# Patient Record
Sex: Female | Born: 1960 | Race: White | Hispanic: No | Marital: Married | Smoking: Current every day smoker
Health system: Southern US, Community
[De-identification: ages and names within clinical notes are randomized; demographics above are authoritative.]

## PROBLEM LIST (undated history)

## (undated) DIAGNOSIS — M1990 Unspecified osteoarthritis, unspecified site: Secondary | ICD-10-CM

## (undated) DIAGNOSIS — D259 Leiomyoma of uterus, unspecified: Secondary | ICD-10-CM

## (undated) DIAGNOSIS — K279 Peptic ulcer, site unspecified, unspecified as acute or chronic, without hemorrhage or perforation: Secondary | ICD-10-CM

## (undated) DIAGNOSIS — K219 Gastro-esophageal reflux disease without esophagitis: Secondary | ICD-10-CM

## (undated) HISTORY — DX: Leiomyoma of uterus, unspecified: D25.9

## (undated) HISTORY — PX: CHOLECYSTECTOMY: SHX55

## (undated) HISTORY — DX: Unspecified osteoarthritis, unspecified site: M19.90

## (undated) HISTORY — DX: Peptic ulcer, site unspecified, unspecified as acute or chronic, without hemorrhage or perforation: K27.9

## (undated) HISTORY — DX: Gastro-esophageal reflux disease without esophagitis: K21.9

## (undated) HISTORY — PX: ABDOMINAL HYSTERECTOMY: SHX81

---

## 2006-04-24 ENCOUNTER — Ambulatory Visit: Payer: Self-pay | Admitting: Family Medicine

## 2006-04-24 DIAGNOSIS — N951 Menopausal and female climacteric states: Secondary | ICD-10-CM | POA: Insufficient documentation

## 2006-04-24 DIAGNOSIS — R5381 Other malaise: Secondary | ICD-10-CM | POA: Insufficient documentation

## 2006-04-24 DIAGNOSIS — N943 Premenstrual tension syndrome: Secondary | ICD-10-CM | POA: Insufficient documentation

## 2006-04-24 DIAGNOSIS — R5383 Other fatigue: Secondary | ICD-10-CM

## 2006-04-24 DIAGNOSIS — M549 Dorsalgia, unspecified: Secondary | ICD-10-CM

## 2006-04-24 DIAGNOSIS — M5442 Lumbago with sciatica, left side: Secondary | ICD-10-CM | POA: Insufficient documentation

## 2006-04-25 ENCOUNTER — Encounter: Payer: Self-pay | Admitting: Family Medicine

## 2006-04-28 LAB — CONVERTED CEMR LAB
Albumin: 4.3 g/dL (ref 3.5–5.2)
Alkaline Phosphatase: 80 units/L (ref 39–117)
BUN: 12 mg/dL (ref 6–23)
Creatinine, Ser: 0.63 mg/dL (ref 0.40–1.20)
Glucose, Bld: 76 mg/dL (ref 70–99)
HCT: 43.4 % (ref 36.0–46.0)
HDL: 52 mg/dL (ref >39)
Hemoglobin: 14.3 g/dL (ref 12.0–15.0)
LDL Cholesterol: 106 mg/dL — ABNORMAL HIGH (ref 0–99)
MCHC: 32.9 g/dL (ref 30.0–36.0)
MCV: 96.4 fL (ref 78.0–100.0)
Potassium: 4.4 meq/L (ref 3.5–5.3)
RBC: 4.5 M/uL (ref 3.87–5.11)
RDW: 13.4 % (ref 11.5–14.0)
Total CHOL/HDL Ratio: 4
Triglycerides: 262 mg/dL — ABNORMAL HIGH (ref <150)

## 2006-07-01 ENCOUNTER — Ambulatory Visit: Payer: Self-pay | Admitting: Family Medicine

## 2006-07-01 DIAGNOSIS — M533 Sacrococcygeal disorders, not elsewhere classified: Secondary | ICD-10-CM | POA: Insufficient documentation

## 2007-03-13 ENCOUNTER — Ambulatory Visit: Payer: Self-pay | Admitting: Family Medicine

## 2008-07-07 ENCOUNTER — Encounter: Payer: Self-pay | Admitting: Family Medicine

## 2008-07-19 ENCOUNTER — Ambulatory Visit: Payer: Self-pay | Admitting: Family Medicine

## 2008-07-19 DIAGNOSIS — E781 Pure hyperglyceridemia: Secondary | ICD-10-CM | POA: Insufficient documentation

## 2008-07-19 DIAGNOSIS — R002 Palpitations: Secondary | ICD-10-CM | POA: Insufficient documentation

## 2008-07-19 LAB — CONVERTED CEMR LAB
Cholesterol, target level: 200 mg/dL
LDL Goal: 160 mg/dL

## 2008-07-21 ENCOUNTER — Telehealth: Payer: Self-pay | Admitting: Family Medicine

## 2008-08-05 ENCOUNTER — Encounter: Payer: Self-pay | Admitting: Family Medicine

## 2008-08-08 ENCOUNTER — Encounter: Payer: Self-pay | Admitting: Family Medicine

## 2008-08-30 ENCOUNTER — Encounter: Payer: Self-pay | Admitting: Family Medicine

## 2008-08-30 LAB — CONVERTED CEMR LAB
Cholesterol: 156 mg/dL
HDL: 29 mg/dL
LDL Cholesterol: 96 mg/dL

## 2008-09-07 DIAGNOSIS — G43009 Migraine without aura, not intractable, without status migrainosus: Secondary | ICD-10-CM | POA: Insufficient documentation

## 2008-09-15 ENCOUNTER — Encounter: Payer: Self-pay | Admitting: Family Medicine

## 2008-09-22 ENCOUNTER — Encounter: Payer: Self-pay | Admitting: Family Medicine

## 2008-10-04 ENCOUNTER — Telehealth: Payer: Self-pay | Admitting: Family Medicine

## 2008-10-05 ENCOUNTER — Encounter: Payer: Self-pay | Admitting: Family Medicine

## 2009-06-21 ENCOUNTER — Encounter: Payer: Self-pay | Admitting: Family Medicine

## 2009-08-24 ENCOUNTER — Ambulatory Visit: Payer: Self-pay | Admitting: Family Medicine

## 2009-08-24 DIAGNOSIS — G43829 Menstrual migraine, not intractable, without status migrainosus: Secondary | ICD-10-CM | POA: Insufficient documentation

## 2009-08-24 DIAGNOSIS — R062 Wheezing: Secondary | ICD-10-CM | POA: Insufficient documentation

## 2009-08-24 DIAGNOSIS — F17200 Nicotine dependence, unspecified, uncomplicated: Secondary | ICD-10-CM | POA: Insufficient documentation

## 2010-03-22 ENCOUNTER — Encounter: Payer: Self-pay | Admitting: Family Medicine

## 2010-03-23 ENCOUNTER — Encounter: Payer: Self-pay | Admitting: Family Medicine

## 2010-03-23 LAB — CONVERTED CEMR LAB: Pap Smear: NORMAL

## 2010-04-24 NOTE — Letter (Signed)
Summary: [facility]   Imported By: Lanelle Bal 06/29/2009 12:37:34  _____________________________________________________________________  External Attachment:    Type:   Image     Comment:   External Document

## 2010-04-24 NOTE — Assessment & Plan Note (Signed)
Summary: menstrual migraine   Vital Signs:  Patient profile:   50 year old female Height:      64.5 inches Weight:      162 pounds BMI:     27.48 O2 Sat:      100 % on Room air Pulse rate:   63 / minute BP sitting:   108 / 74  (left arm) Cuff size:   large  Vitals Entered By: Payton Spark CMA (August 24, 2009 8:34 AM)  O2 Flow:  Room air  Serial Vital Signs/Assessments:  Comments: Peak Flow [address] By: Payton Spark CMA   CC: F/U HA    Primary Care Provider:  Nani Gasser MD  CC:  F/U HA .  History of Present Illness: 50 yo WF presents for f/u menstrual migraines that really just started 2 yrs ago.  She has been seeing Dr Neale Burly at the [facility] in [place].  She was started on Maxalt for rescue.  She went back last month.  She was started on Topamax for prophylaxis.  He wants to get her off Maxalt because she smokes.  She started to feel bad on the Topamax.  She had 1 round of trigger point injections with lidocaine and decadron.  She goes back in 10 days.  She is just starting perimenopause.  She did not need to use her Maxalt with her period this month.  She did have wheezing on her lung exam last month.  She did have a cold in March.  She is a long time smoker.      Current Medications (verified): 1)  Multivitamins  Tabs (Multiple Vitamin) .... Take 1 Tablet By Mouth Once A Day 2)  Colace 100 Mg Caps (Docusate Sodium) .... Two By Mouth Daily 3)  Metamucil  Caps (Psyllium Caps) .... Two By Mouth Daily 4)  Vita-Plus E 400 Unit Caps (Vitamin E) .... Take 1 Tablet By Mouth Once A Day 5)  Vitamin C 1000 Mg Tabs (Ascorbic Acid) .... Take 1 Tablet By Mouth Once A Day 6)  Ra Cod Liver Oil 1250-133 Unit Caps (Vitamins A & D) .... Take 1 Tablet By Mouth Once A Day 7)  Vitamin B-6 100 Mg Tabs (Pyridoxine Hcl) .... Take 1 Tablet By Mouth Once A Day 8)  Flexeril 10 Mg Tabs (Cyclobenzaprine Hcl) .... One By Mouth At Night As Needed Back Pain. 9)  Prozac 20 Mg  Caps (Fluoxetine Hcl) .... Take 1 Tab By Mouth Once Daily 10)  Topamax 25 Mg Tabs (Topiramate) .... Take 3 Tabs By Mouth Daily 11)  Maxalt 10 Mg Tabs (Rizatriptan Benzoate) .... Take As Directed As Needed  Allergies (verified): No Known Drug Allergies  Past History:  Past Medical History: menstrual migraines - Dr Santiago Glad smoker  Social History: Reviewed history from 04/24/2006 and no changes required. 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  13 yrs education.  Married to Brain with 5 kids.   Current Smoker Alcohol use-yes Drug use-no Regular exercise-no  Review of Systems      See HPI  Physical Exam  General:  alert, well-developed, well-nourished, and well-hydrated.   Head:  normocephalic and atraumatic.   Eyes:  conjunctiva clear, PERRLA Nose:  no nasal discharge.   Mouth:  pharynx pink and moist.   Neck:  no masses.   Lungs:  Normal respiratory effort, chest expands symmetrically. Lungs are clear to auscultation, no crackles or wheezes.  + forced exp wheezing bibasilar Heart:  Normal rate  and regular rhythm. S1 and S2 normal without gallop, murmur, click, rub or other extra sounds. Skin:  color normal.   Cervical Nodes:  No lymphadenopathy noted   Impression & Recommendations:  Problem # 1:  MIGRAINE, MENSTRUAL (ICD-346.40) Assessment Improved She is to continue seeing Dr Neale Burly for next round of trigger point injections and continue on Topamax for prophylaxis.  She would be able to stay on Maxalt for rescue if she quits smoking according to Dr Neale Burly.   Her updated medication list for this problem includes:    Maxalt 10 Mg Tabs (Rizatriptan benzoate) .Marland Kitchen... Take as directed as needed  Problem # 2:  WHEEZING (ICD-786.07)  She has clear lungs on exam today but I did recommend that she quit smoking and use an Albuterol HFA as needed.   Orders: Peak Flow Rate (94150)  Problem # 3:  CIGARETTE SMOKER (ICD-305.1)  Her updated medication list for this  problem includes:    Chantix Starting Month Pak 0.5 Mg X 11 & 1 Mg X 42 Tabs (Varenicline tartrate) .Marland Kitchen... Take as directed  Complete Medication List: 1)  Multivitamins Tabs (Multiple vitamin) .... Take 1 tablet by mouth once a day 2)  Colace 100 Mg Caps (Docusate sodium) .... Two by mouth daily 3)  Metamucil Caps (Psyllium caps) .... Two by mouth daily 4)  Vita-plus E 400 Unit Caps (Vitamin e) .... Take 1 tablet by mouth once a day 5)  Vitamin C 1000 Mg Tabs (Ascorbic acid) .... Take 1 tablet by mouth once a day 6)  Ra Cod Liver Oil 1250-133 Unit Caps (Vitamins a & d) .... Take 1 tablet by mouth once a day 7)  Vitamin B-6 100 Mg Tabs (Pyridoxine hcl) .... Take 1 tablet by mouth once a day 8)  Flexeril 10 Mg Tabs (Cyclobenzaprine hcl) .... One by mouth at night as needed back pain. 9)  Prozac 20 Mg Caps (Fluoxetine hcl) .... Take 1 tab by mouth once daily 10)  Topamax 25 Mg Tabs (Topiramate) .... 3 tabs by mouth at bedtime 11)  Maxalt 10 Mg Tabs (Rizatriptan benzoate) .... Take as directed as needed 12)  Chantix Starting Month Pak 0.5 Mg X 11 & 1 Mg X 42 Tabs (Varenicline tartrate) .... Take as directed 13)  Proair Hfa 108 (90 Base) Mcg/act Aers (Albuterol sulfate) .... 2 puffs q 4 hrs prn  Patient Instructions: 1)  F/U with Dr Neale Burly for trigger point injections. 2)  Stay on Topamax at bedtime. 3)  Use Maxalt for rescue. 4)  You may want to take Aleve for menstrual cramps right at onset. 5)  Work on smoking cessation. 6)  Start Chantix 1 wk before your quit date. 7)  Use ProAir inhaler AS needed for wheezing, shortness of breathe. 8)  REturn for f/u smoking cessation in 3 mos. Prescriptions: CHANTIX STARTING MONTH PAK 0.5 MG X 11 & 1 MG X 42 TABS (VARENICLINE TARTRATE) take as directed  #1 pack x 0   Entered and Authorized by:   Seymour Bars DO   Signed by:   Seymour Bars DO on 08/24/2009   Method used:   Printed then faxed to ...       [facility]  [place] [phone]* (retail)       [address] [place], [place]  [address]       Ph: [phone] or [phone]       Fax: [phone]   RxID:   [id number]  PROAIR HFA 108 (90 BASE) MCG/ACT AERS (ALBUTEROL SULFATE) 2 puffs q 4 hrs prn  #1 x 0   Entered and Authorized by:   Seymour Bars DO   Signed by:   Seymour Bars DO on 08/24/2009   Method used:   Printed then faxed to ...       [facility]  [place] [phone][facility] (retail)       [address] [place], [place]  [address]       Ph: [phone] or [phone]       Fax: [phone]   RxID:   [id number] CHANTIX STARTING MONTH PAK 0.5 MG X 11 & 1 MG X 42 TABS (VARENICLINE TARTRATE) take as directed  #1 pack x 0   Entered and Authorized by:   Seymour Bars DO   Signed by:   Seymour Bars DO on 08/24/2009   Method used:   Electronically to        [facility] [phone]* (retail)       [address] [place], [place]  [address]       Ph: [phone] or [phone]       Fax: [phone]   RxID:   [id number]

## 2010-04-26 NOTE — Miscellaneous (Signed)
Summary: pap  Clinical Lists Changes  Observations: Added new observation of PAP DUE: 03/2012 (03/23/2010 15:49) Added new observation of PAP SMEAR: Normal (03/23/2010 15:49)    Pap smear normal - to return for  PAP in two years.[id number]

## 2010-04-26 NOTE — Letter (Signed)
Summary: [facility]  [facility]   Imported By: Lanelle Bal 04/04/2010 09:01:02  _____________________________________________________________________  External Attachment:    Type:   Image     Comment:   External Document

## 2011-05-20 ENCOUNTER — Encounter: Payer: Self-pay | Admitting: Physician Assistant

## 2011-05-20 ENCOUNTER — Ambulatory Visit (INDEPENDENT_AMBULATORY_CARE_PROVIDER_SITE_OTHER): Payer: BC Managed Care – PPO | Admitting: Physician Assistant

## 2011-05-20 VITALS — BP 115/77 | HR 73 | Temp 98.0°F | Ht 64.0 in | Wt 161.0 lb

## 2011-05-20 DIAGNOSIS — R1013 Epigastric pain: Secondary | ICD-10-CM

## 2011-05-20 NOTE — Progress Notes (Signed)
  Subjective:    Patient ID: Anne Mcdonald, female    DOB: 11/10/1960, 51 y.o.   MRN: [id number]  HPI Patient presents to clinic with abdominal pain. At first is was off and on for like 2 weeks and not very bad. 7 days ago it started to be more contstant and the pain was worse. She described the pain as burning dull 6/10 pain and almost like it's in between her breast. She has noticed that she has been burping more than usual but has not noticed any bad taste in her mouth. It has been better when she took Zantac but does not make it go away. Her bowel movements are been hard lately and she has to take a stool softener. She denies blood in stool. She does not have her gallbladder it was removed. She has not had pain like this before or ever been diagnosed with acid reflux.    Review of Systems     Objective:   Physical Exam  Constitutional: She is oriented to person, place, and time. She appears well-developed and well-nourished.  HENT:  Head: Normocephalic and atraumatic.  Cardiovascular: Normal rate, regular rhythm and normal heart sounds.   Pulmonary/Chest: Effort normal and breath sounds normal.  Abdominal:       Abdomen soft with normal bowel sounds. Tenderness to deep palpation in epigastric area. No other tenderness. No organomegly.   Neurological: She is alert and oriented to person, place, and time.  Psychiatric: She has a normal mood and affect. Her behavior is normal.          Assessment & Plan:  Epigastric pain- Suspect GERD will treat with Dexilant samples to see if pain and discomfort improve. Instructed to avoid smoking, alcohol,NSAIDs, spicy foods and caffeine. Patient is to call office if not improving or when need prescription called in if working. Discuss options to quit smoking. Patient reports she is not ready. Talked about the possibilty of ulcer. If pain continues we could consider testing for h.pylori.  We discussed need for CPE, Pap, Tdap and other health  maintence items.

## 2011-05-20 NOTE — Patient Instructions (Signed)
Star Dexilant daily. Gave samples. Call when get low and can call in refill. See handouts and limit smoking, avoid caffiene and alcohol. STay away from anti-inflammatories.   Gastroesophageal Reflux Disease, Adult Gastroesophageal reflux disease (GERD) happens when acid from your stomach flows up into the esophagus. When acid comes in contact with the esophagus, the acid causes soreness (inflammation) in the esophagus. Over time, GERD may create small holes (ulcers) in the lining of the esophagus. CAUSES   Increased body weight. This puts pressure on the stomach, making acid rise from the stomach into the esophagus.   Smoking. This increases acid production in the stomach.   Drinking alcohol. This causes decreased pressure in the lower esophageal sphincter (valve or ring of muscle between the esophagus and stomach), allowing acid from the stomach into the esophagus.   Late evening meals and a full stomach. This increases pressure and acid production in the stomach.   A malformed lower esophageal sphincter.  Sometimes, no cause is found. SYMPTOMS   Burning pain in the lower part of the mid-chest behind the breastbone and in the mid-stomach area. This may occur twice a week or more often.   Trouble swallowing.   Sore throat.   Dry cough.   Asthma-like symptoms including chest tightness, shortness of breath, or wheezing.  DIAGNOSIS  Your caregiver may be able to diagnose GERD based on your symptoms. In some cases, X-rays and other tests may be done to check for complications or to check the condition of your stomach and esophagus. TREATMENT  Your caregiver may recommend over-the-counter or prescription medicines to help decrease acid production. Ask your caregiver before starting or adding any new medicines.  HOME CARE INSTRUCTIONS   Change the factors that you can control. Ask your caregiver for guidance concerning weight loss, quitting smoking, and alcohol consumption.   Avoid foods  and drinks that make your symptoms worse, such as:   Caffeine or alcoholic drinks.   Chocolate.   Peppermint or mint flavorings.   Garlic and onions.   Spicy foods.   Citrus fruits, such as oranges, lemons, or limes.   Tomato-based foods such as sauce, chili, salsa, and pizza.   Fried and fatty foods.   Avoid lying down for the 3 hours prior to your bedtime or prior to taking a nap.   Eat small, frequent meals instead of large meals.   Wear loose-fitting clothing. Do not wear anything tight around your waist that causes pressure on your stomach.   Raise the head of your bed 6 to 8 inches with wood blocks to help you sleep. Extra pillows will not help.   Only take over-the-counter or prescription medicines for pain, discomfort, or fever as directed by your caregiver.   Do not take aspirin, ibuprofen, or other nonsteroidal anti-inflammatory drugs (NSAIDs).  SEEK IMMEDIATE MEDICAL CARE IF:   You have pain in your arms, neck, jaw, teeth, or back.   Your pain increases or changes in intensity or duration.   You develop nausea, vomiting, or sweating (diaphoresis).   You develop shortness of breath, or you faint.   Your vomit is green, yellow, black, or looks like coffee grounds or blood.   Your stool is red, bloody, or black.  These symptoms could be signs of other problems, such as heart disease, gastric bleeding, or esophageal bleeding. MAKE SURE YOU:   Understand these instructions.   Will watch your condition.   Will get help right away if you are not doing well  or get worse.  Document Released: 12/19/2004 Document Revised: 11/21/2010 Document Reviewed: 09/28/2010 [facility] Patient Information 2012 [place], [place]  .Ulcer Disease You have an ulcer. This may be in your stomach (gastric ulcer) or in the first part of your small bowel, the duodenum (duodenal ulcer). An ulcer is a break in the lining of the stomach or duodenum. The ulcer causes erosion into the deeper  tissue. CAUSES  The stomach has a lining to protect itself from the acid that digests food. The lining can be damaged in two main ways:  The Helico Pylori bacteria (H. Pyolori) can infect the lining of the stomach and cause ulcers.   Nonsteroidal, anti-inflammatory medications (NSAIDS) can cause gastric ulcerations.   Smoking tobacco can increase the acid in the stomach. This can lead to ulcers, and will impair healing of ulcers.  Other factors, such as alcohol use and stress may contribute to ulcer formation. Rarely, a tumor or cancer can cause an ulcer.  SYMPTOMS  The problems (symptoms) of ulcer disease are usually a burning or gnawing of the mid-upper belly (abdomen). This is often worse on an empty stomach and may get better with food. This may be associated with feeling sick to your stomach (nausea), bloating, and vomiting. If the ulcer results in bleeding, it can cause:  Black, tarry stools.   Vomiting of bright red blood.   Vomiting coffee-ground-looking materials.  With severe bleeding, there may be loss of consciousness and shock.  DIAGNOSIS  Learning what is wrong (diagnosis) is usually made based upon your history and an exam. Medications are so effective that further tests may not be necessary. If needed, other tests may include:  Blood tests, x-rays, or heart tests. These are done to be sure that no other conditions are causing your symptoms.   X-rays (Barium studies) such as an upper GI series.   Most commonly, an upper GI endoscopy can confirm your diagnosis. This is when a flexible tube is passed through the mouth (using sedative medication). The tube is used to look at the inside of esophagus, stomach, and small bowel. Abnormal pieces of tissue may be removed to examine under the microscope (biopsy).  TREATMENT  Bleeding from ulcers can usually be treated via endoscopy. Rarely, surgery is needed for ulcers when bleeding cannot be stopped. Surgery is needed if the ulcer  goes through the wall of the stomach or duodenum.  After any bleeding is stopped, medications are the main treatment:  If your ulcer was caused by the bacteria H. Pylori, then you will need antibiotics to kill the infection. Usually, a program involving more than one antibiotic, and a medicine for stomach acid, is prescribed.   Medicine to decrease acid production is used for almost all ulcers. Your caregiver will make a recommendation. They will also tell you how long you must use the medication.   Stop using any medications or substances that may have contributed to your ulcer (alcohol, tobacco, caffeine, for example).   Medications are available that protect the lining of the bowel.  HOME CARE INSTRUCTIONS   Continue regular work and usual activities unless advised otherwise by your caregiver.   Avoid tobacco, alcohol, and caffeine. Tobacco use will decrease and slow the rates of healing.   Avoid foods that seem to aggravate or cause discomfort.   There are many over-the-counter products available to control stomach acid and other symptoms. Discuss these with your caregiver before using them. DO NOT substitute over-the-counter medications for prescription medications without discussing  with your caregiver.   Special diets are not usually needed.   Keep any follow-up appointments and blood tests, as directed.  SEEK MEDICAL CARE IF:   Your pain or other ulcer symptoms do not improve within a few days of starting treatment.   You develop diarrhea. This can be a complication of certain treatments.   You have ongoing indigestion or heartburn, even if your main ulcer symptoms are improved.  SEEK IMMEDIATE MEDICAL CARE IF:   You develop bright red, rectal bleeding; dark black, tarry stools; or vomit blood.   You become light-headed, weak, have fainting episodes, or become sweaty, cold and clammy.   You experience severe abdominal pain not controlled by medications. DO NOT take pain  medications unless ordered by your caregiver.  Document Released: 12/19/2004 Document Revised: 11/21/2010 Document Reviewed: 11/06/2006 [facility] Patient Information 2012 [place], [place].

## 2011-05-24 ENCOUNTER — Encounter: Payer: Self-pay | Admitting: Physician Assistant

## 2011-05-24 ENCOUNTER — Ambulatory Visit (INDEPENDENT_AMBULATORY_CARE_PROVIDER_SITE_OTHER): Payer: BC Managed Care – PPO | Admitting: Physician Assistant

## 2011-05-24 ENCOUNTER — Other Ambulatory Visit: Payer: Self-pay | Admitting: Physician Assistant

## 2011-05-24 VITALS — BP 103/71 | HR 65 | Temp 98.2°F | Ht 64.0 in | Wt 158.0 lb

## 2011-05-24 DIAGNOSIS — R5383 Other fatigue: Secondary | ICD-10-CM

## 2011-05-24 DIAGNOSIS — R5381 Other malaise: Secondary | ICD-10-CM

## 2011-05-24 DIAGNOSIS — R1013 Epigastric pain: Secondary | ICD-10-CM

## 2011-05-24 LAB — CBC WITH DIFFERENTIAL/PLATELET
Basophils Absolute: 0 10*3/uL (ref 0.0–0.1)
Eosinophils Relative: 2 % (ref 0–5)
HCT: 40.6 % (ref 36.0–46.0)
Hemoglobin: 13.8 g/dL (ref 12.0–15.0)
Lymphocytes Relative: 32 % (ref 12–46)
MCV: 95.8 fL (ref 78.0–100.0)
Monocytes Absolute: 0.6 10*3/uL (ref 0.1–1.0)
Monocytes Relative: 6 % (ref 3–12)
RDW: 12.9 % (ref 11.5–15.5)
WBC: 9.4 10*3/uL (ref 4.0–10.5)

## 2011-05-24 MED ORDER — SUCRALFATE 1 G PO TABS: 1.0000 g | ORAL_TABLET | Freq: Four times a day (QID) | ORAL | Status: DC

## 2011-05-24 NOTE — Progress Notes (Signed)
  Subjective:    Patient ID: Anne Mcdonald, female    DOB: Aug 31, 1960, 51 y.o.   MRN: [id number]  HPI Patient continues to have epigastric pain. She denies any nausea or vomiting. She feels like she can't eat but last night she ate and the pain was better for a little bit. She has tried to avoid spicy foods and caffiene but still smokes. She describes the pain as contstant 6/10 like someone punching her in the stomach. Nothing has made it worse. She has taken dexilant but doesn't seem to help. She is very worn out and tired. She doesn't feel like getting out of bed.   Review of Systems     Objective:   Physical Exam  Constitutional: She is oriented to person, place, and time. She appears well-developed and well-nourished.  Cardiovascular: Normal rate, regular rhythm and normal heart sounds.   Pulmonary/Chest: Effort normal and breath sounds normal.  Abdominal: Soft. Bowel sounds are normal. There is tenderness.       Epigastric pain with deep palpation.   Neurological: She is alert and oriented to person, place, and time.  Psychiatric: She has a normal mood and affect. Her behavior is normal.          Assessment & Plan:  Epigastric pain/Fatigue- Lipase,CBC, h.pylori ordered. Will call with results. If normal will order a CT scan of abdomen. Carafate was added to Dexilant daily. Tylenol for pain. Patient cannot tolerate nacotics.

## 2011-05-24 NOTE — Patient Instructions (Signed)
Take Carafate four times a day along with the dexilant once a day. Get labs drawn. Will call Monday with results. Tylenol for abdominal pain.

## 2011-05-27 ENCOUNTER — Other Ambulatory Visit: Payer: Self-pay | Admitting: Physician Assistant

## 2011-05-27 DIAGNOSIS — R1013 Epigastric pain: Secondary | ICD-10-CM

## 2011-05-29 ENCOUNTER — Telehealth: Payer: Self-pay | Admitting: *Deleted

## 2011-05-29 NOTE — Telephone Encounter (Addendum)
[facility] wants to know if you want the CT of abd or abd and pelvis. What do you want to rule out?

## 2011-05-29 NOTE — Telephone Encounter (Signed)
CT of abdomen only. Want to evaluate for any intestinal/stomach masses causing epigastric pain.

## 2011-05-30 NOTE — Telephone Encounter (Signed)
Informed by [facility] that pt cancelled CT scan stating that she would do it at a later date.

## 2011-05-31 ENCOUNTER — Other Ambulatory Visit: Payer: BC Managed Care – PPO

## 2011-06-03 ENCOUNTER — Telehealth: Payer: Self-pay | Admitting: *Deleted

## 2011-06-03 MED ORDER — OMEPRAZOLE 20 MG PO CPDR: 20.0000 mg | DELAYED_RELEASE_CAPSULE | Freq: Every day | ORAL | Status: DC

## 2011-06-03 NOTE — Telephone Encounter (Signed)
Husband informed

## 2011-06-03 NOTE — Telephone Encounter (Signed)
Pt states that she needs a refill on Dexilant. What dose needs to be sent to the pharmacy? please advise.

## 2011-06-03 NOTE — Telephone Encounter (Signed)
Dexilant is really expensive and insurance will not pay for it unless you try and fail omeprazole. Sent to pharmacy. Call patient and let her know.

## 2012-02-11 ENCOUNTER — Encounter (INDEPENDENT_AMBULATORY_CARE_PROVIDER_SITE_OTHER): Payer: BC Managed Care – PPO | Admitting: Family Medicine

## 2012-02-11 NOTE — Progress Notes (Signed)
  Subjective:    Patient ID: Anne Mcdonald, female    DOB: [birth year], 51 y.o.   MRN: [id number]  HPI    Review of Systems     Objective:   Physical Exam        Assessment & Plan:

## 2012-03-11 ENCOUNTER — Telehealth: Payer: Self-pay | Admitting: *Deleted

## 2012-03-11 ENCOUNTER — Emergency Department (INDEPENDENT_AMBULATORY_CARE_PROVIDER_SITE_OTHER): Payer: BC Managed Care – PPO

## 2012-03-11 ENCOUNTER — Encounter: Payer: Self-pay | Admitting: *Deleted

## 2012-03-11 ENCOUNTER — Emergency Department
Admission: EM | Admit: 2012-03-11 | Discharge: 2012-03-11 | Disposition: A | Discharge status: Home / Self Care | Payer: BC Managed Care – PPO | Source: Home / Self Care | Attending: Family Medicine | Admitting: Family Medicine

## 2012-03-11 DIAGNOSIS — R0602 Shortness of breath: Secondary | ICD-10-CM

## 2012-03-11 DIAGNOSIS — R05 Cough: Secondary | ICD-10-CM

## 2012-03-11 DIAGNOSIS — F17200 Nicotine dependence, unspecified, uncomplicated: Secondary | ICD-10-CM

## 2012-03-11 DIAGNOSIS — R0989 Other specified symptoms and signs involving the circulatory and respiratory systems: Secondary | ICD-10-CM

## 2012-03-11 DIAGNOSIS — J069 Acute upper respiratory infection, unspecified: Secondary | ICD-10-CM

## 2012-03-11 DIAGNOSIS — Z716 Tobacco abuse counseling: Secondary | ICD-10-CM

## 2012-03-11 DIAGNOSIS — R059 Cough, unspecified: Secondary | ICD-10-CM

## 2012-03-11 MED ORDER — PREDNISONE 50 MG PO TABS: ORAL_TABLET | ORAL | Status: DC

## 2012-03-11 MED ORDER — METHYLPREDNISOLONE ACETATE 80 MG/ML IJ SUSP
80.0000 mg | Freq: Once | INTRAMUSCULAR | Status: AC
  Administered 2012-03-11: 80 mg via INTRAMUSCULAR

## 2012-03-11 MED ORDER — AZITHROMYCIN 250 MG PO TABS: ORAL_TABLET | ORAL | Status: DC

## 2012-03-11 NOTE — ED Notes (Signed)
Pt c/o cough, sore throat, HA, fatigue and night sweats.

## 2012-03-11 NOTE — ED Provider Notes (Signed)
History     CSN: [id number]  Arrival date & time 03/11/12  1529   First MD Initiated Contact with Patient 03/11/12 1530      No chief complaint on file.  HPI  URI Symptoms Onset: 7 days  Description: generalized malaise,rhinorrhea, nasal congestion, diarrhea, cough, new onset SOB. No chest pain  Modifying factors:  1/2 PPD smoker; 30 pack year smoking history. Pt also reports some mild bilateral calf pain. L>R. Pt states that she stands on her feet all day and has also noticed this over the last   Symptoms Nasal discharge: yes Fever: no Sore throat: resolved  Cough: yes Wheezing: no  Ear pain: no GI symptoms: mild nausea  Sick contacts: yes; at work   [employer]  Stiff neck: no Dyspnea: yes Rash: no Swallowing difficulty: no  Sinusitis Risk Factors Headache/face pain: no Double sickening: no tooth pain: no  Allergy Risk Factors Sneezing: no Itchy scratchy throat: no Seasonal symptoms: no  Flu Risk Factors Headache: no muscle aches: mild severe fatigue: no   No past medical history on file.  No past surgical history on file.  No family history on file.  History  Substance Use Topics  . Smoking status: Current Every Day Smoker -- 1.0 packs/day    Types: Cigarettes  . Smokeless tobacco: Not on file  . Alcohol Use: Not on file    OB History    Grav Para Term Preterm Abortions TAB SAB Ect Mult Living                  Review of Systems  All other systems reviewed and are negative.    Allergies  Review of patient's allergies indicates no known allergies.  Home Medications   Current Outpatient Rx  Name  Route  Sig  Dispense  Refill  . FLUOXETINE HCL 20 MG PO CAPS   Oral   Take 20 mg by mouth daily.         Marland Kitchen METOPROLOL SUCCINATE ER 25 MG PO TB24   Oral   Take 12.5 mg by mouth daily.         Marland Kitchen OMEPRAZOLE 20 MG PO CPDR   Oral   Take 1 capsule (20 mg total) by mouth daily.   30 capsule   5   . SUCRALFATE 1 G PO TABS   Oral  Take 1 tablet (1 g total) by mouth 4 (four) times daily.   120 tablet   1   . TOPIRAMATE 100 MG PO TABS   Oral   Take 100 mg by mouth daily.           There were no vitals taken for this visit.  Physical Exam  Vitals reviewed. Constitutional: She appears well-developed and well-nourished.  HENT:  Head: Normocephalic and atraumatic.  Right Ear: External ear normal.  Left Ear: External ear normal.       +nasal erythema, rhinorrhea bilaterally, + post oropharyngeal erythema    Eyes: Conjunctivae normal are normal. Pupils are equal, round, and reactive to light.  Neck: Normal range of motion. Neck supple.  Cardiovascular: Normal rate, regular rhythm and normal heart sounds.   Pulmonary/Chest: Effort normal and breath sounds normal.  Abdominal: Soft.  Musculoskeletal: Normal range of motion.       Calves symmetric.  No popliteal tenderness  Lymphadenopathy:    She has no cervical adenopathy.  Neurological: She is alert.    ED Course  Procedures (including critical care time)  Labs Reviewed -  No data to display Dg Chest 2 View  03/11/2012  *RADIOLOGY REPORT*  Clinical Data: Cough and congestion.  Short of breath  CHEST - 2 VIEW  Comparison: None  Findings: Lungs are clear without infiltrate or effusion.  Heart size is normal and there is no heart failure or mass lesion.  IMPRESSION: Negative   Original Report Authenticated By: Janeece Riggers, M.D.   EKG: normal EKG, normal sinus rhythm.    1. URI (upper respiratory infection)   2. Tobacco abuse counseling       MDM  Suspect underlying viral process with secondary mild obstructive lung disease exacerbation.  Depomedrol 80mg  IM x1. CXR negative for focal infiltrate.  EKG NSR.  Rapid flu negative.  Pt does report some mild sxs of DVT with LE tenderness, though more diffuse than true popliteal tenderness/swelling as well as having no popliteal tenderness on my exam today or asymmtery. Wells score 0-3.  Will check  D-dimer.  If positive, proceed to LE dopplers and CTA.  General infectious and resp red flags reviewed.      The patient and/or caregiver has been counseled thoroughly with regard to treatment plan and/or medications prescribed including dosage, schedule, interactions, rationale for use, and possible side effects and they verbalize understanding. Diagnoses and expected course of recovery discussed and will return if not improved as expected or if the condition worsens. Patient and/or caregiver verbalized understanding.                Doree Albee, MD 03/12/12 [phone]

## 2012-04-14 ENCOUNTER — Encounter: Payer: Self-pay | Admitting: Family Medicine

## 2012-04-14 ENCOUNTER — Ambulatory Visit (INDEPENDENT_AMBULATORY_CARE_PROVIDER_SITE_OTHER): Payer: BC Managed Care – PPO | Admitting: Family Medicine

## 2012-04-14 VITALS — BP 99/64 | HR 85 | Temp 98.3°F | Wt 151.0 lb

## 2012-04-14 DIAGNOSIS — R1013 Epigastric pain: Secondary | ICD-10-CM

## 2012-04-14 MED ORDER — OMEPRAZOLE 40 MG PO CPDR: 40.0000 mg | DELAYED_RELEASE_CAPSULE | Freq: Every day | ORAL | Status: DC

## 2012-04-14 NOTE — Progress Notes (Signed)
CC: Anne Mcdonald is a 52 y.o. female is here for stomach issues   Subjective: HPI:  Patient reports epigastric pain. He has been present for one to 2 weeks. Becoming more noticeable on a daily basis. Described as mild in severity. Worse with an empty stomach, improved significantly with eating. It does not seem to be related to bowel movements. When pain is at its worst she has some mild nausea. Pain can occur anytime of the day depending on eating habits. She describes the pain as a burning that is nonradiating. She denies fevers, chills, vomiting, dysphagia, regurgitation, pain awakening from sleep, right upper quadrant pain, constipation, diarrhea, back pain. Denies chest pain rapid heart beat nor shortness of breath. Denies tar colored stools Interestingly she states that this pain is identical to that which responded tremendously to omeprazole, she's been out of omeprazole for 3-4 weeks.. she does not use nonsteroidal anti-inflammatories in excess, she is a smoker, rare alcohol use   Review Of Systems Outlined In HPI  Past Medical History  Diagnosis Date  . PUD (peptic ulcer disease)      No family history on file.   History  Substance Use Topics  . Smoking status: Current Every Day Smoker -- 1.0 packs/day    Types: Cigarettes  . Smokeless tobacco: Not on file  . Alcohol Use: Yes     Objective: Filed Vitals:   04/14/12 1621  BP: 99/64  Pulse: 85  Temp: 98.3 F (36.8 C)    General: Alert and Oriented, No Acute Distress HEENT: Pupils equal, round, reactive to light. Conjunctivae clear.  Moist mucous membranes,  Lungs: Clear to auscultation bilaterally, no wheezing/ronchi/rales.  Comfortable work of breathing. Good air movement. Cardiac: Regular rate and rhythm. Normal S1/S2.  No murmurs, rubs, nor gallops.   Abdomen: Mild epigastric pain to palpation, no right upper quadrant pain, no right lower nor left lower quadrant nor left upper quadrant pain. Normal bowel sounds  and no palpable masses Extremities: No peripheral edema.  Strong peripheral pulses.  Mental Status: No depression, anxiety, nor agitation. Skin: Warm and dry.  Assessment & Plan: Anne Mcdonald was seen today for stomach issues.  Diagnoses and associated orders for this visit:  Epigastric pain - omeprazole (PRILOSEC) 40 MG capsule; Take 1 capsule (40 mg total) by mouth daily.    Epigastric pain: [name] a self-reported history of peptic ulcer disease. Symptoms are suggestive of gastritis or recurrent peptic ulcer disease, H. Pylori screen last year was negative, will resume omeprazole at full dose and we'll withhold further testing in less not improved in one to 2 weeks.  Return in about 2 weeks (around 04/28/2012).

## 2012-05-18 ENCOUNTER — Encounter: Payer: Self-pay | Admitting: *Deleted

## 2012-05-21 ENCOUNTER — Other Ambulatory Visit: Payer: Self-pay | Admitting: Physician Assistant

## 2012-06-10 ENCOUNTER — Ambulatory Visit (INDEPENDENT_AMBULATORY_CARE_PROVIDER_SITE_OTHER): Payer: BC Managed Care – PPO

## 2012-06-10 ENCOUNTER — Ambulatory Visit (INDEPENDENT_AMBULATORY_CARE_PROVIDER_SITE_OTHER): Payer: BC Managed Care – PPO | Admitting: Family Medicine

## 2012-06-10 ENCOUNTER — Encounter: Payer: Self-pay | Admitting: Family Medicine

## 2012-06-10 VITALS — BP 99/63 | HR 68 | Ht 64.0 in | Wt 156.0 lb

## 2012-06-10 DIAGNOSIS — R0789 Other chest pain: Secondary | ICD-10-CM

## 2012-06-10 DIAGNOSIS — R079 Chest pain, unspecified: Secondary | ICD-10-CM

## 2012-06-10 DIAGNOSIS — R002 Palpitations: Secondary | ICD-10-CM

## 2012-06-10 DIAGNOSIS — I471 Supraventricular tachycardia: Secondary | ICD-10-CM | POA: Insufficient documentation

## 2012-06-10 DIAGNOSIS — R05 Cough: Secondary | ICD-10-CM

## 2012-06-10 DIAGNOSIS — R059 Cough, unspecified: Secondary | ICD-10-CM

## 2012-06-10 DIAGNOSIS — J209 Acute bronchitis, unspecified: Secondary | ICD-10-CM

## 2012-06-10 NOTE — Progress Notes (Signed)
Subjective:    Patient ID: Anne Mcdonald, female    DOB: 22-Aug-1960, 52 y.o.   MRN: [id number]  HPI Starting in late December the had a virus and had her periods and felt her heart was doing funny things.  Went to UC and was put on ABX and got better and heart settled down. Then started having palpitations. [facility] upped her topamax to 200mg  around that time. Also upped her omeprazole to 40mg  as well.   Says heart races and then pounding. Says it seemed to be worse at night when she would lay down. She then cut out her caffiene but not change.  Called her cards in early March but they couldn't see her until 3/27, so they ordered heart monitor. She did have some episodes with the monitor on. History of SVT seen on previous event recorder in 2010.  URI x 2 days.  No fever.  Some cough and mild ST.  NO NASAL congestion. Productive cough this AM.  + sick contacts. Chest feels tight.  No cough cold meds.   Review of Systems No SOB    BP 99/63  Pulse 68  Ht 5\' 4"  (1.626 m)  Wt 156 lb (70.761 kg)  BMI 26.76 kg/m2  SpO2 98%  LMP 05/20/2012    No Known Allergies  Past Medical History  Diagnosis Date  . PUD (peptic ulcer disease)     Past Surgical History  Procedure Laterality Date  . Cholecystectomy    . Cesarean section      History   Social History  . Marital Status: Married    Spouse Name: N/A    Number of Children: N/A  . Years of Education: N/A   Occupational History  . Not on file.   Social History Main Topics  . Smoking status: Current Every Day Smoker -- 1.00 packs/day    Types: Cigarettes  . Smokeless tobacco: Not on file  . Alcohol Use: Yes  . Drug Use: No  . Sexually Active: Not on file   Other Topics Concern  . Not on file   Social History Narrative  . No narrative on file    No family history on file.  Outpatient Encounter Prescriptions as of 06/10/2012  Medication Sig Dispense Refill  . FLUoxetine (PROZAC) 20 MG capsule Take 20 mg by  mouth daily.      . metoprolol succinate (TOPROL-XL) 25 MG 24 hr tablet Take 12.5 mg by mouth daily.      Marland Kitchen omeprazole (PRILOSEC) 40 MG capsule Take 1 capsule (40 mg total) by mouth daily.  30 capsule  3  . topiramate (TOPAMAX) 100 MG tablet Take 100 mg by mouth daily.      . [DISCONTINUED] omeprazole (PRILOSEC) 20 MG capsule TAKE 1 CAPSULE (20 MG TOTAL) BY MOUTH DAILY.  30 capsule  5   No facility-administered encounter medications on file as of 06/10/2012.          Objective:   Physical Exam  Constitutional: She is oriented to person, place, and time. She appears well-developed and well-nourished.  HENT:  Head: Normocephalic and atraumatic.  Right Ear: External ear normal.  Left Ear: External ear normal.  Nose: Nose normal.  Mouth/Throat: Oropharynx is clear and moist.  TMs and canals are clear.   Eyes: Conjunctivae and EOM are normal. Pupils are equal, round, and reactive to light.  Neck: Neck supple. No thyromegaly present.  Cardiovascular: Normal rate, regular rhythm and normal heart sounds.   Pulmonary/Chest:  Effort normal and breath sounds normal. She has no wheezes.  Lymphadenopathy:    She has no cervical adenopathy.  Neurological: She is alert and oriented to person, place, and time.  Skin: Skin is warm and dry.  Psychiatric: She has a normal mood and affect.          Assessment & Plan:  Palpitations - most likely a recurrence of her SVT. She is Re: cut her Topamax down to previous dose and I encouraged her to try cutting her omeprazole back down to 20 mg, I do not feel that this is the cause but certainly could be a contributing factor so I feel like if we drop her medicines back to her previous baseline we can see if her symptoms resolve. The meantime we will check for thyroid disorder, anemia, electrical disturbance, and general inflammation with sedimentation rate. We will call once these are available. If everything looks normal and her repeat event monitor is  consistent with SVT did recommend increasing her metoprolol for rate control. We have called the cardiologist's office to check a copy of the report. They did fax [place] her previous note from April 2013, from when she was having similar symptoms.  Bronchitis- likely viral but she c/o of chest tightness and pain on the left posterior lung this AM.  + Smoker. Because of chest tightness today I recommend chest x-ray for further evaluation but I suspect that this is most likely a viral illness. Hold off on antibiotics at this time she has no sinusitis type symptoms or fever.   GERD- sxs improving on prilosec 40mg .

## 2012-06-10 NOTE — Patient Instructions (Addendum)
We will call you with your lab results. If you don't here from [place] by Friday then please give [place] a call at [phone].

## 2012-06-11 LAB — COMPLETE METABOLIC PANEL WITH GFR
CO2: 26 mEq/L (ref 19–32)
Calcium: 9.8 mg/dL (ref 8.4–10.5)
Chloride: 108 mEq/L (ref 96–112)
GFR, Est African American: >89 mL/min
GFR, Est Non African American: >89 mL/min
Glucose, Bld: 86 mg/dL (ref 70–99)
Sodium: 139 mEq/L (ref 135–145)
Total Bilirubin: 0.6 mg/dL (ref 0.3–1.2)
Total Protein: 6.3 g/dL (ref 6.0–8.3)

## 2012-06-11 LAB — CBC WITH DIFFERENTIAL/PLATELET
Basophils Absolute: 0 10*3/uL (ref 0.0–0.1)
Basophils Relative: 0 % (ref 0–1)
Eosinophils Absolute: 0.2 10*3/uL (ref 0.0–0.7)
Eosinophils Relative: 2 % (ref 0–5)
Lymphs Abs: 2.1 10*3/uL (ref 0.7–4.0)
MCH: 32 pg (ref 26.0–34.0)
MCHC: 33.9 g/dL (ref 30.0–36.0)
Neutrophils Relative %: 71 % (ref 43–77)
Platelets: 299 10*3/uL (ref 150–400)
RBC: 4.16 MIL/uL (ref 3.87–5.11)
RDW: 13.7 % (ref 11.5–15.5)

## 2012-06-11 LAB — FERRITIN: Ferritin: 18 ng/mL (ref 10–291)

## 2012-06-12 ENCOUNTER — Telehealth: Payer: Self-pay | Admitting: Family Medicine

## 2012-06-12 NOTE — Telephone Encounter (Signed)
Please call patient and let her know that I finally got her interpretation of her Holter monitor. Evidently she was in sinus rhythm. She did have a maximum heart rate of 140 but had extremely rare PVCs, which are early beats. Her times of heart racing did not correspond to any of the rhythm abnormalities and thus they had recommended she followup with me to make sure there was nothing else going on and to check basic blood work. Her blood work is completely normal. Thus I don't have a great explanation for her symptoms except for a would like to see how she does on the lower dose of the Topamax and the omeprazole. We discussed in the office visit cutting these down to see if it made a difference because her symptoms started around the time that both of these medications were adjusted. Also consider that the palpitation sensation could be heartburn or reflux related so certainly see if she notices any correlation to her symptoms after she eats. She does not have a followup with me then let's have her schedule 1 in about 2 weeks to make sure that she's improving.

## 2012-06-15 NOTE — Telephone Encounter (Signed)
[facility] notified of results and MD instructions and will schedule a f/u with you as instructed. Barry Dienes, LPN

## 2012-06-22 ENCOUNTER — Ambulatory Visit (INDEPENDENT_AMBULATORY_CARE_PROVIDER_SITE_OTHER): Payer: BC Managed Care – PPO | Admitting: Family Medicine

## 2012-06-22 ENCOUNTER — Telehealth: Payer: Self-pay | Admitting: Family Medicine

## 2012-06-22 ENCOUNTER — Encounter: Payer: Self-pay | Admitting: Family Medicine

## 2012-06-22 VITALS — BP 106/64 | HR 84 | Wt 157.0 lb

## 2012-06-22 DIAGNOSIS — I471 Supraventricular tachycardia: Secondary | ICD-10-CM

## 2012-06-22 DIAGNOSIS — I498 Other specified cardiac arrhythmias: Secondary | ICD-10-CM

## 2012-06-22 DIAGNOSIS — D509 Iron deficiency anemia, unspecified: Secondary | ICD-10-CM

## 2012-06-22 MED ORDER — OMEPRAZOLE 20 MG PO CPDR: DELAYED_RELEASE_CAPSULE | ORAL | Status: DC

## 2012-06-22 MED ORDER — TOPIRAMATE 200 MG PO TABS: 100.0000 mg | ORAL_TABLET | Freq: Every day | ORAL | Status: DC

## 2012-06-22 MED ORDER — METOPROLOL SUCCINATE ER 25 MG PO TB24: 12.5000 mg | ORAL_TABLET | Freq: Every day | ORAL | Status: DC

## 2012-06-22 NOTE — Telephone Encounter (Signed)
Where should I call?

## 2012-06-22 NOTE — Progress Notes (Signed)
  Subjective:    Patient ID: Anne Mcdonald, female    DOB: Apr 16, 1960, 52 y.o.   MRN: [id number]  HPI She says her SVT is some better. She feels better after decreasing her prilosec. She had recently increased her Topamax and her Prilosec. She didn't having more heartburn symptoms, after she had run out of the Prilosec for a week. She had already cut back down on her Topamax when I saw her a week ago. We finally got the results on the Holter monitor which were normal. We did uncover low ferritin levels on her bloodwork.   She was told had low Iron in December and January at the [facility].  She has been eating spinach to get her iron up.   gReview of Systems     Objective:   Physical Exam  Constitutional: She is oriented to person, place, and time. She appears well-developed and well-nourished.  HENT:  Head: Normocephalic and atraumatic.  Cardiovascular: Normal rate, regular rhythm and normal heart sounds.   Pulmonary/Chest: Effort normal and breath sounds normal.  Neurological: She is alert and oriented to person, place, and time.  Skin: Skin is warm and dry.  Psychiatric: She has a normal mood and affect. Her behavior is normal.          Assessment & Plan:  SVT-symptoms are much improved. At this point in time I think is less likely related to the increase on the Prilosec. All her blood work was normal except for some low iron stores. She did remember remotely being told she had low iron back in December or January when she [facility]. 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 Her Holter monitor was unrevealing. She has felt much better after decreasing the dose back on her Prilosec. If she feels she suddenly getting worse or is not better in the next couple weeks then please let me know. In the meantime I did refill her metoprolol.  Low iron stores-she is doing well with trying to increase the iron in her diet  without taking a prescription medication. She does better with constipation from time to time and so if we can increase her levels without oral iron then that would be terrific. Recheck ferritin in 6-8 weeks. We will call the [facility] and check a copy of her labs from earlier this year.

## 2012-06-22 NOTE — Telephone Encounter (Addendum)
[facility] and get a copy of her last office visit. She thinks it was either in December or January. They did some labs that time as well and would like a copy of that. She says that she remembers being told that she was anemic at that time.

## 2012-06-22 NOTE — Patient Instructions (Addendum)
Recheck ferritin 6-8 weeks.  Call if not better in 1-2 weeks.

## 2012-06-23 NOTE — Telephone Encounter (Signed)
[facility] and they are faxing you the office note and labs. Barry Dienes, LPN

## 2012-08-24 ENCOUNTER — Other Ambulatory Visit: Payer: Self-pay | Admitting: Family Medicine

## 2012-11-02 ENCOUNTER — Encounter: Payer: Self-pay | Admitting: Family Medicine

## 2012-11-02 ENCOUNTER — Ambulatory Visit (INDEPENDENT_AMBULATORY_CARE_PROVIDER_SITE_OTHER): Payer: BC Managed Care – PPO | Admitting: Family Medicine

## 2012-11-02 VITALS — BP 101/64 | HR 63 | Ht 64.0 in | Wt 151.0 lb

## 2012-11-02 DIAGNOSIS — H9190 Unspecified hearing loss, unspecified ear: Secondary | ICD-10-CM

## 2012-11-02 DIAGNOSIS — H9193 Unspecified hearing loss, bilateral: Secondary | ICD-10-CM
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
## 2012-11-02 NOTE — Patient Instructions (Signed)
We will call with referral information

## 2013-01-04 ENCOUNTER — Other Ambulatory Visit: Payer: Self-pay | Admitting: Physician Assistant

## 2013-01-08 ENCOUNTER — Other Ambulatory Visit: Payer: Self-pay | Admitting: Physician Assistant

## 2013-03-04 ENCOUNTER — Ambulatory Visit (INDEPENDENT_AMBULATORY_CARE_PROVIDER_SITE_OTHER): Payer: BC Managed Care – PPO | Admitting: Family Medicine

## 2013-03-04 ENCOUNTER — Encounter: Payer: Self-pay | Admitting: Family Medicine

## 2013-03-04 VITALS — BP 105/62 | HR 75 | Temp 97.5°F | Ht 64.0 in | Wt 149.0 lb

## 2013-03-04 DIAGNOSIS — F329 Major depressive disorder, single episode, unspecified: Secondary | ICD-10-CM

## 2013-03-04 DIAGNOSIS — Z23 Encounter for immunization: Secondary | ICD-10-CM

## 2013-03-04 DIAGNOSIS — F32A Depression, unspecified: Secondary | ICD-10-CM

## 2013-03-04 MED ORDER — FLUOXETINE HCL 40 MG PO CAPS: 80.0000 mg | ORAL_CAPSULE | Freq: Every day | ORAL | Status: DC

## 2013-03-04 NOTE — Progress Notes (Addendum)
   Subjective:    Patient ID: Anne Mcdonald, female    DOB: 1960-08-04, 52 y.o.   MRN: [id number]  HPI Feels down and tearful for the last 1-2 months.  Wants to sit and cry.  She is sleeping too much.  No thoughts of harming herself.  Feels unmotivated. Has never really felt like this before.  No know triggers. She complains of feeling down nearly every day as well as feeling very fatigued and tired. She complains of overeating and feeling down about about herself daily every day. She currently takes fluoxetine primarily for control of premenstrual dysphoric disorder. As prescribed by her OB/GYN. She is not menopausal yet. She still has very regular periods. She requests hormone testing to see if she may be getting near menopause.    Review of Systems     Objective:   Physical Exam  Constitutional: She is oriented to person, place, and time. She appears well-developed and well-nourished.  HENT:  Head: Normocephalic and atraumatic.  Cardiovascular: Normal rate, regular rhythm and normal heart sounds.   Pulmonary/Chest: Effort normal and breath sounds normal.  Neurological: She is alert and oriented to person, place, and time.  Skin: Skin is warm and dry.  Psychiatric: She has a normal mood and affect. Her behavior is normal.          Assessment & Plan:  Depression - PHQ- 9 score 17.  Uncontrolled. No known triggers for this episode. This will be her first episode in her lifetime. She is interested in counseling and says she plans on starting that in January. She her husband regarding looked into finding someone. In the meantime I offered her 2 different options. One we can surly increase the fluoxetine to 80 mg to see if this better controls her symptoms. [name] currently taking 40 mg. Or we could change her to a different medication. She says she likes to try the higher dose of fluoxetine first. I will see her back in 3-4 weeks. At that time if she's not well controlled then we will  switch her medication. [name] never tried anything else. It is possible that some of her symptoms are related to perimenopause. She requests hormone testing as well. We'll certainly add these to the labs.

## 2013-03-05 LAB — TSH: TSH: 1.574 u[IU]/mL (ref 0.350–4.500)

## 2013-03-05 LAB — LUTEINIZING HORMONE: LH: 3.9 m[IU]/mL

## 2013-03-05 LAB — FOLLICLE STIMULATING HORMONE: FSH: 3.6 m[IU]/mL

## 2013-03-08 ENCOUNTER — Telehealth: Payer: Self-pay

## 2013-03-08 NOTE — Telephone Encounter (Signed)
Patient called and left a message on nurse line asking for a return call.   Returned Call: Left message asking patient to call back.  

## 2013-05-24 ENCOUNTER — Other Ambulatory Visit: Payer: Self-pay | Admitting: Family Medicine

## 2013-07-05 ENCOUNTER — Other Ambulatory Visit: Payer: Self-pay | Admitting: Family Medicine

## 2013-07-06 ENCOUNTER — Other Ambulatory Visit: Payer: Self-pay | Admitting: Family Medicine

## 2013-07-22 ENCOUNTER — Ambulatory Visit (INDEPENDENT_AMBULATORY_CARE_PROVIDER_SITE_OTHER): Payer: BC Managed Care – PPO | Admitting: Family Medicine

## 2013-07-22 ENCOUNTER — Telehealth: Payer: Self-pay | Admitting: Family Medicine

## 2013-07-22 ENCOUNTER — Encounter: Payer: Self-pay | Admitting: Family Medicine

## 2013-07-22 VITALS — BP 104/69 | HR 66 | Wt 150.0 lb

## 2013-07-22 DIAGNOSIS — G43829 Menstrual migraine, not intractable, without status migrainosus: Secondary | ICD-10-CM

## 2013-07-22 DIAGNOSIS — N943 Premenstrual tension syndrome: Secondary | ICD-10-CM

## 2013-07-22 DIAGNOSIS — E781 Pure hyperglyceridemia: Secondary | ICD-10-CM

## 2013-07-22 DIAGNOSIS — F17200 Nicotine dependence, unspecified, uncomplicated: Secondary | ICD-10-CM

## 2013-07-22 DIAGNOSIS — R5381 Other malaise: Secondary | ICD-10-CM

## 2013-07-22 DIAGNOSIS — R5383 Other fatigue: Secondary | ICD-10-CM

## 2013-07-22 MED ORDER — VENLAFAXINE HCL ER 37.5 MG PO CP24: ORAL_CAPSULE | ORAL | Status: DC

## 2013-07-22 MED ORDER — FLUOXETINE HCL 10 MG PO CAPS: ORAL_CAPSULE | ORAL | Status: DC

## 2013-07-22 MED ORDER — OMEPRAZOLE 20 MG PO CPDR: DELAYED_RELEASE_CAPSULE | ORAL | Status: DC

## 2013-07-22 MED ORDER — FUSION PLUS PO CAPS: 1.0000 | ORAL_CAPSULE | Freq: Every day | ORAL | Status: DC

## 2013-07-22 MED ORDER — METOPROLOL SUCCINATE ER 25 MG PO TB24: ORAL_TABLET | ORAL | Status: DC

## 2013-07-22 NOTE — Progress Notes (Signed)
   Subjective:    Patient ID: Anne Mcdonald, female    DOB: September 06, 1960, 53 y.o.   MRN: [id number]  HPI Depression increase her fluoxetine to 80 mg, 4 months ago, in December. She was dealing with increased symptoms of depression at that time. She plans on starting to see a counselor. Towards the end of her period and at the end feels more emotional. Says usually gets migraine with her menses and usually lasts a few days. This last cycle her migraines last for 2 weeks even after her period ended. This was very unusual for her and had to call her headache clinic.  Iron deficiency-she was unable to tolerate over-the-counter iron. She says it really messed her stomach. It was painful. She wants to know what the other options are.  Tobacco abuse-she know she needs to quit but is not quite ready. She says she has cut back to about half a pack a day from one pack a day. Review of Systems     Objective:   Physical Exam  Constitutional: She is oriented to person, place, and time. She appears well-developed and well-nourished.  HENT:  Head: Normocephalic and atraumatic.  Eyes: Conjunctivae and EOM are normal.  Cardiovascular: Normal rate.   Pulmonary/Chest: Effort normal.  Neurological: She is alert and oriented to person, place, and time.  Skin: Skin is dry. No pallor.  Psychiatric: She has a normal mood and affect. Her behavior is normal.          Assessment & Plan:Depression-PHQ 9 score of depression  Depression- PHQ- 9 score of 5 today. Overall she is well-controlled except for the time around her period. We could discontinue the fluoxetine and consider switching her to something like Effexor which can help with perimenopausal type symptoms. She still has regular cycles if the age of 63.  Migraine headaches-she has had a little bit more difficult time. She follows with neurology for this. Please let me know if it happens again were her next period triggers extended migraines.  Iron  def anemia -  Will try Fusion Plus which is a probiotic and can be more gentle on the stomach. She has problems with this and please let me know. Also consider taking a prenatal vitamin which has extra iron and hematocrit weight increase overall dietary intake.  Tob abuse - Encourage cessation smoking 1/2 ppd.

## 2013-07-22 NOTE — Telephone Encounter (Signed)
Please call patient: I think we should try switching the medication to Effexor. Decrease fluoxetine back down to 40 mg. I will send her a prescription for 10 mg. Just follow the instructions on the bottle after she has been on the 40 mg for one week. Once you're completely off the fluoxetine he did start the prescription for the venlafaxine which is Effexor.

## 2013-07-22 NOTE — Telephone Encounter (Signed)
Pt informed.Anne Mcdonald  

## 2013-07-28 ENCOUNTER — Telehealth: Payer: Self-pay | Admitting: *Deleted

## 2013-07-28 NOTE — Telephone Encounter (Signed)
spke to pt's husband and gave him the directions on how she should take meds. He states that she has been down and made a comment about taking her life he reports that she did go to work today. I advised him strongly that he should take her directly to the ED should she continue to make these kind of comments so that she can be observed and evaluated. He asked if I could her in the morning to check on her. Will forward to pcp.Teddy Spike

## 2013-07-29 NOTE — Telephone Encounter (Signed)
Called pt to check on her lvm for return call.Teddy Spike

## 2013-07-30 ENCOUNTER — Telehealth: Payer: Self-pay | Admitting: *Deleted

## 2013-07-30 NOTE — Telephone Encounter (Signed)
Spoke with patient and she report that she is concerned about her PMDD sxs. She had a really bad day on Monday she states that she was to start her period on Tuesday and was really depressed. I asked how she was feeling today and she stated that she was better today. She said that she was uncertain about her mood due to her sxs. She also had concerns about weaning off of prozac and starting new med. I informed her that at no point will medication be out of her system and she should transition well however, if she is not feeling stable to please call and inform us.Teddy Spike

## 2013-08-30 ENCOUNTER — Encounter: Payer: Self-pay | Admitting: Family Medicine

## 2013-08-30 ENCOUNTER — Ambulatory Visit (INDEPENDENT_AMBULATORY_CARE_PROVIDER_SITE_OTHER): Payer: BC Managed Care – PPO | Admitting: Family Medicine

## 2013-08-30 VITALS — BP 104/69 | HR 101 | Wt 151.0 lb

## 2013-08-30 DIAGNOSIS — R42 Dizziness and giddiness: Secondary | ICD-10-CM

## 2013-08-30 DIAGNOSIS — F3281 Premenstrual dysphoric disorder: Secondary | ICD-10-CM

## 2013-08-30 DIAGNOSIS — N943 Premenstrual tension syndrome: Secondary | ICD-10-CM

## 2013-08-30 LAB — POCT HEMOGLOBIN: Hemoglobin: 13.4 g/dL (ref 12.2–16.2)

## 2013-08-30 NOTE — Progress Notes (Signed)
   Subjective:    Patient ID: Anne Mcdonald, female    DOB: June 23, 1960, 53 y.o.   MRN: [id number]  HPI  Started feeling lightheaded on Saturday.  She was working outside in the heat at [employer].  She says she drinks lots of water to stay hydrated. Says still feels lightheaded, like she was going to pass out..  Started it 2 days ago. Went up on the dose a week ago. No room spinning but feels like might pass out.  Had some heart racing this AM. Off her iron for a week.  Did start her period. No ear pain or pressure.  No recent cold sxs. No fever.  No room spinning. No chest pain. No abdominal pain. No fevers chills or sweats.  Review of Systems     Objective:   Physical Exam  Constitutional: She is oriented to person, place, and time. She appears well-developed and well-nourished.  HENT:  Head: Normocephalic and atraumatic.  Right Ear: External ear normal.  Left Ear: External ear normal.  Nose: Nose normal.  Mouth/Throat: Oropharynx is clear and moist.  TMs and canals are clear.   Eyes: Conjunctivae and EOM are normal. Pupils are equal, round, and reactive to light.  Neck: Neck supple. No thyromegaly present.  Cardiovascular: Normal rate, regular rhythm and normal heart sounds.   Pulmonary/Chest: Effort normal and breath sounds normal. She has no wheezes.  Musculoskeletal: She exhibits no edema.  Lymphadenopathy:    She has no cervical adenopathy.  Neurological: She is alert and oriented to person, place, and time.  Skin: Skin is warm and dry.  Psychiatric: She has a normal mood and affect.          Assessment & Plan:  Dizziness-negative Dix-Hallpike maneuver but the sitting up did cause her to be dizzy. Will check orthostatics today. The sites were normal today. We did do a fingerstick hemoglobin just to make sure that she wasn't severely anemic and it was normal. Check she feels a little bit better today than she did on Saturday. Recommend increased hydration. Get back on iron  supplement. We'll also decrease Effexor back down to one tablet she did well with for least a week. Continue for now. Followup in one month. She still not doing well then I would like to see her on Thursday. She will go for her full set of blood work in the next 2-3 days. She is not a candidate for oral contraceptives because she is a smoker.  PMDD-will continue his low-dose Effexor for now. May also help with some of her perimenopausal symptoms. [name] not a candidate for birth control because she is a smoker. If she does not have improvement of symptoms on the Effexor consider citalopram since she has also not had significant relief with fluoxetine.

## 2013-08-30 NOTE — Patient Instructions (Signed)
Decrease Effexor down to 1 tab daily.  Continue for 1-2 week and then can try to increase dose again if needed or if doing well on it then can stay with one tab.

## 2013-08-31 LAB — CBC WITH DIFFERENTIAL/PLATELET
BASOS ABS: 0 10*3/uL (ref 0.0–0.1)
Basophils Relative: 0 % (ref 0–1)
EOS PCT: 2 % (ref 0–5)
Eosinophils Absolute: 0.2 10*3/uL (ref 0.0–0.7)
HEMATOCRIT: 37.9 % (ref 36.0–46.0)
Hemoglobin: 12.8 g/dL (ref 12.0–15.0)
LYMPHS ABS: 2.2 10*3/uL (ref 0.7–4.0)
LYMPHS PCT: 28 % (ref 12–46)
MCH: 32.3 pg (ref 26.0–34.0)
MCHC: 33.8 g/dL (ref 30.0–36.0)
MCV: 95.7 fL (ref 78.0–100.0)
MONO ABS: 0.5 10*3/uL (ref 0.1–1.0)
Monocytes Relative: 7 % (ref 3–12)
Neutro Abs: 4.9 10*3/uL (ref 1.7–7.7)
Neutrophils Relative %: 63 % (ref 43–77)
Platelets: 246 10*3/uL (ref 150–400)
RBC: 3.96 MIL/uL (ref 3.87–5.11)
RDW: 13.6 % (ref 11.5–15.5)
WBC: 7.8 10*3/uL (ref 4.0–10.5)

## 2013-09-01 LAB — COMPLETE METABOLIC PANEL WITH GFR
ALBUMIN: 4.1 g/dL (ref 3.5–5.2)
ALK PHOS: 58 U/L (ref 39–117)
ALT: 26 U/L (ref 0–35)
AST: 28 U/L (ref 0–37)
BUN: 15 mg/dL (ref 6–23)
CO2: 21 mEq/L (ref 19–32)
Calcium: 9.2 mg/dL (ref 8.4–10.5)
Chloride: 108 mEq/L (ref 96–112)
Creat: 0.77 mg/dL (ref 0.50–1.10)
GFR, Est African American: >89 mL/min
GFR, Est Non African American: 88 mL/min
Glucose, Bld: 88 mg/dL (ref 70–99)
POTASSIUM: 3.7 meq/L (ref 3.5–5.3)
SODIUM: 138 meq/L (ref 135–145)
Total Bilirubin: 0.5 mg/dL (ref 0.2–1.2)
Total Protein: 6.2 g/dL (ref 6.0–8.3)

## 2013-09-01 LAB — LIPID PANEL
CHOL/HDL RATIO: 3.7 ratio
CHOLESTEROL: 187 mg/dL (ref 0–200)
HDL: 51 mg/dL (ref >39)
LDL CALC: 93 mg/dL (ref 0–99)
Triglycerides: 215 mg/dL — ABNORMAL HIGH (ref <150)
VLDL: 43 mg/dL — AB (ref 0–40)

## 2013-09-02 ENCOUNTER — Ambulatory Visit: Payer: BC Managed Care – PPO | Admitting: Family Medicine

## 2013-10-04 ENCOUNTER — Other Ambulatory Visit: Payer: Self-pay | Admitting: *Deleted

## 2013-10-04 MED ORDER — VENLAFAXINE HCL ER 37.5 MG PO CP24: ORAL_CAPSULE | ORAL | Status: DC

## 2013-10-06 ENCOUNTER — Other Ambulatory Visit: Payer: Self-pay | Admitting: Family Medicine

## 2014-02-07 ENCOUNTER — Encounter: Payer: Self-pay | Admitting: Family Medicine

## 2014-02-07 ENCOUNTER — Ambulatory Visit (INDEPENDENT_AMBULATORY_CARE_PROVIDER_SITE_OTHER): Payer: BC Managed Care – PPO | Admitting: Family Medicine

## 2014-02-07 VITALS — BP 114/76 | HR 79 | Wt 154.0 lb

## 2014-02-07 DIAGNOSIS — N943 Premenstrual tension syndrome: Secondary | ICD-10-CM

## 2014-02-07 DIAGNOSIS — G43831 Menstrual migraine, intractable, with status migrainosus: Secondary | ICD-10-CM

## 2014-02-07 DIAGNOSIS — K5901 Slow transit constipation: Secondary | ICD-10-CM

## 2014-02-07 MED ORDER — PREDNISONE 10 MG PO TABS: ORAL_TABLET | ORAL | Status: DC

## 2014-02-07 MED ORDER — VENLAFAXINE HCL ER 37.5 MG PO CP24: ORAL_CAPSULE | ORAL | Status: DC

## 2014-02-07 NOTE — Progress Notes (Signed)
   Subjective:    Patient ID: Anne Mcdonald, female    DOB: 1960/07/14, 53 y.o.   MRN: [id number]  HPI Hasn't felt well for about 2 weeks. Has been having more frequent HA.  Usually just gets them around her period, but started the week before this time. Also had hit her head at work.  Went to the ED yesterday AM  and they did a head CT that was noraml. Says usually her migraine meds work well but not this time.  Has been really nauseated and ringin in her ears.    She gets more constipated right before her period. But this time no BM for 3 days and this was unusual.  She finally had a bowel movement but she has not gone again in the last 2 days. She denies abdominal pain. No blood in the stool. No fevers chills or sweats. She says she tends to have slow bowels anyway. She is taking Senokot.   Review of Systems     Objective:   Physical Exam  Constitutional: She is oriented to person, place, and time. She appears well-developed and well-nourished.  HENT:  Head: Normocephalic and atraumatic.  Right Ear: External ear normal.  Left Ear: External ear normal.  Nose: Nose normal.  Mouth/Throat: Oropharynx is clear and moist.  TMs and canals are clear.   Eyes: Conjunctivae and EOM are normal. Pupils are equal, round, and reactive to light.  Neck: Neck supple. No thyromegaly present.  Cardiovascular: Normal rate, regular rhythm and normal heart sounds.   Pulmonary/Chest: Effort normal and breath sounds normal. She has no wheezes.  Lymphadenopathy:    She has no cervical adenopathy.  Neurological: She is alert and oriented to person, place, and time.  Skin: Skin is warm and dry.  Psychiatric: She has a normal mood and affect.          Assessment & Plan:  Status migrainous-patient has had a consistent headache for over a week at this point in time. We'll go ahead and treat with a prednisone taper. Stop all over-the-counter medications for headache.  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 She  finished her period today so this should help as well.  Constipation-recommend a trial of MiraLAX. Encouraged her to use twice a day until she actually has a soft bowel movement. Make sure eating plenty of fiber and drinking plenty of fluids.

## 2014-02-07 NOTE — Patient Instructions (Addendum)
Miralax twice a day until have a soft bowel movement.   Can start the steroid taper.  Cal if not better in one week.   Increase the Effexor to 2 tabs daily.

## 2014-02-09 ENCOUNTER — Telehealth: Payer: Self-pay

## 2014-02-09 NOTE — Telephone Encounter (Signed)
Anne Mcdonald reports she still is constipated even after taking 2 doses of Miralax on Monday and 1 on Tuesday. She states she has increased her fluid intake. Denies abdominal pain, fever, chills, nausea or vomiting. Please advise.

## 2014-02-09 NOTE — Telephone Encounter (Signed)
Ok to use magnesium citrate.  It is over the counter.  Still continue with the miralax as well until soft BM.

## 2014-02-10 NOTE — Telephone Encounter (Signed)
Anne Mcdonald is going to try the magnesium citrate and if she is unable to have a BM today she wants to be seen.

## 2014-03-01 ENCOUNTER — Encounter: Payer: Self-pay | Admitting: Family Medicine

## 2014-03-01 ENCOUNTER — Ambulatory Visit (INDEPENDENT_AMBULATORY_CARE_PROVIDER_SITE_OTHER): Payer: BC Managed Care – PPO

## 2014-03-01 ENCOUNTER — Ambulatory Visit (INDEPENDENT_AMBULATORY_CARE_PROVIDER_SITE_OTHER): Payer: BC Managed Care – PPO | Admitting: Family Medicine

## 2014-03-01 VITALS — BP 103/66 | HR 79 | Temp 98.1°F | Wt 152.0 lb

## 2014-03-01 DIAGNOSIS — F17200 Nicotine dependence, unspecified, uncomplicated: Secondary | ICD-10-CM

## 2014-03-01 DIAGNOSIS — Z720 Tobacco use: Secondary | ICD-10-CM

## 2014-03-01 DIAGNOSIS — M546 Pain in thoracic spine: Secondary | ICD-10-CM | POA: Diagnosis not present

## 2014-03-01 DIAGNOSIS — Z1211 Encounter for screening for malignant neoplasm of colon: Secondary | ICD-10-CM

## 2014-03-01 DIAGNOSIS — R079 Chest pain, unspecified: Secondary | ICD-10-CM

## 2014-03-01 DIAGNOSIS — K5901 Slow transit constipation: Secondary | ICD-10-CM

## 2014-03-01 NOTE — Progress Notes (Signed)
   Subjective:    Patient ID: Anne Mcdonald, female    DOB: 1960/07/11, 53 y.o.   MRN: [id number]  HPI Constipation - Says she tried the Miralax BID for a week and no results.  Started using aloe vera juice a few days ago and now finally had a BM today. Having a lot of gas.  Says getting some relief. Says seems to alternate between constipation and diarrhea. She doesn't often have regular bowel movements. She does get some occasional epigastric discomfort. She does take medication for history of GI ulcers. She also continues to smoke daily.  Upper back pain for a few weeks.  Has been aching.  Taking IBU for it- helps some . No injury or trauam.  Say had some left axillary. No cough or SOB.  No pain after eating. Sometimes feels worse when she bends over, and gets a little bit of relief when she extends her back.Marland Kitchen + smoker.     Review of Systems     Objective:   Physical Exam  Constitutional: She is oriented to person, place, and time. She appears well-developed and well-nourished.  HENT:  Head: Normocephalic and atraumatic.  Cardiovascular: Normal rate, regular rhythm and normal heart sounds.   Pulmonary/Chest: Effort normal and breath sounds normal.  Abdominal: Soft. Bowel sounds are normal. There is tenderness.  Mildy tender over the epigastric area.   Musculoskeletal:  Nontender over the thoracic spine or paraspinous muscles. Some discomfort with full flexion but none with extension. Otherwise normal flexion and extension of the spine itself. Neck and shoulders with normal range of motion.  Neurological: She is alert and oriented to person, place, and time.  Skin: Skin is warm and dry.  Psychiatric: She has a normal mood and affect. Her behavior is normal.          Assessment & Plan:  Constipation - She is doing better w/ the aloe vera juice.  She wants to stick with that a little longer. If no helping then can try Amitiza or Linzess. She will call me in a week or 2 if she  decides she wants to try one of these medications.  Strongly recommend get a colonoscopy since she is now 67 and has not had any colon cancer screening. She says she's been delaying it because they still are pain on her husband's colonoscopy. But stressed the importance of getting the sun especially with her recent changes in bowel habits.  Thoracic spine pain-amenable to cause her pain by palpating the spine. So we'll go ahead and order an x-ray today's session was no trauma or injury. [name] also a smoker and worried about possibility of lung cancer so was requesting a chest x-ray as well especially since she had some left side pain. Though she denies any chest pain or shortness of breath.  Tobacco abuse-encourage cessation.

## 2014-03-05 ENCOUNTER — Other Ambulatory Visit: Payer: Self-pay | Admitting: Family Medicine

## 2014-03-15 ENCOUNTER — Other Ambulatory Visit: Payer: Self-pay | Admitting: Family Medicine

## 2014-04-20 ENCOUNTER — Encounter: Payer: Self-pay | Admitting: Family Medicine

## 2014-04-20 ENCOUNTER — Ambulatory Visit (INDEPENDENT_AMBULATORY_CARE_PROVIDER_SITE_OTHER): Payer: BLUE CROSS/BLUE SHIELD | Admitting: Family Medicine

## 2014-04-20 VITALS — BP 108/69 | HR 84 | Wt 160.0 lb

## 2014-04-20 DIAGNOSIS — K5900 Constipation, unspecified: Secondary | ICD-10-CM | POA: Diagnosis not present

## 2014-04-20 DIAGNOSIS — J309 Allergic rhinitis, unspecified: Secondary | ICD-10-CM

## 2014-04-20 DIAGNOSIS — G43009 Migraine without aura, not intractable, without status migrainosus: Secondary | ICD-10-CM

## 2014-04-20 DIAGNOSIS — H9193 Unspecified hearing loss, bilateral: Secondary | ICD-10-CM

## 2014-04-20 DIAGNOSIS — K5909 Other constipation: Secondary | ICD-10-CM

## 2014-04-20 MED ORDER — LUBIPROSTONE 24 MCG PO CAPS: 24.0000 ug | ORAL_CAPSULE | Freq: Two times a day (BID) | ORAL | Status: DC

## 2014-04-20 MED ORDER — METOPROLOL SUCCINATE ER 25 MG PO TB24: ORAL_TABLET | ORAL | Status: DC

## 2014-04-20 MED ORDER — VENLAFAXINE HCL ER 37.5 MG PO CP24: ORAL_CAPSULE | ORAL | Status: DC

## 2014-04-20 MED ORDER — FLUTICASONE PROPIONATE 50 MCG/ACT NA SUSP: 2.0000 | Freq: Every day | NASAL | Status: DC

## 2014-04-20 NOTE — Patient Instructions (Signed)
Recommend a medication such as over-the-counter Claritin to take once a day for the sneezing, runny nose, and ear pain and pressure. The tests that we did was normal. If he feels like the ear pain or pressure is getting worse or he develop a fever or notices any drainage from the ear then please come in as soon as possible.

## 2014-04-20 NOTE — Progress Notes (Signed)
   Subjective:    Patient ID: Anne Mcdonald, female    DOB: 08/24/1960, 54 y.o.   MRN: [id number]  HPI Follow-up constipation. She was last seen about 6 weeks ago. She is Re: Tried MiraLAX and fiber supplementation. She just started using aloe vera juice and was getting some good results and wanted to stick with that at that time. There we had discussed some prescription options as well. She was contacted by GI for colonoscopy. She says didb't schedule at that time. She is ready to schedule now so needs phone #.    Follow-up migraine headaches.  Using Excedrin Migraine daily.  Feels like her bowels are what is trigger her migraines. Has appt with her Neurologist today.    Hearing loss -she was evaluated by the audiologist here in [place] and was told that she was a candidate for hearing aids. She really needs hearing aids but says she can't afford it right now.  Does need a note for jury duty.   She also complains of runny nose and sneezing 6 weeks. [name] also had some significant pain and pressure in her left ear. No drainage or fevers or chills. She think she may have developed allergies even the she's never had palms with this during her lifetime. She has tried a 24 hour allergy relief with no improvement.   Review of Systems     Objective:   Physical Exam  Constitutional: She is oriented to person, place, and time. She appears well-developed and well-nourished.  HENT:  Head: Normocephalic and atraumatic.  Right Ear: External ear normal.  Left Ear: External ear normal.  Nose: Nose normal.  Mouth/Throat: Oropharynx is clear and moist.  TMs and canals are clear.   Eyes: Conjunctivae and EOM are normal. Pupils are equal, round, and reactive to light.  Neck: Neck supple. No thyromegaly present.  Cardiovascular: Normal rate, regular rhythm and normal heart sounds.   Pulmonary/Chest: Effort normal and breath sounds normal. She has no wheezes.  Abdominal: Soft. Bowel sounds are  normal. She exhibits no distension and no mass. There is no tenderness. There is no rebound and no guarding.  Lymphadenopathy:    She has no cervical adenopathy.  Neurological: She is alert and oriented to person, place, and time.  Skin: Skin is warm and dry.  Psychiatric: She has a normal mood and affect.          Assessment & Plan:  Constipation-recommend a trial of amitiza.  Encouraged her to stick with it for several weeks to see if it house with irregularity of stool. Make sure taking plenty of water and take it with food. Given the phone number for digestive health to call and schedule her colonoscopy.  Migraine headaches-has follow-up with her neurologist later today. Warned her about taking things like her Excedrin Migraine on a daily basis as this can cause rebound headaches. Next  Hearing loss-given note for jury duty.  Allergic rhinitis with left otalgia-because exam was normal we did perform tympanometry.  Trial of nasal steroid spray. Rx sent.

## 2014-05-04 ENCOUNTER — Other Ambulatory Visit: Payer: Self-pay

## 2014-05-04 ENCOUNTER — Telehealth: Payer: Self-pay

## 2014-05-04 NOTE — Telephone Encounter (Signed)
Sent [place] through cover my meds. Waiting on response - CF

## 2014-05-04 NOTE — Telephone Encounter (Signed)
Spoke w/Ashley gave ok for 90 day with 2 refills.Audelia Hives [place]

## 2014-05-05 ENCOUNTER — Telehealth: Payer: Self-pay

## 2014-05-05 ENCOUNTER — Other Ambulatory Visit: Payer: Self-pay | Admitting: *Deleted

## 2014-05-05 MED ORDER — OMEPRAZOLE 20 MG PO CPDR: 20.0000 mg | DELAYED_RELEASE_CAPSULE | Freq: Every day | ORAL | Status: DC

## 2014-05-05 NOTE — Telephone Encounter (Signed)
Received another fax from [facility] with additional questions I filled it out and placed it in Dr. Gardiner Ramus box for a signature. I will fax as soon as it is signed - CF

## 2014-05-16 ENCOUNTER — Telehealth: Payer: Self-pay

## 2014-05-16 ENCOUNTER — Telehealth: Payer: Self-pay | Admitting: Family Medicine

## 2014-05-16 NOTE — Telephone Encounter (Signed)
Received fax for [place]. The request was denied due to not trying and failing 3 alternatives - CF

## 2014-05-16 NOTE — Telephone Encounter (Signed)
Please call patient and let her know that she will have to try Linzess instead of the Amitiza per her insurance. If she's okay with ascending over the new prescription please let me know.

## 2014-05-16 NOTE — Telephone Encounter (Signed)
Left VM for patient to call back regarding this prescription. - CF

## 2014-05-30 ENCOUNTER — Encounter: Payer: Self-pay | Admitting: Family Medicine

## 2014-05-30 ENCOUNTER — Ambulatory Visit (INDEPENDENT_AMBULATORY_CARE_PROVIDER_SITE_OTHER): Payer: BLUE CROSS/BLUE SHIELD | Admitting: Family Medicine

## 2014-05-30 VITALS — BP 128/83 | HR 75 | Wt 161.0 lb

## 2014-05-30 DIAGNOSIS — R1013 Epigastric pain: Secondary | ICD-10-CM | POA: Diagnosis not present

## 2014-05-30 DIAGNOSIS — J302 Other seasonal allergic rhinitis: Secondary | ICD-10-CM

## 2014-05-30 MED ORDER — SUCRALFATE 1 GM/10ML PO SUSP: 1.0000 g | Freq: Three times a day (TID) | ORAL | Status: DC

## 2014-05-30 MED ORDER — OMEPRAZOLE 40 MG PO CPDR: 40.0000 mg | DELAYED_RELEASE_CAPSULE | Freq: Every day | ORAL | Status: DC

## 2014-05-30 NOTE — Progress Notes (Signed)
Subjective:    Patient ID: Anne Mcdonald, female    DOB: 06/29/1960, 54 y.o.   MRN: [id number]  HPI 3 days ago says couldn't even eat dinner bc stomach was hurting.  Did have a BM and felt a little better but stomach got worse that night. No vomiting or diarrhea.  Pain is mostly in the epigastrum.  Says eating doesn't make it worse but says just doesn't want to eat.   She is taking her omeprazole 20mg  daily.    Constantly sneezing, congestion, runny. Nose spray is not helping.  Tried it for 2 weeks but stopped it bc was irritating the lining of her nose. Allergy meds only help temporarily.  No fever. No facial pain or pressure.  Started this winter when started running the heat at home.    BP 128/83 mmHg  Pulse 75  Wt 161 lb (73.029 kg)  SpO2 100%    No Known Allergies  Past Medical History  Diagnosis Date  . PUD (peptic ulcer disease)     Past Surgical History  Procedure Laterality Date  . Cholecystectomy    . Cesarean section      History   Social History  . Marital Status: Married    Spouse Name: N/A  . Number of Children: N/A  . Years of Education: N/A   Occupational History  . Not on file.   Social History Main Topics  . Smoking status: Current Every Day Smoker -- 1.50 packs/day    Types: Cigarettes  . Smokeless tobacco: Not on file  . Alcohol Use: Yes  . Drug Use: No  . Sexual Activity: Not on file   Other Topics Concern  . Not on file   Social History Narrative    No family history on file.  Outpatient Encounter Prescriptions as of 05/30/2014  Medication Sig  . fluticasone (FLONASE) 50 MCG/ACT nasal spray Place 2 sprays into both nostrils daily.  . Iron-FA-B Cmp-C-Biot-Probiotic (FUSION PLUS) CAPS TAKE 1 CAPSULE BY MOUTH DAILY.  Marland Kitchen lubiprostone (AMITIZA) 24 MCG capsule Take 1 capsule (24 mcg total) by mouth 2 (two) times daily with a meal.  . metoprolol succinate (TOPROL-XL) 25 MG 24 hr tablet TAKE 1/2 TABLET BY MOUTH EVERY DAY  . omeprazole  (PRILOSEC) 40 MG capsule Take 1 capsule (40 mg total) by mouth daily.  . rizatriptan (MAXALT) 10 MG tablet   . topiramate (TOPAMAX) 200 MG tablet Take 0.5 tablets (100 mg total) by mouth daily.  Marland Kitchen venlafaxine XR (EFFEXOR-XR) 37.5 MG 24 hr capsule 2 CAPSULES EVERY DAY  . [DISCONTINUED] omeprazole (PRILOSEC) 20 MG capsule Take 1 capsule (20 mg total) by mouth daily.  . sucralfate (CARAFATE) 1 GM/10ML suspension Take 10 mLs (1 g total) by mouth 4 (four) times daily -  with meals and at bedtime.        Review of Systems     Objective:   Physical Exam  Constitutional: She is oriented to person, place, and time. She appears well-developed and well-nourished.  HENT:  Head: Normocephalic and atraumatic.  Abdominal: Soft. Bowel sounds are normal. She exhibits no distension and no mass. There is tenderness. There is no rebound and no guarding.  Tender to palpation in the epigastrium and in the left lateral abdomen.  Neurological: She is alert and oriented to person, place, and time.  Skin: Skin is warm and dry.  Psychiatric: She has a normal mood and affect.          Assessment & Plan:  Epigastric pain-she does have a prior history of gastric ulcer. She decided colonoscopy last week. Will increase her omeprazole 40 mg and add Carafate for better control. I like to see her back in 6 weeks. [name] doing well at that time then we will wean the omeprazole so that we can test her for H. pylori with a breath test. But she will have to be off of her PPI for 14 days. She can discontinue the Carafate after 2-3 weeks if she's feeling much better. Reviewed GERD diet as well. Please call back if feels worse, gets nauseated, starts to vomit, runs a fever, or change in bowel movements.  Allergic rhinitis-she's not gotten significant relief with over-the-counter products recommend referral to allergist for further evaluation. She did get some benefit with a nasal steroid spray but was only able to use it for  short period of time because of irritation the nose. We could certainly consider switching products but she may need full allergy testing. She wants to wait until they are no longer running the heat in her home and see if that makes a difference. She noticed that that's what her symptoms started was at the beginning of the winter when they started running their heat.

## 2014-06-16 ENCOUNTER — Other Ambulatory Visit: Payer: Self-pay | Admitting: *Deleted

## 2014-06-16 MED ORDER — METOPROLOL SUCCINATE ER 25 MG PO TB24: ORAL_TABLET | ORAL | Status: DC

## 2014-06-16 MED ORDER — VENLAFAXINE HCL ER 37.5 MG PO CP24: ORAL_CAPSULE | ORAL | Status: DC

## 2014-10-15 ENCOUNTER — Other Ambulatory Visit: Payer: Self-pay | Admitting: Family Medicine

## 2014-11-22 ENCOUNTER — Other Ambulatory Visit: Payer: Self-pay | Admitting: Family Medicine

## 2014-11-29 ENCOUNTER — Encounter: Payer: Self-pay | Admitting: Family Medicine

## 2014-11-29 DIAGNOSIS — K219 Gastro-esophageal reflux disease without esophagitis: Secondary | ICD-10-CM | POA: Insufficient documentation

## 2014-11-30 LAB — HM PAP SMEAR

## 2014-12-09 ENCOUNTER — Encounter: Payer: Self-pay | Admitting: Physician Assistant

## 2014-12-09 ENCOUNTER — Ambulatory Visit (INDEPENDENT_AMBULATORY_CARE_PROVIDER_SITE_OTHER): Payer: BLUE CROSS/BLUE SHIELD | Admitting: Physician Assistant

## 2014-12-09 VITALS — BP 123/76 | HR 72 | Ht 64.0 in | Wt 144.0 lb

## 2014-12-09 DIAGNOSIS — G43831 Menstrual migraine, intractable, with status migrainosus: Secondary | ICD-10-CM

## 2014-12-09 DIAGNOSIS — G43821 Menstrual migraine, not intractable, with status migrainosus: Secondary | ICD-10-CM | POA: Diagnosis not present

## 2014-12-09 DIAGNOSIS — N943 Premenstrual tension syndrome: Secondary | ICD-10-CM

## 2014-12-09 MED ORDER — KETOROLAC TROMETHAMINE 30 MG/ML IJ SOLN
30.0000 mg | Freq: Once | INTRAMUSCULAR | Status: AC
  Administered 2014-12-09: 30 mg via INTRAMUSCULAR

## 2014-12-09 MED ORDER — PREDNISONE 10 MG PO TABS: ORAL_TABLET | ORAL | Status: DC

## 2014-12-09 NOTE — Progress Notes (Signed)
   Subjective:    Patient ID: Anne Mcdonald, female    DOB: 06-16-1960, 54 y.o.   MRN: [id number]  HPI  Pt presents to the clinic with acute migraine. Pt has hx of migraines with menstrual cycle. She sees headache specialist and on topamax and maxalt. She got her normal HA 2 weeks ago with cycle but has not gone away. She denies any sinus pressure, ear pain, cough, SOB, nasal congestion. No fever or chills. Admits to being under a lot of stress.    Review of Systems  All other systems reviewed and are negative.      Objective:   Physical Exam  Constitutional: She is oriented to person, place, and time. She appears well-developed and well-nourished.  HENT:  Head: Normocephalic and atraumatic.  Right Ear: External ear normal.  Left Ear: External ear normal.  Nose: Nose normal.  Mouth/Throat: Oropharynx is clear and moist.  Eyes: Conjunctivae and EOM are normal. Pupils are equal, round, and reactive to light. Right eye exhibits no discharge. Left eye exhibits no discharge.  Neck: Normal range of motion. Neck supple.  Cardiovascular: Normal rate, regular rhythm and normal heart sounds.   Pulmonary/Chest: Effort normal and breath sounds normal. She has no wheezes.  Lymphadenopathy:    She has no cervical adenopathy.  Neurological: She is alert and oriented to person, place, and time.  Skin: Skin is dry.  Psychiatric: She has a normal mood and affect. Her behavior is normal.          Assessment & Plan:  Migraine-acute exacerbation: toradol 30 mg given in office today. Continue on topamax daily and Maxalt as needed. Follow up with headache specialist is migraines continue to worsen. Consider flonase if having some allergy or sinus symptoms.

## 2014-12-11 ENCOUNTER — Encounter: Payer: Self-pay | Admitting: Physician Assistant

## 2014-12-22 ENCOUNTER — Encounter: Payer: Self-pay | Admitting: Family Medicine

## 2014-12-22 ENCOUNTER — Ambulatory Visit (INDEPENDENT_AMBULATORY_CARE_PROVIDER_SITE_OTHER): Payer: BLUE CROSS/BLUE SHIELD | Admitting: Family Medicine

## 2014-12-22 ENCOUNTER — Other Ambulatory Visit: Payer: Self-pay

## 2014-12-22 VITALS — BP 109/66 | HR 85 | Wt 142.0 lb

## 2014-12-22 DIAGNOSIS — G43001 Migraine without aura, not intractable, with status migrainosus: Secondary | ICD-10-CM | POA: Diagnosis not present

## 2014-12-22 DIAGNOSIS — K589 Irritable bowel syndrome without diarrhea: Secondary | ICD-10-CM

## 2014-12-22 DIAGNOSIS — J309 Allergic rhinitis, unspecified: Secondary | ICD-10-CM | POA: Diagnosis not present

## 2014-12-22 MED ORDER — SUMATRIPTAN SUCCINATE REFILL 6 MG/0.5ML ~~LOC~~ SOCT: 1.0000 | Freq: Every day | SUBCUTANEOUS | Status: DC | PRN

## 2014-12-22 MED ORDER — SUMATRIPTAN SUCCINATE 6 MG/0.5ML ~~LOC~~ SOSY: 6.0000 mg | PREFILLED_SYRINGE | SUBCUTANEOUS | Status: DC | PRN

## 2014-12-22 MED ORDER — LINACLOTIDE 290 MCG PO CAPS: 290.0000 ug | ORAL_CAPSULE | Freq: Every day | ORAL | Status: DC

## 2014-12-22 NOTE — Progress Notes (Signed)
   Subjective:    Patient ID: Anne Mcdonald, female    DOB: 06-10-1960, 54 y.o.   MRN: [id number]  HPI Has had HA for the past month that last all day. Starts in the back of her head and then migrates to the front.  Started after her last period. She has some menstrual migraines.  Hasn't taken anything today for it.  Has been having diarrhea as well.  She came in about a week and a half ago and was treated with IV Toradol injection. This did give her some temporary relief for the rest of the day. She then did a prednisone taper which was helpful at least initially but it made her feel very depressed. After couple of days she started to get the headache back again. She is taking her Topamax regularly and that has not changed. She definitely feels like there is a link between her bowels in her migraines. She says when she's more constipated or when she has diarrhea tends to trigger them. She did miss her period this month but she's perimenopausal.  She also has had some mild sore throat and nasal drainage for about 3 days. No fevers chills or sweats. No cough. No significant sinus congestion.  IBS, constipation predominant-we had send in a perception for Amitiza. Unfortunately her insurance would not pay for it. She wants and if they're any other alternatives that might be covered.  Review of Systems     Objective:   Physical Exam  Constitutional: She is oriented to person, place, and time. She appears well-developed and well-nourished.  HENT:  Head: Normocephalic and atraumatic.  Right Ear: External ear normal.  Left Ear: External ear normal.  Nose: Nose normal.  Mouth/Throat: Oropharynx is clear and moist.  TMs and canals are clear.   Eyes: Conjunctivae and EOM are normal. Pupils are equal, round, and reactive to light.  Neck: Neck supple. No thyromegaly present.  Cardiovascular: Normal rate, regular rhythm and normal heart sounds.   Pulmonary/Chest: Effort normal and breath sounds  normal. She has no wheezes.  Lymphadenopathy:    She has no cervical adenopathy.  Neurological: She is alert and oriented to person, place, and time.  Skin: Skin is warm and dry.  Psychiatric: She has a normal mood and affect.          Assessment & Plan:  Status migrainous-discussed options. I do not recommend repeating the prednisone since it caused some severe depressive feelings which is concerning to me. We could do another Toradol injection but will likely only give her temporary relief again. I like for her to try the Imitrex stat dose and see if that is helpful. New prescription sent to pharmacy. Call if not better in one week.  Allergic rhinitis versus early upper respiratory infection. Recommend sent to Medicare and call if she feels like she's not improving or getting worse. Exam is otherwise normal.  IBS, constipation predominant-it looks like Linzess might be better covered on her insurance plans any perception was sent to the pharmacy today.

## 2015-03-01 ENCOUNTER — Other Ambulatory Visit: Payer: Self-pay | Admitting: Family Medicine

## 2015-03-07 ENCOUNTER — Encounter: Payer: Self-pay | Admitting: Family Medicine

## 2015-03-07 ENCOUNTER — Ambulatory Visit (INDEPENDENT_AMBULATORY_CARE_PROVIDER_SITE_OTHER): Payer: BLUE CROSS/BLUE SHIELD | Admitting: Family Medicine

## 2015-03-07 VITALS — BP 132/76 | HR 72 | Wt 146.0 lb

## 2015-03-07 DIAGNOSIS — G43911 Migraine, unspecified, intractable, with status migrainosus: Secondary | ICD-10-CM | POA: Diagnosis not present

## 2015-03-07 MED ORDER — HYDROCODONE-ACETAMINOPHEN 5-325 MG PO TABS: 1.0000 | ORAL_TABLET | Freq: Four times a day (QID) | ORAL | Status: DC | PRN

## 2015-03-07 MED ORDER — KETOROLAC TROMETHAMINE 60 MG/2ML IM SOLN
60.0000 mg | Freq: Once | INTRAMUSCULAR | Status: AC
  Administered 2015-03-07: 60 mg via INTRAMUSCULAR

## 2015-03-07 MED ORDER — CYCLOBENZAPRINE HCL 10 MG PO TABS: 10.0000 mg | ORAL_TABLET | Freq: Every evening | ORAL | Status: DC | PRN

## 2015-03-07 MED ORDER — SUMATRIPTAN SUCCINATE 6 MG/0.5ML ~~LOC~~ SOSY: 6.0000 mg | PREFILLED_SYRINGE | SUBCUTANEOUS | Status: DC | PRN

## 2015-03-07 NOTE — Progress Notes (Signed)
   Subjective:    Patient ID: Anne Mcdonald, female    DOB: December 04, 1960, 54 y.o.   MRN: [id number]  HPI Says was doing well with her migraines until had her period.  Didn't have her period for almost 3 months.  Now her period started after Thanksgiving and has been in status migrainous since then. Her cycle ended about a weeks ago.  Has a lot of tension in her neck and head.  She has had this HA dialy for over 2 weeks and can't break it. Ran out of Imitrex. Tried to refill it but it was denied.  Rates her headache an 8 out of 10 today.   Review of Systems     Objective:   Physical Exam  Constitutional: She is oriented to person, place, and time. She appears well-developed and well-nourished.  HENT:  Head: Normocephalic and atraumatic.  Eyes: Conjunctivae and EOM are normal.  Cardiovascular: Normal rate.   Pulmonary/Chest: Effort normal.  Neurological: She is alert and oriented to person, place, and time.  Skin: Skin is dry. No pallor.  Psychiatric: She has a normal mood and affect. Her behavior is normal.  Vitals reviewed.         Assessment & Plan:  Status migrainous-will treat with IM Toradol injection acutely. She is having a lot of tension in her neck with this particular headache as well so I gave her a small quantity of Flexeril to use at bedtime when she is home to help relax the muscles. This may help as well. I did warn about potential side effects including and sedation and not to use while driving. Also given a small quantity of hydrocodone to try to break the cycle if needed. Explained will give just a small amount of medication for rare use.

## 2015-03-07 NOTE — Addendum Note (Signed)
Addended by: Huel Cote on: 03/07/2015 02:25 PM   Modules accepted: Orders

## 2015-03-28 ENCOUNTER — Other Ambulatory Visit: Payer: Self-pay | Admitting: Family Medicine

## 2015-04-03 ENCOUNTER — Ambulatory Visit (INDEPENDENT_AMBULATORY_CARE_PROVIDER_SITE_OTHER): Payer: BLUE CROSS/BLUE SHIELD | Admitting: Family Medicine

## 2015-04-03 ENCOUNTER — Encounter: Payer: Self-pay | Admitting: Family Medicine

## 2015-04-03 VITALS — BP 114/76 | HR 95 | Wt 145.0 lb

## 2015-04-03 DIAGNOSIS — G43009 Migraine without aura, not intractable, without status migrainosus: Secondary | ICD-10-CM

## 2015-04-03 DIAGNOSIS — Z1239 Encounter for other screening for malignant neoplasm of breast: Secondary | ICD-10-CM | POA: Diagnosis not present

## 2015-04-03 DIAGNOSIS — K589 Irritable bowel syndrome without diarrhea: Secondary | ICD-10-CM

## 2015-04-03 DIAGNOSIS — F17200 Nicotine dependence, unspecified, uncomplicated: Secondary | ICD-10-CM | POA: Diagnosis not present

## 2015-04-03 MED ORDER — TOPIRAMATE 25 MG PO TABS: ORAL_TABLET | ORAL | Status: DC

## 2015-04-03 MED ORDER — GABAPENTIN 100 MG PO CAPS: ORAL_CAPSULE | ORAL | Status: DC

## 2015-04-03 MED ORDER — OMEPRAZOLE 40 MG PO CPDR: DELAYED_RELEASE_CAPSULE | ORAL | Status: DC

## 2015-04-03 NOTE — Patient Instructions (Signed)
Taper the topamax.  Instruction will be on the bottle.  Once you get down to 2 tabs daily then you can start the gabapentin.

## 2015-04-03 NOTE — Progress Notes (Signed)
   Subjective:    Patient ID: Anne Mcdonald, female    DOB: [birth year], 55 y.o.   MRN: [id number]  HPI Chronic daily HA with her migraines.  - she has continued to have persistent daily headaches for the last several weeks. In the last year she's been in very frequently for migraines attend the last several days. She currently follows with Dr. Mart Piggs at the headache clinic in [place]. They have not changed or adjusted her regimen at all this year. [name] currently taking 200 mg of Topamax daily. See him for her headaches but felt like it was actually causing diarrhea so stopped it. When I last saw her for status migrainous I did give her prescription of Flexeril to use. She says it really doesn't didn't help.  She is currently on Effexor and metoprolol. She has never tried Botox injections for her migraines. Says her maxalt works well. Tries not to overuse it.    IBS - says the magnesium with the Linzess was too much together and it caused diarrhea so she stopped the magnesium. She is now just back on the Linzess by itself and is doing well passing soft stools.  Her husband is with her today for the office visit.  She also wanted to discuss smoking cessation today. She says she knows she needs to quit.  Review of Systems     Objective:   Physical Exam  Constitutional: She is oriented to person, place, and time. She appears well-developed and well-nourished.  HENT:  Head: Normocephalic and atraumatic.  Eyes: Conjunctivae and EOM are normal.  Cardiovascular: Normal rate.   Pulmonary/Chest: Effort normal.  Neurological: She is alert and oriented to person, place, and time.  Skin: Skin is dry. No pallor.  Psychiatric: She has a normal mood and affect. Her behavior is normal.  Vitals reviewed.         Assessment & Plan:  Migraine headache with chronic daily headache-we discussed several options. At this point I think we need to consider changing her regimen. I feel like the  Topamax is not effective for her. She has never tried gabapentin. She is are any on a beta blocker and an S NRI. Her Maxalt she feels is effective and works well. I'm also going to refer her to [facility] or headache specialist here at the med center. Now that she is no longer working we also discussed strategies to increase her activity level and to really start exercising more regularly. I think this would help reduce her headaches as well.  IBS-doing well with Linzess.  For mammogram screening. Order placed.  Tobacco abuse-we did discuss quitting smoking and strategies to that including medication, nicotine replacement. We also discussed using a quick program to assist her.

## 2015-04-04 ENCOUNTER — Encounter: Payer: Self-pay | Admitting: Family Medicine

## 2015-04-12 ENCOUNTER — Telehealth: Payer: Self-pay

## 2015-04-12 ENCOUNTER — Ambulatory Visit (INDEPENDENT_AMBULATORY_CARE_PROVIDER_SITE_OTHER): Payer: BLUE CROSS/BLUE SHIELD

## 2015-04-12 DIAGNOSIS — Z1239 Encounter for other screening for malignant neoplasm of breast: Secondary | ICD-10-CM

## 2015-04-12 DIAGNOSIS — Z1231 Encounter for screening mammogram for malignant neoplasm of breast: Secondary | ICD-10-CM | POA: Diagnosis not present

## 2015-04-12 MED ORDER — VENLAFAXINE HCL ER 150 MG PO CP24: 150.0000 mg | ORAL_CAPSULE | Freq: Every day | ORAL | Status: DC

## 2015-04-12 NOTE — Telephone Encounter (Signed)
Left message advising of recommendations.  

## 2015-04-12 NOTE — Telephone Encounter (Signed)
Patient states she is having mood swings and would like her Effexor increased. Please advise.

## 2015-04-12 NOTE — Telephone Encounter (Signed)
New rx sent for 150mg .  She was taking 2 of the 37.5mg  before.

## 2015-04-18 ENCOUNTER — Encounter: Payer: Self-pay | Admitting: Family Medicine

## 2015-05-02 ENCOUNTER — Ambulatory Visit (INDEPENDENT_AMBULATORY_CARE_PROVIDER_SITE_OTHER): Payer: BLUE CROSS/BLUE SHIELD | Admitting: Physician Assistant

## 2015-05-02 ENCOUNTER — Encounter: Payer: Self-pay | Admitting: Physician Assistant

## 2015-05-02 VITALS — BP 120/82 | HR 72 | Resp 16 | Ht 64.0 in | Wt 147.0 lb

## 2015-05-02 DIAGNOSIS — G43709 Chronic migraine without aura, not intractable, without status migrainosus: Secondary | ICD-10-CM | POA: Insufficient documentation

## 2015-05-02 DIAGNOSIS — M62838 Other muscle spasm: Secondary | ICD-10-CM | POA: Insufficient documentation

## 2015-05-02 DIAGNOSIS — G4441 Drug-induced headache, not elsewhere classified, intractable: Secondary | ICD-10-CM

## 2015-05-02 DIAGNOSIS — G4440 Drug-induced headache, not elsewhere classified, not intractable: Secondary | ICD-10-CM | POA: Insufficient documentation

## 2015-05-02 MED ORDER — BACLOFEN 10 MG PO TABS: 10.0000 mg | ORAL_TABLET | Freq: Three times a day (TID) | ORAL | Status: DC

## 2015-05-02 MED ORDER — RIZATRIPTAN BENZOATE 10 MG PO TABS: 10.0000 mg | ORAL_TABLET | ORAL | Status: DC | PRN

## 2015-05-02 MED ORDER — SUMATRIPTAN SUCCINATE 6 MG/0.5ML ~~LOC~~ SOLN: 6.0000 mg | SUBCUTANEOUS | Status: DC | PRN

## 2015-05-02 MED ORDER — GABAPENTIN 100 MG PO CAPS: 200.0000 mg | ORAL_CAPSULE | Freq: Four times a day (QID) | ORAL | Status: DC

## 2015-05-02 NOTE — Progress Notes (Signed)
Patient ID: Anne Mcdonald, female   DOB: 12-16-1960, 55 y.o.   MRN: [id number] History:  Anne Mcdonald is a 55 y.o.  who presents to clinic today for evaluation of headache.  They started a few years ago and were associated with menses.  Now, she is perimenopausal and does not always have a period.  It is located mid frontal or bilat occipital.  Previously it was more left sided.  There is throbbing.  There is nausea and photophobia and phonophobia.  Movement makes it worse.  They last all day.  No vision changes.  She gets chills prior to HA.  They come on after having a bowel movement - no matter whether there is diarrhea or constipation.  There are occasional sharp shooting pains that last a couple of minutes.     She was on Topamax but has discontinued in favor of Gabapentin 200mg  at night.  Venlafaxine was just doubled (150mg ) 2 weeks ago.   She uses Excedrin migraine, Imitrex injectable or Maxalt tablet.  She uses one of these medications nearly every day.   She went to the [facility] but has now stopped going there.  She had received injections but is uncertain if that was helpful.     HIT6:74 Number of days in the last 4 weeks with:  Severe headache: 12 Moderate headache: 13 Mild headache: 0  No headache: 3   Past Medical History  Diagnosis Date  . PUD (peptic ulcer disease)     Social History   Social History  . Marital Status: Married    Spouse Name: N/A  . Number of Children: N/A  . Years of Education: N/A   Occupational History  . Not on file.   Social History Main Topics  . Smoking status: Current Every Day Smoker -- 1.50 packs/day    Types: Cigarettes  . Smokeless tobacco: Not on file  . Alcohol Use: Yes  . Drug Use: No  . Sexual Activity: Not on file   Other Topics Concern  . Not on file   Social History Narrative    History reviewed. No pertinent family history.  No Known Allergies  Current Outpatient Prescriptions on File Prior  to Visit  Medication Sig Dispense Refill  . gabapentin (NEURONTIN) 100 MG capsule One po QHS x 1 week, then increase to 2 tabs at bedtime. 60 capsule 0  . Iron-FA-B Cmp-C-Biot-Probiotic (FUSION PLUS) CAPS TAKE 1 CAPSULE BY MOUTH EVERY DAY 30 capsule 6  . Linaclotide (LINZESS) 290 MCG CAPS capsule Take 1 capsule (290 mcg total) by mouth daily. 30 capsule 5  . metoprolol succinate (TOPROL-XL) 25 MG 24 hr tablet TAKE 1/2 TABLET BY MOUTH EVERY DAY 90 tablet 3  . omeprazole (PRILOSEC) 40 MG capsule TAKE 1 CAPSULE (40 MG TOTAL) BY MOUTH DAILY. 90 capsule 1  . rizatriptan (MAXALT) 10 MG tablet   1  . venlafaxine XR (EFFEXOR-XR) 150 MG 24 hr capsule Take 1 capsule (150 mg total) by mouth daily with breakfast. 90 capsule 0   No current facility-administered medications on file prior to visit.     Review of Systems:  All pertinent positive/negative included in HPI, all other review of systems are negative  Objective:  Physical Exam Ht 5\' 4"  (1.626 m)  Wt 147 lb (66.679 kg)  BMI 25.22 kg/m2  Ht 5\' 4"  (1.626 m)  Wt 147 lb (66.679 kg)  BMI 25.22 kg/m2 120/77  CONSTITUTIONAL: Well-developed, well-nourished female in no acute distress.  EYES: EOM intact  ENT: Normocephalic CARDIOVASCULAR: Regular rate and rhythm with no adventitious sounds.  RESPIRATORY: Normal rate. Diminished air flow (smoker).  ENDOCRINE: Normal thyroid.  MUSCULOSKELETAL: Normal ROM, strength equal bilaterally, trapezius muscle spasm noted SKIN: Warm, dry without erythema  NEUROLOGICAL: Alert, oriented, CN II-XII grossly intact, Appropriate balance (L>R), No dysmetria, Sensation equal bilaterally, Romberg negative.   PSYCH: Normal behavior, mood   Assessment & Plan:  Assessment: Chronic migraine without aura Muscle spasm  Plan: Will increase gabapentin.  She is presently on 200mg  at night.  She is to increase by 100mg  increments up to 200mg  TID.  She may then use 100mg  prn up to bid for acute pain.   Pt already on  venlafaxine at 150mg  qd.  Discussed benefits of this medication for migraine prevention however will maintain current dose as pt just increased to this level.  Metoprolol also helpful for migraine prevention.  Will not alter.  Refill Sumatriptan injectable and Maxalt tablet.  Pt NOT TO USE THESE 2 medications within 48 hours of each other.  Discontinue Excedrin Discontinue Smoking Begin regular exercise program Baclofen prn muscle spasm/headache.  Also encouraged to use heat packs.   Follow-up in 6 months or sooner PRN  [name], PA-C 05/02/2015 1:33 PM

## 2015-05-02 NOTE — Progress Notes (Signed)
error 

## 2015-05-02 NOTE — Patient Instructions (Signed)

## 2015-05-26 ENCOUNTER — Other Ambulatory Visit: Payer: Self-pay | Admitting: Family Medicine

## 2015-05-27 ENCOUNTER — Other Ambulatory Visit: Payer: Self-pay | Admitting: Family Medicine

## 2015-06-04 ENCOUNTER — Encounter: Payer: Self-pay | Admitting: Emergency Medicine

## 2015-06-04 ENCOUNTER — Emergency Department
Admission: EM | Admit: 2015-06-04 | Discharge: 2015-06-04 | Disposition: A | Discharge status: Home / Self Care | Payer: BLUE CROSS/BLUE SHIELD | Source: Home / Self Care | Attending: Family Medicine | Admitting: Family Medicine

## 2015-06-04 DIAGNOSIS — R6889 Other general symptoms and signs: Secondary | ICD-10-CM

## 2015-06-04 MED ORDER — OSELTAMIVIR PHOSPHATE 75 MG PO CAPS: 75.0000 mg | ORAL_CAPSULE | Freq: Two times a day (BID) | ORAL | Status: DC

## 2015-06-04 MED ORDER — BENZONATATE 100 MG PO CAPS: 100.0000 mg | ORAL_CAPSULE | Freq: Three times a day (TID) | ORAL | Status: DC

## 2015-06-04 NOTE — Discharge Instructions (Signed)

## 2015-06-04 NOTE — ED Provider Notes (Signed)
CSN: [id number]     Arrival date & time 06/04/15  1744 History   First MD Initiated Contact with Patient 06/04/15 1804     Chief Complaint  Patient presents with  . URI   (Consider location/radiation/quality/duration/timing/severity/associated sxs/prior Treatment) HPI  The pt is a 55yo female presenting to [facility] with c/o mild intermittent productive cough, congestion, body aches, and fatigue for 2 days.  She has been taking ibuprofen with minimal relief.  She has subjective fever. Denies n/v/d.  She did not get the flu vaccine this season.  Past Medical History  Diagnosis Date  . PUD (peptic ulcer disease)    Past Surgical History  Procedure Laterality Date  . Cholecystectomy    . Cesarean section     No family history on file. Social History  Substance Use Topics  . Smoking status: Current Every Day Smoker -- 1.50 packs/day    Types: Cigarettes  . Smokeless tobacco: None  . Alcohol Use: Yes   OB History    No data available     Review of Systems  Constitutional: Positive for fever and chills.  HENT: Positive for congestion, rhinorrhea, sneezing and sore throat. Negative for ear pain, trouble swallowing and voice change.   Respiratory: Positive for cough. Negative for shortness of breath.   Cardiovascular: Negative for chest pain and palpitations.  Gastrointestinal: Negative for nausea, vomiting, abdominal pain and diarrhea.  Musculoskeletal: Positive for myalgias and arthralgias. Negative for back pain.  Skin: Negative for rash.  Neurological: Positive for headaches. Negative for dizziness and light-headedness.    Allergies  Review of patient's allergies indicates no known allergies.  Home Medications   Prior to Admission medications   Medication Sig Start Date End Date Taking? Authorizing Provider  baclofen (LIORESAL) 10 MG tablet Take 1 tablet (10 mg total) by mouth 3 (three) times daily. 05/02/15   [name], PA-C  benzonatate (TESSALON) 100 MG capsule  Take 1-2 capsules (100-200 mg total) by mouth every 8 (eight) hours. 06/04/15   Noland Fordyce, PA-C  gabapentin (NEURONTIN) 100 MG capsule One po QHS x 1 week, then increase to 2 tabs at bedtime. 04/03/15   Hali Marry, MD  gabapentin (NEURONTIN) 100 MG capsule Take 2 capsules (200 mg total) by mouth 4 (four) times daily. Titrate to the above dosing with weekly increases 05/02/15   [name], PA-C  Iron-FA-B Cmp-C-Biot-Probiotic (FUSION PLUS) CAPS TAKE 1 CAPSULE BY MOUTH EVERY DAY 05/26/15   Hali Marry, MD  Linaclotide [facility]) 290 MCG CAPS capsule Take 1 capsule (290 mcg total) by mouth daily. 12/22/14   Hali Marry, MD  metoprolol succinate (TOPROL-XL) 25 MG 24 hr tablet TAKE 1/2 TABLET BY MOUTH EVERY DAY 06/16/14   Hali Marry, MD  omeprazole (PRILOSEC) 40 MG capsule TAKE 1 CAPSULE (40 MG TOTAL) BY MOUTH DAILY. 04/03/15   Hali Marry, MD  oseltamivir (TAMIFLU) 75 MG capsule Take 1 capsule (75 mg total) by mouth every 12 (twelve) hours. 06/04/15   Noland Fordyce, PA-C  rizatriptan (MAXALT) 10 MG tablet Take 1 tablet (10 mg total) by mouth as needed for migraine (may repeat after 2 hours.  max of 2 doses in 24 hours). 05/02/15   [name], PA-C  SUMAtriptan (IMITREX) 6 MG/0.5ML SOLN injection Inject 0.5 mLs (6 mg total) into the skin every 2 (two) hours as needed for migraine or headache. May repeat in 2 hours if headache persists or recurs. 05/02/15  [name], PA-C  SUMAtriptan (IMITREX) 6 MG/0.5ML SOSY injection INJECT 0.5 MLS (6 MG TOTAL) INTO THE SKIN EVERY 2 (TWO) HOURS AS NEEDED FOR MIGRAINE OR HEADACHE. F 05/26/15   Hali Marry, MD  venlafaxine XR (EFFEXOR-XR) 150 MG 24 hr capsule TAKE 1 CAPSULE (150 MG TOTAL) BY MOUTH DAILY WITH BREAKFAST. 05/29/15   Hali Marry, MD   Meds Ordered and Administered this Visit  Medications - No data to display  BP 101/72 mmHg  Pulse 110  Temp(Src) 98.1 F (36.7 C) (Oral)  Ht 5'  4" (1.626 m)  Wt 149 lb 8 oz (67.813 kg)  BMI 25.65 kg/m2  SpO2 97% No data found.   Physical Exam  Constitutional: She appears well-developed and well-nourished. No distress.  Pt sitting on exam table, appears fatigued.   HENT:  Head: Normocephalic and atraumatic.  Right Ear: Tympanic membrane normal.  Left Ear: Tympanic membrane normal.  Nose: Rhinorrhea present.  Mouth/Throat: Uvula is midline, oropharynx is clear and moist and mucous membranes are normal.  Eyes: Conjunctivae are normal. No scleral icterus.  Neck: Normal range of motion. Neck supple.  Cardiovascular: Regular rhythm and normal heart sounds.  Tachycardia present.   Pulmonary/Chest: Effort normal and breath sounds normal. No respiratory distress. She has no wheezes. She has no rales.  Abdominal: Soft. She exhibits no distension. There is no tenderness.  Musculoskeletal: Normal range of motion.  Neurological: She is alert.  Skin: Skin is warm and dry. She is not diaphoretic.  Nursing note and vitals reviewed.   ED Course  Procedures (including critical care time)  Labs Review Labs Reviewed - No data to display  Imaging Review No results found.    MDM   1. Flu-like symptoms    Pt c/o flu-like symptoms for 2 days. No evidence of bacterial infection at this time.   Will treat for flu. Rx: Tamiflu and tessalon. Encouraged fluids, rest, acetaminophen, and ibuprofen.   Pt will be driving to [place] on Friday and hopes to be better before then.  Advised to call office or follow up with PCP if symptoms not improving with Tamiflu and Tessalon or if fever persists.  Patient verbalized understanding and agreement with treatment plan.     Noland Fordyce, PA-C 06/05/15 1323

## 2015-06-04 NOTE — ED Notes (Signed)
Pt c/o cough since Friday, head congestion, body aches, fatigue feeling

## 2015-06-22 ENCOUNTER — Other Ambulatory Visit: Payer: Self-pay | Admitting: Family Medicine

## 2015-06-24 ENCOUNTER — Encounter: Payer: Self-pay | Admitting: Emergency Medicine

## 2015-06-24 ENCOUNTER — Emergency Department (INDEPENDENT_AMBULATORY_CARE_PROVIDER_SITE_OTHER)
Admission: EM | Admit: 2015-06-24 | Discharge: 2015-06-24 | Disposition: A | Discharge status: Home / Self Care | Payer: BLUE CROSS/BLUE SHIELD | Source: Home / Self Care | Attending: Family Medicine | Admitting: Family Medicine

## 2015-06-24 ENCOUNTER — Emergency Department (INDEPENDENT_AMBULATORY_CARE_PROVIDER_SITE_OTHER): Payer: BLUE CROSS/BLUE SHIELD

## 2015-06-24 DIAGNOSIS — R079 Chest pain, unspecified: Secondary | ICD-10-CM | POA: Diagnosis not present

## 2015-06-24 DIAGNOSIS — M940 Chondrocostal junction syndrome [Tietze]: Secondary | ICD-10-CM

## 2015-06-24 MED ORDER — MELOXICAM 15 MG PO TABS: 15.0000 mg | ORAL_TABLET | Freq: Every day | ORAL | Status: DC

## 2015-06-24 NOTE — ED Notes (Signed)
Pt c/o right sided pain that radiates across breast since Thursday.  Pt has started working out using different machines and not sure if it's a pulled muscle or not.

## 2015-06-24 NOTE — ED Provider Notes (Signed)
CSN: [id number]     Arrival date & time 06/24/15  1351 History   First MD Initiated Contact with Patient 06/24/15 1421     Chief Complaint  Patient presents with  . Chest Injury      HPI Comments: The patient awoke 48 hours ago with constant pain in her right anterior chest.  She recalls no injury, but admits that she has been working out and using different machines.  She feels well otherwise.  She notes that she had had the flu about 2 weeks ago that resolved completely.  Patient is a 55 y.o. female presenting with chest pain. The history is provided by the patient and the spouse.  Chest Pain Pain location:  R chest Pain quality: sharp   Pain radiates to:  Does not radiate Pain severity:  Moderate Onset quality:  Sudden Duration:  2 days Timing:  Constant Chronicity:  New Context: breathing, movement, raising an arm and at rest   Relieved by:  Nothing Worsened by:  Certain positions and movement Ineffective treatments: Ibuprofen. Associated symptoms: no abdominal pain, no AICD problem, no back pain, no cough, no diaphoresis, no dysphagia, no fever, no heartburn, no lower extremity edema, no nausea, no palpitations, no shortness of breath, not vomiting and no weakness     Past Medical History  Diagnosis Date  . PUD (peptic ulcer disease)    Past Surgical History  Procedure Laterality Date  . Cholecystectomy    . Cesarean section     No family history on file. Social History  Substance Use Topics  . Smoking status: Current Every Day Smoker -- 1.50 packs/day    Types: Cigarettes  . Smokeless tobacco: None  . Alcohol Use: Yes   OB History    No data available     Review of Systems  Constitutional: Negative for fever and diaphoresis.  HENT: Negative for trouble swallowing.   Respiratory: Negative for cough and shortness of breath.   Cardiovascular: Positive for chest pain. Negative for palpitations.  Gastrointestinal: Negative for heartburn, nausea, vomiting and  abdominal pain.  Musculoskeletal: Negative for back pain.  Neurological: Negative for weakness.  All other systems reviewed and are negative.   Allergies  Review of patient's allergies indicates no known allergies.  Home Medications   Prior to Admission medications   Medication Sig Start Date End Date Taking? Authorizing Provider  baclofen (LIORESAL) 10 MG tablet Take 1 tablet (10 mg total) by mouth 3 (three) times daily. 05/02/15   [name], PA-C  benzonatate (TESSALON) 100 MG capsule Take 1-2 capsules (100-200 mg total) by mouth every 8 (eight) hours. 06/04/15   Noland Fordyce, PA-C  gabapentin (NEURONTIN) 100 MG capsule One po QHS x 1 week, then increase to 2 tabs at bedtime. 04/03/15   Hali Marry, MD  gabapentin (NEURONTIN) 100 MG capsule Take 2 capsules (200 mg total) by mouth 4 (four) times daily. Titrate to the above dosing with weekly increases 05/02/15   [name], PA-C  Iron-FA-B Cmp-C-Biot-Probiotic (FUSION PLUS) CAPS TAKE 1 CAPSULE BY MOUTH EVERY DAY 05/26/15   Hali Marry, MD  LINZESS 290 MCG CAPS capsule TAKE 1 CAPSULE (290 MCG TOTAL) BY MOUTH DAILY. 06/22/15   Hali Marry, MD  meloxicam (MOBIC) 15 MG tablet Take 1 tablet (15 mg total) by mouth daily. Take with food each morning 06/24/15   Kandra Nicolas, MD  metoprolol succinate (TOPROL-XL) 25 MG 24 hr tablet TAKE 1/2 TABLET BY MOUTH  EVERY DAY 06/16/14   Hali Marry, MD  omeprazole (PRILOSEC) 40 MG capsule TAKE 1 CAPSULE (40 MG TOTAL) BY MOUTH DAILY. 04/03/15   Hali Marry, MD  oseltamivir (TAMIFLU) 75 MG capsule Take 1 capsule (75 mg total) by mouth every 12 (twelve) hours. 06/04/15   Noland Fordyce, PA-C  rizatriptan (MAXALT) 10 MG tablet Take 1 tablet (10 mg total) by mouth as needed for migraine (may repeat after 2 hours.  max of 2 doses in 24 hours). 05/02/15   [name], PA-C  SUMAtriptan (IMITREX) 6 MG/0.5ML SOLN injection Inject 0.5 mLs (6 mg total) into the  skin every 2 (two) hours as needed for migraine or headache. May repeat in 2 hours if headache persists or recurs. 05/02/15   [name], PA-C  SUMAtriptan (IMITREX) 6 MG/0.5ML SOSY injection INJECT 0.5 MLS (6 MG TOTAL) INTO THE SKIN EVERY 2 (TWO) HOURS AS NEEDED FOR MIGRAINE OR HEADACHE. F 05/26/15   Hali Marry, MD  venlafaxine XR (EFFEXOR-XR) 150 MG 24 hr capsule TAKE 1 CAPSULE (150 MG TOTAL) BY MOUTH DAILY WITH BREAKFAST. 05/29/15   Hali Marry, MD   Meds Ordered and Administered this Visit  Medications - No data to display  BP 110/72 mmHg  Pulse 82  Temp(Src) 98.3 F (36.8 C) (Oral)  Ht 5\' 4"  (1.626 m)  Wt 156 lb 12 oz (71.101 kg)  BMI 26.89 kg/m2  SpO2 95%  LMP 01/24/2015 No data found.   Physical Exam  Constitutional: She is oriented to person, place, and time. She appears well-developed and well-nourished. No distress.  HENT:  Head: Atraumatic.  Mouth/Throat: Oropharynx is clear and moist.  Eyes: Conjunctivae are normal. Pupils are equal, round, and reactive to light.  Neck: Neck supple.  Cardiovascular: Normal heart sounds.   Pulmonary/Chest: Breath sounds normal. She exhibits tenderness.    There is tenderness to palpation over right ribs and sternum as noted on diagram.  Pain is elicited by palpating the right pectoralis muscle during resisted contraction.    Abdominal: There is no tenderness.  Musculoskeletal: She exhibits no edema or tenderness.  Lymphadenopathy:    She has no cervical adenopathy.  Neurological: She is alert and oriented to person, place, and time.  Skin: Skin is warm and dry. No rash noted.  Nursing note and vitals reviewed.   ED Course  Procedures none    Imaging Review Dg Ribs Unilateral W/chest Right  06/24/2015  CLINICAL DATA:  Acute right chest pain. EXAM: RIGHT RIBS AND CHEST - 3+ VIEW COMPARISON:  03/01/2014 FINDINGS: The cardiomediastinal silhouette is unremarkable. There is no evidence of focal airspace  disease, pulmonary edema, suspicious pulmonary nodule/mass, pleural effusion, or pneumothorax. No acute bony abnormalities are identified. Cholecystectomy clips are identified. IMPRESSION: Negative. Electronically Signed   By: Margarette Canada M.D.   On: 06/24/2015 15:01      MDM   1. Costochondritis    Begin Mobic 15mg  QAM Apply ice pack for 20 to 30 minutes, 3 to 4 times daily  Continue until pain decreases.  Followup with Dr. Aundria Mems or Dr. Lynne Leader ([facility]) if not improving about two weeks.     Kandra Nicolas, MD 06/26/15 [phone]

## 2015-06-24 NOTE — Discharge Instructions (Signed)
Apply ice pack for 20 to 30 minutes, 3 to 4 times daily  Continue until pain decreases.    Costochondritis Costochondritis, sometimes called Tietze syndrome, is a swelling and irritation (inflammation) of the tissue (cartilage) that connects your ribs with your breastbone (sternum). It causes pain in the chest and rib area. Costochondritis usually goes away on its own over time. It can take up to 6 weeks or longer to get better, especially if you are unable to limit your activities. CAUSES  Some cases of costochondritis have no known cause. Possible causes include:  Injury (trauma).  Exercise or activity such as lifting.  Severe coughing. SIGNS AND SYMPTOMS  Pain and tenderness in the chest and rib area.  Pain that gets worse when coughing or taking deep breaths.  Pain that gets worse with specific movements. DIAGNOSIS  Your health care provider will do a physical exam and ask about your symptoms. Chest X-rays or other tests may be done to rule out other problems. TREATMENT  Costochondritis usually goes away on its own over time. Your health care provider may prescribe medicine to help relieve pain. HOME CARE INSTRUCTIONS   Avoid exhausting physical activity. Try not to strain your ribs during normal activity. This would include any activities using chest, abdominal, and side muscles, especially if heavy weights are used.  Apply ice to the affected area for the first 2 days after the pain begins.  Put ice in a plastic bag.  Place a towel between your skin and the bag.  Leave the ice on for 20 minutes, 2-3 times a day.  Only take over-the-counter or prescription medicines as directed by your health care provider. SEEK MEDICAL CARE IF:  You have redness or swelling at the rib joints. These are signs of infection.  Your pain does not go away despite rest or medicine. SEEK IMMEDIATE MEDICAL CARE IF:   Your pain increases or you are very uncomfortable.  You have shortness of  breath or difficulty breathing.  You cough up blood.  You have worse chest pains, sweating, or vomiting.  You have a fever or persistent symptoms for more than 2-3 days.  You have a fever and your symptoms suddenly get worse. MAKE SURE YOU:   Understand these instructions.  Will watch your condition.  Will get help right away if you are not doing well or get worse.   This information is not intended to replace advice given to you by your health care provider. Make sure you discuss any questions you have with your health care provider.   Document Released: 12/19/2004 Document Revised: 12/30/2012 Document Reviewed: 10/13/2012 Elsevier Interactive Patient Education [employer].

## 2015-07-26 ENCOUNTER — Ambulatory Visit (INDEPENDENT_AMBULATORY_CARE_PROVIDER_SITE_OTHER): Payer: BLUE CROSS/BLUE SHIELD | Admitting: Family Medicine

## 2015-07-26 ENCOUNTER — Encounter: Payer: Self-pay | Admitting: Family Medicine

## 2015-07-26 VITALS — BP 110/74 | HR 88 | Wt 154.0 lb

## 2015-07-26 DIAGNOSIS — G43009 Migraine without aura, not intractable, without status migrainosus: Secondary | ICD-10-CM

## 2015-07-26 DIAGNOSIS — R0789 Other chest pain: Secondary | ICD-10-CM

## 2015-07-26 DIAGNOSIS — E781 Pure hyperglyceridemia: Secondary | ICD-10-CM

## 2015-07-26 DIAGNOSIS — F3281 Premenstrual dysphoric disorder: Secondary | ICD-10-CM | POA: Diagnosis not present

## 2015-07-26 LAB — COMPLETE METABOLIC PANEL WITH GFR
ALT: 15 U/L (ref 6–29)
AST: 18 U/L (ref 10–35)
Albumin: 4.3 g/dL (ref 3.6–5.1)
Alkaline Phosphatase: 55 U/L (ref 33–130)
BILIRUBIN TOTAL: 0.6 mg/dL (ref 0.2–1.2)
BUN: 11 mg/dL (ref 7–25)
CHLORIDE: 104 mmol/L (ref 98–110)
CO2: 25 mmol/L (ref 20–31)
Calcium: 9.4 mg/dL (ref 8.6–10.4)
Creat: 0.75 mg/dL (ref 0.50–1.05)
GFR, Est African American: >89 mL/min (ref >=60)
GLUCOSE: 81 mg/dL (ref 65–99)
POTASSIUM: 4.7 mmol/L (ref 3.5–5.3)
SODIUM: 138 mmol/L (ref 135–146)
TOTAL PROTEIN: 6.5 g/dL (ref 6.1–8.1)

## 2015-07-26 LAB — LIPID PANEL
Cholesterol: 190 mg/dL (ref 125–200)
HDL: 58 mg/dL (ref >=46)
LDL CALC: 99 mg/dL (ref <130)
Total CHOL/HDL Ratio: 3.3 Ratio (ref <=5.0)
Triglycerides: 164 mg/dL — ABNORMAL HIGH (ref <150)
VLDL: 33 mg/dL — AB (ref <30)

## 2015-07-26 MED ORDER — VENLAFAXINE HCL ER 75 MG PO CP24: 225.0000 mg | ORAL_CAPSULE | Freq: Every day | ORAL | Status: DC

## 2015-07-26 MED ORDER — MELOXICAM 15 MG PO TABS: 15.0000 mg | ORAL_TABLET | Freq: Every day | ORAL | Status: DC | PRN

## 2015-07-26 NOTE — Progress Notes (Signed)
   Subjective:    Patient ID: Anne Mcdonald, female    DOB: 1960-09-25, 55 y.o.   MRN: [id number]  HPI 55 year old female is here today to follow-up for recent emergency department visit at the [facility], [facility]. She was seen on April 24 of this year complaining of chest pain headache and nausea. She said she had worked out for an hour and a half that morning and then again the evening worked out for another hour and a half doing Pramila cardio at her local gym. After the workout she started having a sharp pain in her chest with some tightness nausea and headache. She also complained of feeling lightheaded and feeling like her heart was not beating correctly. The did a blood count or hemoglobin was 12.8. Sodium was a little bit low. Glucose was normal. Liver and kidney function were normal. She had a negative d-dimer. Did do a chest x-ray which was normal. An EKG which showed normal sinus rhythm. Her blood pressure was normal at 101/62. Pulse ox was normal as well. She was discharged home. Since then she Has been doing well and has not had any more chest pain. She says in retrospect she had actually run out of her gabapentin as well as her Effexor for 5 days and thinks that that may have actually triggered the symptoms.  Migraine headaches-she's been doing well overall on the gabapentin.  Review of Systems     Objective:   Physical Exam  Constitutional: She is oriented to person, place, and time. She appears well-developed and well-nourished.  HENT:  Head: Normocephalic and atraumatic.  Neck: Neck supple. No thyromegaly present.  Cardiovascular: Normal rate, regular rhythm and normal heart sounds.   Pulmonary/Chest: Effort normal and breath sounds normal.  Lymphadenopathy:    She has no cervical adenopathy.  Neurological: She is alert and oriented to person, place, and time.  Skin: Skin is warm and dry.  Psychiatric: She has a normal mood and affect. Her  behavior is normal.          Assessment & Plan:  Atypical chest pain-resolved. Most likely withdrawal from her gabapentin and her Effexor which she had been unable to take for 5 days until she is able to get to the pharmacy. [name] back on her medication and says within a couple days she felt back to her old self. She is due for screening lipid panel so we will get that updated today.  PMDD-she has felt just a little bit more weepy and sad recently. Her youngest daughter of 5 is leaving home to start college in the fall. She is given a little bit more emotional and wants to know if we can bump up her Effexor. [name] currently taking 150 mg daily. Will increase to 225 mg. Follow-up in 6 months. I did ask her to call me back if she feels like the increased dose is not more effective, then the 150 mg dose. If it's not then we will just go back down.  Hypertriglyceridemia-due to recheck lipid panel.  Migraine headaches without aura-doing well overall with her gabapentin. Did update her medication list.

## 2015-08-26 ENCOUNTER — Emergency Department
Admission: EM | Admit: 2015-08-26 | Discharge: 2015-08-26 | Disposition: A | Discharge status: Home / Self Care | Payer: BLUE CROSS/BLUE SHIELD | Source: Home / Self Care | Attending: Family Medicine | Admitting: Family Medicine

## 2015-08-26 ENCOUNTER — Encounter: Payer: Self-pay | Admitting: Emergency Medicine

## 2015-08-26 DIAGNOSIS — G43009 Migraine without aura, not intractable, without status migrainosus: Secondary | ICD-10-CM | POA: Diagnosis not present

## 2015-08-26 MED ORDER — SUMATRIPTAN SUCCINATE 6 MG/0.5ML ~~LOC~~ SOLN
6.0000 mg | Freq: Once | SUBCUTANEOUS | Status: AC
  Administered 2015-08-26: 6 mg via SUBCUTANEOUS

## 2015-08-26 MED ORDER — SUMATRIPTAN SUCCINATE 50 MG PO TABS: 50.0000 mg | ORAL_TABLET | ORAL | Status: DC | PRN

## 2015-08-26 NOTE — ED Provider Notes (Signed)
CSN: [id number]     Arrival date & time 08/26/15  1803 History   First MD Initiated Contact with Patient 08/26/15 [place]     Chief Complaint  Patient presents with  . Headache   (Consider location/radiation/quality/duration/timing/severity/associated sxs/prior Treatment) HPI  The pt is a 55yo female with hx of migraines, presenting to [facility] with c/o 1 week of intermittent migraine headache that is slow in onset, worsens throughout the day. She reports going through menopause and has been spotting all week.  She use to only get migraines with her menstrual cycle so she believes current migraines are due to her menopause.  She has taken ibuprofen, acetaminophen, and Excedrin migraine with only temporary relief. She was feeling better last night but then this morning she woke with a migraine so she took her last Maxalt and took a nap.  Her headache resolved but then started to come back about 2 hours PTA.  She called her neurologist who recommend she go to emergency department as she cannot get her Maxalt refilled until June 11th due to insurance purposes.  Pt came to UC stating she knows it is not an "emergency" as it is her typical migraine, and last time she went to the ED, they "pumped me full of so many pain meds, when they wore off the HA came right back." HA is aching and sore, worse across front of her head, mild nausea and photophobia. C/w prior migraines. Denies numbness or weakness in arms or legs. Denise recent head injuries.   Past Medical History  Diagnosis Date  . PUD (peptic ulcer disease)    Past Surgical History  Procedure Laterality Date  . Cholecystectomy    . Cesarean section     No family history on file. Social History  Substance Use Topics  . Smoking status: Current Every Day Smoker -- 1.50 packs/day    Types: Cigarettes  . Smokeless tobacco: None  . Alcohol Use: Yes   OB History    No data available     Review of Systems  Constitutional: Negative for fever, chills  and fatigue.  Gastrointestinal: Positive for nausea. Negative for vomiting, abdominal pain and diarrhea.  Musculoskeletal: Negative for myalgias, back pain, neck pain and neck stiffness.  Skin: Negative for color change and wound.  Neurological: Positive for headaches. Negative for dizziness, syncope, weakness, light-headedness and numbness.    Allergies  Review of patient's allergies indicates no known allergies.  Home Medications   Prior to Admission medications   Medication Sig Start Date End Date Taking? Authorizing Provider  baclofen (LIORESAL) 10 MG tablet Take 1 tablet (10 mg total) by mouth 3 (three) times daily. 05/02/15   [name], PA-C  gabapentin (NEURONTIN) 100 MG capsule Take 2 capsules (200 mg total) by mouth 4 (four) times daily. Titrate to the above dosing with weekly increases 05/02/15   [name], PA-C  Iron-FA-B Cmp-C-Biot-Probiotic (FUSION PLUS) CAPS TAKE 1 CAPSULE BY MOUTH EVERY DAY 05/26/15   Hali Marry, MD  LINZESS 290 MCG CAPS capsule TAKE 1 CAPSULE (290 MCG TOTAL) BY MOUTH DAILY. 06/22/15   Hali Marry, MD  meloxicam (MOBIC) 15 MG tablet Take 1 tablet (15 mg total) by mouth daily as needed for pain. Take with food each morning 07/26/15   Hali Marry, MD  metoprolol succinate (TOPROL-XL) 25 MG 24 hr tablet TAKE 1/2 TABLET BY MOUTH EVERY DAY 06/16/14   Hali Marry, MD  omeprazole (PRILOSEC) 40 MG capsule  TAKE 1 CAPSULE (40 MG TOTAL) BY MOUTH DAILY. 04/03/15   Hali Marry, MD  rizatriptan (MAXALT) 10 MG tablet Take 1 tablet (10 mg total) by mouth as needed for migraine (may repeat after 2 hours.  max of 2 doses in 24 hours). 05/02/15   [name], PA-C  SUMAtriptan (IMITREX) 50 MG tablet Take 1 tablet (50 mg total) by mouth every 2 (two) hours as needed for migraine. May repeat in 2 hours if headache persists or recurs. 08/26/15   Noland Fordyce, PA-C  SUMAtriptan (IMITREX) 6 MG/0.5ML SOSY injection INJECT 0.5  MLS (6 MG TOTAL) INTO THE SKIN EVERY 2 (TWO) HOURS AS NEEDED FOR MIGRAINE OR HEADACHE. F 05/26/15   Hali Marry, MD  venlafaxine XR (EFFEXOR-XR) 75 MG 24 hr capsule Take 3 capsules (225 mg total) by mouth daily with breakfast. 07/26/15   Hali Marry, MD   Meds Ordered and Administered this Visit   Medications  SUMAtriptan (IMITREX) injection 6 mg (6 mg Subcutaneous Given 08/26/15 1844)    BP 123/82 mmHg  Pulse 84  Temp(Src) 98.2 F (36.8 C) (Oral)  Ht 5\' 4"  (1.626 m)  Wt 152 lb (68.947 kg)  BMI 26.08 kg/m2  SpO2 98% No data found.   Physical Exam  Constitutional: She is oriented to person, place, and time. She appears well-developed and well-nourished. No distress.  HENT:  Head: Normocephalic and atraumatic.  Right Ear: Tympanic membrane normal.  Left Ear: Tympanic membrane normal.  Nose: Nose normal.  Mouth/Throat: Uvula is midline, oropharynx is clear and moist and mucous membranes are normal.  Eyes: Conjunctivae are normal. No scleral icterus.  Neck: Normal range of motion. Neck supple.  No nuchal rigidity or meningeal signs.  Cardiovascular: Normal rate, regular rhythm and normal heart sounds.   Pulmonary/Chest: Effort normal and breath sounds normal. No respiratory distress. She has no wheezes. She has no rales.  Abdominal: Soft. She exhibits no distension. There is no tenderness.  Musculoskeletal: Normal range of motion.  Neurological: She is alert and oriented to person, place, and time. She has normal strength. No cranial nerve deficit or sensory deficit. She displays a negative Romberg sign. Coordination and gait normal. GCS eye subscore is 4. GCS verbal subscore is 5. GCS motor subscore is 6.  Skin: Skin is warm and dry. She is not diaphoretic.  Nursing note and vitals reviewed.   ED Course  Procedures (including critical care time)  Labs Review Labs Reviewed - No data to display  Imaging Review No results found.    MDM   1. Migraine without  aura and without status migrainosus, not intractable    Pt presenting to [facility] with c/o headache c/w prior migraines. Out of her Maxalt.  Discussed treatment options in UC including "migraine cocktail" of Toradol, Decadron, and Reglan or Imitrex. Pt wants to try Imitrex as she has had in the past and knows it works.  Imitrex- 6mg  SQ given, HA improved moderately while in UC.  Rx: Imitrex tablets (4)  Advised pt she may not be able to get these filled as same family as Maxalt, however, pt has already tried acetaminophen, ibuprofen, and excedrin and narcotic pain medications not indicated for migraines. Encouraged to f/u with her Neurologist or PCP for recurrent migraines as she believes they are linked to her going through menopausea. Discussed symptoms that warrant emergent care in the ED. Patient verbalized understanding and agreement with treatment plan.     Noland Fordyce, PA-C 08/27/15 1110

## 2015-08-26 NOTE — ED Notes (Signed)
Pt c/o migraine HA x 1 week, took last Maxalt this am, nausea, no vomitting, needs refill on meds, can't get refill till 09/03/2015

## 2015-08-26 NOTE — Discharge Instructions (Signed)
You were already given a dose of Imitrex at the urgent care this evening.  You may take 1 tab when you get home.  If you take this medication on another day, do no excede 2 tabs in a 24 hour period.     Migraine Headache A migraine headache is very bad, throbbing pain on one or both sides of your head. Talk to your doctor about what things may bring on (trigger) your migraine headaches. HOME CARE  Only take medicines as told by your doctor.  Lie down in a dark, quiet room when you have a migraine.  Keep a journal to find out if certain things bring on migraine headaches. For example, write down:  What you eat and drink.  How much sleep you get.  Any change to your diet or medicines.  Lessen how much alcohol you drink.  Quit smoking if you smoke.  Get enough sleep.  Lessen any stress in your life.  Keep lights dim if bright lights bother you or make your migraines worse. GET HELP RIGHT AWAY IF:   Your migraine becomes really bad.  You have a fever.  You have a stiff neck.  You have trouble seeing.  Your muscles are weak, or you lose muscle control.  You lose your balance or have trouble walking.  You feel like you will pass out (faint), or you pass out.  You have really bad symptoms that are different than your first symptoms. MAKE SURE YOU:   Understand these instructions.  Will watch your condition.  Will get help right away if you are not doing well or get worse.   This information is not intended to replace advice given to you by your health care provider. Make sure you discuss any questions you have with your health care provider.   Document Released: 12/19/2007 Document Revised: 06/03/2011 Document Reviewed: 11/16/2012 Elsevier Interactive Patient Education 2016 Else[employer]  Recurrent Migraine Headache A migraine headache is very bad, throbbing pain on one or both sides of your head. Recurrent migraines keep coming back. Talk to your doctor about  what things may bring on (trigger) your migraine headaches. HOME CARE  Only take medicines as told by your doctor.  Lie down in a dark, quiet room when you have a migraine.  Keep a journal to find out if certain things bring on migraine headaches. For example, write down:  What you eat and drink.  How much sleep you get.  Any change to your diet or medicines.  Lessen how much alcohol you drink.  Quit smoking if you smoke.  Get enough sleep.  Lessen any stress in your life.  Keep lights dim if bright lights bother you or make your migraines worse. GET HELP IF:  Medicine does not help your migraines.  Your pain keeps coming back.  You have a fever. GET HELP RIGHT AWAY IF:   Your migraine becomes really bad.  You have a stiff neck.  You have trouble seeing.  Your muscles are weak, or you lose muscle control.  You lose your balance or have trouble walking.  You feel like you will pass out (faint), or you pass out.  You have really bad symptoms that are different than your first symptoms. MAKE SURE YOU:   Understand these instructions.  Will watch your condition.  Will get help right away if you are not doing well or get worse.   This information is not intended to replace advice given to you by  your health care provider. Make sure you discuss any questions you have with your health care provider.   Document Released: 12/19/2007 Document Revised: 03/16/2013 Document Reviewed: 11/16/2012 Elsevier Interactive Patient Education [employer].

## 2015-08-30 ENCOUNTER — Other Ambulatory Visit: Payer: Self-pay | Admitting: Family Medicine

## 2015-09-18 ENCOUNTER — Encounter: Payer: Self-pay | Admitting: Physician Assistant

## 2015-09-18 ENCOUNTER — Ambulatory Visit (INDEPENDENT_AMBULATORY_CARE_PROVIDER_SITE_OTHER): Payer: BLUE CROSS/BLUE SHIELD | Admitting: Physician Assistant

## 2015-09-18 VITALS — BP 119/67 | HR 88 | Ht 64.0 in | Wt 158.0 lb

## 2015-09-18 DIAGNOSIS — D259 Leiomyoma of uterus, unspecified: Secondary | ICD-10-CM | POA: Insufficient documentation

## 2015-09-18 DIAGNOSIS — G43709 Chronic migraine without aura, not intractable, without status migrainosus: Secondary | ICD-10-CM

## 2015-09-18 MED ORDER — FROVATRIPTAN SUCCINATE 2.5 MG PO TABS: 2.5000 mg | ORAL_TABLET | ORAL | Status: DC | PRN

## 2015-09-18 MED ORDER — AMITRIPTYLINE HCL 25 MG PO TABS
ORAL_TABLET | ORAL | Status: DC
Start: 2015-09-18 — End: 2015-12-13

## 2015-09-18 MED ORDER — KETOROLAC TROMETHAMINE 60 MG/2ML IM SOLN
60.0000 mg | Freq: Once | INTRAMUSCULAR | Status: AC
  Administered 2015-09-18: 60 mg via INTRAMUSCULAR

## 2015-09-18 NOTE — Patient Instructions (Addendum)
frova to try as preventative. Add amitripyline to take at bedtime for furtther prevention.  Consider taking with baclofen for prevention.  Stay gabapentin.   Magnesium 800mg  daily for headaches.

## 2015-09-18 NOTE — Progress Notes (Signed)
   Subjective:    Patient ID: Anne Mcdonald, female    DOB: 10/06/1960, 55 y.o.   MRN: [id number]  HPI Pt is a 55 yo female who presents to the clinic with chronic migraines. She has seen neurologist but doctor recently changed practices and she needs to get new appt. She has a hx of migranes with menstrual cycle. Her menstral cycle has been all messed up. She saw GYN and has uterine fibroids. She is having complete hysterectomy in September. She is having her typical migraine every day. maxalt helps but insurance will only pay for 10 a month. Denies any auras. Has migraine today which is light sensitive and creates nausea.    Review of Systems  All other systems reviewed and are negative.      Objective:   Physical Exam  Constitutional: She is oriented to person, place, and time. She appears well-developed and well-nourished.  HENT:  Head: Normocephalic and atraumatic.  Cardiovascular: Normal rate, regular rhythm and normal heart sounds.   Neurological: She is alert and oriented to person, place, and time.  Psychiatric: She has a normal mood and affect. Her behavior is normal.          Assessment & Plan:   chronic migraine- toradol 60mg  IM given today. Added amitripyline 50mg  at bedtime added to gabapentin for prevention. Discussed baclofen at bedtime as well with magnesium 800mg  daily. Will try frova for rescue since used supply of maxalt at pharmacy. Follow up with neurologist to consider botox if not helping.   Uterine fibroid- hysterectomy scheduled for sept 2017.

## 2015-09-20 ENCOUNTER — Telehealth: Payer: Self-pay | Admitting: Physician Assistant

## 2015-09-20 MED ORDER — SUMATRIPTAN SUCCINATE 100 MG PO TABS: 100.0000 mg | ORAL_TABLET | ORAL | Status: DC | PRN

## 2015-09-20 NOTE — Telephone Encounter (Signed)
Ok to send imitrex 100mg  as needed. #10 1 refill.

## 2015-09-20 NOTE — Telephone Encounter (Signed)
Pt reports the new migraine Rx frovatriptan (FROVA) 2.5 MG tablet has caused the Pt to become "dizzy, light headed, and very drowsy." Pt states she has taken Imitrex in the past with no negative side effects. Will route to treating Provider to see if different Rx can be ordered. Pharmacy on file is correct.

## 2015-09-20 NOTE — Telephone Encounter (Signed)
Medication sent and patient is aware 

## 2015-10-02 ENCOUNTER — Other Ambulatory Visit: Payer: Self-pay | Admitting: *Deleted

## 2015-10-02 MED ORDER — MELOXICAM 15 MG PO TABS: 15.0000 mg | ORAL_TABLET | Freq: Every day | ORAL | Status: DC | PRN

## 2015-10-14 ENCOUNTER — Other Ambulatory Visit: Payer: Self-pay | Admitting: Physician Assistant

## 2015-10-14 DIAGNOSIS — G43709 Chronic migraine without aura, not intractable, without status migrainosus: Secondary | ICD-10-CM

## 2015-10-18 NOTE — Telephone Encounter (Signed)
Received refill request for Gabapentin from pharmacy.  Verified with pt that she is currently taking it three times daily, refill sent to pharmacy.  Scheduled follow-up for 12-22-15.

## 2015-10-18 NOTE — Telephone Encounter (Signed)
-----   Message from Francia Greaves sent at 10/18/2015 11:34 AM EDT ----- Regarding: Refill Request [facility] [phone]) Called and left a message on office voicemail, patient wants a refill on gabapentin

## 2015-10-21 ENCOUNTER — Other Ambulatory Visit: Payer: Self-pay | Admitting: Family Medicine

## 2015-10-23 NOTE — Telephone Encounter (Signed)
Is it ok to refill this medication? I see that she is on several migraine meds. This med not mentioned in most recent note her?

## 2015-10-30 ENCOUNTER — Other Ambulatory Visit: Payer: Self-pay | Admitting: Physician Assistant

## 2015-10-30 DIAGNOSIS — G43009 Migraine without aura, not intractable, without status migrainosus: Secondary | ICD-10-CM

## 2015-11-02 NOTE — Telephone Encounter (Signed)
Received refill request for Maxalt, pt last seen in February and recommended follow-up in 6 months.  Sent refill to pharmacy, called pt to schedule follow-up appt.

## 2015-11-17 ENCOUNTER — Other Ambulatory Visit: Payer: Self-pay | Admitting: Physician Assistant

## 2015-11-29 ENCOUNTER — Telehealth: Payer: Self-pay | Admitting: *Deleted

## 2015-11-29 NOTE — Telephone Encounter (Signed)
Called pt back and lvm advising that she call back.Audelia Hives [place]

## 2015-11-29 NOTE — Telephone Encounter (Signed)
Pt called and lvm asking for rtn call.Anne Mcdonald [place]

## 2015-12-13 ENCOUNTER — Other Ambulatory Visit: Payer: Self-pay | Admitting: Family Medicine

## 2015-12-13 ENCOUNTER — Other Ambulatory Visit: Payer: Self-pay | Admitting: Physician Assistant

## 2015-12-16 ENCOUNTER — Other Ambulatory Visit: Payer: Self-pay | Admitting: Family Medicine

## 2015-12-22 ENCOUNTER — Encounter: Payer: Self-pay | Admitting: Physician Assistant

## 2015-12-22 ENCOUNTER — Ambulatory Visit (INDEPENDENT_AMBULATORY_CARE_PROVIDER_SITE_OTHER): Payer: BLUE CROSS/BLUE SHIELD | Admitting: Physician Assistant

## 2015-12-22 VITALS — BP 122/82 | HR 72 | Ht 64.0 in | Wt 164.0 lb

## 2015-12-22 DIAGNOSIS — M62838 Other muscle spasm: Secondary | ICD-10-CM | POA: Diagnosis not present

## 2015-12-22 DIAGNOSIS — F17200 Nicotine dependence, unspecified, uncomplicated: Secondary | ICD-10-CM | POA: Diagnosis not present

## 2015-12-22 DIAGNOSIS — G43009 Migraine without aura, not intractable, without status migrainosus: Secondary | ICD-10-CM

## 2015-12-22 DIAGNOSIS — G43709 Chronic migraine without aura, not intractable, without status migrainosus: Secondary | ICD-10-CM

## 2015-12-22 MED ORDER — IMIPRAMINE HCL 10 MG PO TABS: 50.0000 mg | ORAL_TABLET | Freq: Every day | ORAL | 1 refills | Status: DC

## 2015-12-22 MED ORDER — SUMATRIPTAN SUCCINATE 100 MG PO TABS: 100.0000 mg | ORAL_TABLET | ORAL | 1 refills | Status: DC | PRN

## 2015-12-22 MED ORDER — RIZATRIPTAN BENZOATE 10 MG PO TABS: ORAL_TABLET | ORAL | 1 refills | Status: DC

## 2015-12-22 MED ORDER — GABAPENTIN 100 MG PO CAPS: 300.0000 mg | ORAL_CAPSULE | Freq: Two times a day (BID) | ORAL | 2 refills | Status: DC

## 2015-12-22 NOTE — Progress Notes (Signed)
History:  Anne Mcdonald is a 55 y.o. who presents to clinic today for evaluation of headache.  A few months ago she was started on amitriptyline 25mg  and that has been very helpful. She notices weight gain and dryness.   She is also on 300mg  Effexor daily provided by Dr. Charise Carwin.  Gabapentin she takes 2 in the morning and 2 at night.   She has been unable to remember it during the day.    HIT6:76 Number of days in the last 4 weeks with:  Severe headache: 13 Moderate headache: 0 Mild headache: 5   No headache: 10   Past Medical History:  Diagnosis Date  . Fibroid uterus   . PUD (peptic ulcer disease)     Social History   Social History  . Marital status: Married    Spouse name: N/A  . Number of children: N/A  . Years of education: N/A   Occupational History  . Not on file.   Social History Main Topics  . Smoking status: Current Every Day Smoker    Packs/day: 1.00    Types: Cigarettes  . Smokeless tobacco: Never Used  . Alcohol use Yes  . Drug use: No  . Sexual activity: Not Currently    Birth control/ protection: None   Other Topics Concern  . Not on file   Social History Narrative  . No narrative on file    History reviewed. No pertinent family history.  No Known Allergies  Current Outpatient Prescriptions on File Prior to Visit  Medication Sig Dispense Refill  . amitriptyline (ELAVIL) 25 MG tablet START ONE TABLET DAILY THEN INCREASE TO 2 TABLETS IN ONE WEEK IF NO IMPROVEMENT. 60 tablet 2  . gabapentin (NEURONTIN) 100 MG capsule Take 2 capsules (200 mg total) by mouth 3 (three) times daily. 180 capsule 2  . Iron-FA-B Cmp-C-Biot-Probiotic (FUSION PLUS) CAPS TAKE 1 CAPSULE BY MOUTH EVERY DAY 30 capsule 6  . LINZESS 290 MCG CAPS capsule TAKE 1 CAPSULE (290 MCG TOTAL) BY MOUTH DAILY. 30 capsule 5  . meloxicam (MOBIC) 15 MG tablet Take 1 tablet (15 mg total) by mouth daily as needed for pain. Take with food each morning 30 tablet 1  . metoprolol succinate  (TOPROL-XL) 25 MG 24 hr tablet TAKE 1/2 TABLET BY MOUTH EVERY DAY 90 tablet 2  . omeprazole (PRILOSEC) 40 MG capsule TAKE 1 CAPSULE (40 MG TOTAL) BY MOUTH DAILY. 90 capsule 1  . rizatriptan (MAXALT) 10 MG tablet TAKE 1 TABLET AD NEEDED FOR MIGRAINE-MAY REPEAT AFTER 2 HOURS-MAX DOSES OF 2 IN 24 HOURS 30 tablet 0  . SUMAtriptan (IMITREX) 100 MG tablet TAKE 1 TABLET EVERY 2 HOURS AS NEEDED FOR MIGRAINE-MAY REPEAT IN 2HRS IF HEADACHE PERSISTS OR RECURS 10 tablet 1  . venlafaxine XR (EFFEXOR-XR) 150 MG 24 hr capsule TAKE 1 CAPSULE (150 MG TOTAL) BY MOUTH DAILY WITH BREAKFAST. 90 capsule 0  . venlafaxine XR (EFFEXOR-XR) 75 MG 24 hr capsule Take 3 capsules (225 mg total) by mouth daily with breakfast. (Patient taking differently: Take 150 mg by mouth daily with breakfast. ) 270 capsule 3  . baclofen (LIORESAL) 10 MG tablet Take 1 tablet (10 mg total) by mouth 3 (three) times daily. (Patient not taking: Reported on 12/22/2015) 60 each 1  . SUMAtriptan (IMITREX) 6 MG/0.5ML SOSY injection INJECT 0.5 MLS (6 MG TOTAL) INTO THE SKIN EVERY 2 (TWO) HOURS AS NEEDED FOR MIGRAINE OR HEADACHE. F (Patient not taking: Reported on 12/22/2015) 3 Syringe 11  No current facility-administered medications on file prior to visit.      Review of Systems:  All pertinent positive/negative included in HPI, all other review of systems are negative  Objective:  Physical Exam BP 122/82 (BP Location: Right Arm, Patient Position: Sitting, Cuff Size: Normal)   Pulse 72   Ht 5\' 4"  (1.626 m)   Wt 164 lb (74.4 kg)   LMP 12/11/2015 Comment: Spotting for 2 days  BMI 28.15 kg/m  CONSTITUTIONAL: Well-developed, well-nourished female in no acute distress.  EYES: EOM intact ENT: Normocephalic CARDIOVASCULAR: Regular rate and rhythm with no adventitious sounds.  RESPIRATORY: Normal rate. Right lung with crackles at base  MUSCULOSKELETAL: Normal ROM, strength equal bilaterally, muscle spasm noted SKIN: Warm, dry without erythema   NEUROLOGICAL: Alert, oriented, CN II-XII grossly intact, Appropriate balance   PSYCH: Normal behavior, mood   Assessment & Plan:  Assessment: 1. Muscle spasm   2. Chronic migraine without aura without status migrainosus, not intractable   3. Migraine without aura and without status migrainosus, not intractable      Plan: Increase gabapentin to 3 caps bid Switch amitriptyline to Imipramine.  Begin with 10mg  nightly and increase weekly to max of 50mg .  Watch for sedation/weight change/dryness.  Continue other meds unchanged.   Do NOT MIX Maxalt and Imitrex Follow-up in 6 months or sooner PRN See PCP for breathing issues  [name], PA-C 12/22/2015 10:35 AM

## 2015-12-22 NOTE — Patient Instructions (Signed)

## 2016-01-03 ENCOUNTER — Other Ambulatory Visit: Payer: Self-pay | Admitting: Physician Assistant

## 2016-01-03 DIAGNOSIS — G43009 Migraine without aura, not intractable, without status migrainosus: Secondary | ICD-10-CM

## 2016-01-14 ENCOUNTER — Other Ambulatory Visit: Payer: Self-pay | Admitting: Physician Assistant

## 2016-01-27 ENCOUNTER — Other Ambulatory Visit: Payer: Self-pay | Admitting: Physician Assistant

## 2016-02-11 ENCOUNTER — Other Ambulatory Visit: Payer: Self-pay | Admitting: Physician Assistant

## 2016-02-11 DIAGNOSIS — G43009 Migraine without aura, not intractable, without status migrainosus: Secondary | ICD-10-CM

## 2016-02-24 ENCOUNTER — Other Ambulatory Visit: Payer: Self-pay | Admitting: Family Medicine

## 2016-03-05 ENCOUNTER — Other Ambulatory Visit: Payer: Self-pay | Admitting: Physician Assistant

## 2016-03-05 ENCOUNTER — Other Ambulatory Visit: Payer: Self-pay | Admitting: Family Medicine

## 2016-04-01 ENCOUNTER — Other Ambulatory Visit: Payer: Self-pay | Admitting: Physician Assistant

## 2016-04-01 ENCOUNTER — Other Ambulatory Visit: Payer: Self-pay | Admitting: Family Medicine

## 2016-04-01 DIAGNOSIS — G43709 Chronic migraine without aura, not intractable, without status migrainosus: Secondary | ICD-10-CM

## 2016-04-02 ENCOUNTER — Other Ambulatory Visit: Payer: Self-pay | Admitting: *Deleted

## 2016-04-12 ENCOUNTER — Other Ambulatory Visit: Payer: Self-pay | Admitting: Physician Assistant

## 2016-04-12 DIAGNOSIS — G43009 Migraine without aura, not intractable, without status migrainosus: Secondary | ICD-10-CM

## 2016-04-16 ENCOUNTER — Other Ambulatory Visit: Payer: Self-pay | Admitting: Family Medicine

## 2016-04-20 ENCOUNTER — Other Ambulatory Visit: Payer: Self-pay | Admitting: Family Medicine

## 2016-05-02 ENCOUNTER — Other Ambulatory Visit: Payer: Self-pay | Admitting: Physician Assistant

## 2016-05-02 DIAGNOSIS — G43009 Migraine without aura, not intractable, without status migrainosus: Secondary | ICD-10-CM

## 2016-05-07 DIAGNOSIS — F331 Major depressive disorder, recurrent, moderate: Secondary | ICD-10-CM | POA: Insufficient documentation

## 2016-05-15 ENCOUNTER — Telehealth: Payer: Self-pay | Admitting: Radiology

## 2016-05-15 NOTE — Telephone Encounter (Signed)
Left message on cell phone to call office back to schedule Headache f/u appointment with Enzo Bi (approx. 06/20/16)

## 2016-05-21 ENCOUNTER — Other Ambulatory Visit: Payer: Self-pay | Admitting: Physician Assistant

## 2016-05-21 DIAGNOSIS — G43009 Migraine without aura, not intractable, without status migrainosus: Secondary | ICD-10-CM

## 2016-05-27 ENCOUNTER — Other Ambulatory Visit: Payer: Self-pay | Admitting: Family Medicine

## 2016-05-28 ENCOUNTER — Other Ambulatory Visit: Payer: Self-pay | Admitting: Family Medicine

## 2016-05-29 ENCOUNTER — Telehealth: Payer: Self-pay | Admitting: *Deleted

## 2016-05-29 DIAGNOSIS — G43009 Migraine without aura, not intractable, without status migrainosus: Secondary | ICD-10-CM

## 2016-05-29 MED ORDER — RIZATRIPTAN BENZOATE 10 MG PO TABS: ORAL_TABLET | ORAL | 0 refills | Status: DC

## 2016-05-29 MED ORDER — BACLOFEN 10 MG PO TABS: 10.0000 mg | ORAL_TABLET | Freq: Three times a day (TID) | ORAL | 0 refills | Status: DC

## 2016-05-29 NOTE — Telephone Encounter (Signed)
Pt called the office requesting a refill on Imitrex and Maxalt, Santiago Glad approved refill for Maxalt and instructed pt to use that and Baclofen.  Informed pt of instructions, will schedule appt as soon as there is availability.

## 2016-05-31 ENCOUNTER — Ambulatory Visit (INDEPENDENT_AMBULATORY_CARE_PROVIDER_SITE_OTHER): Payer: BLUE CROSS/BLUE SHIELD | Admitting: Family Medicine

## 2016-05-31 VITALS — BP 119/73 | HR 92 | Ht 64.0 in | Wt 174.0 lb

## 2016-05-31 DIAGNOSIS — G43709 Chronic migraine without aura, not intractable, without status migrainosus: Secondary | ICD-10-CM

## 2016-05-31 DIAGNOSIS — I471 Supraventricular tachycardia: Secondary | ICD-10-CM

## 2016-05-31 DIAGNOSIS — F5101 Primary insomnia: Secondary | ICD-10-CM

## 2016-05-31 DIAGNOSIS — K581 Irritable bowel syndrome with constipation: Secondary | ICD-10-CM | POA: Diagnosis not present

## 2016-05-31 MED ORDER — LINACLOTIDE 290 MCG PO CAPS: ORAL_CAPSULE | ORAL | 5 refills | Status: DC

## 2016-05-31 NOTE — Patient Instructions (Signed)
Recommend [address] on [employer] for sleep.  Worth trying. Watch nightly for about 2 weeks.  Can try the smart phone app called My Fitness Pal to track calories and set a weight loss goal.

## 2016-05-31 NOTE — Progress Notes (Signed)
Subjective:    CC: Migraine  HPI: Patient reports that since I last saw her she was actually mated to behavioral [facility]. She almost committed suicide by overdosing on pills but decided to wait until her appointment the next day with her therapist. When she went in because she was having active suicidal thoughts they admitted her and kept her for about 9 days. The did change her medications around. And we have updated and corrected her current medication list based on his discharge medications. She has still not been sleeping well since she's been home and she still having a lot of racing thoughts. [name] been trying to get more involved in her church she's been seeing her pastor for counseling and she still going to the therapy sessions. She is likely going to divorce from her husband after 31 years. She is trying to figure out a strategy to do this as she is not currently working and employed. She says the only thing that kept her from taking her life was thinking about her children.  Migraine HA - fair control. Uses Maxalt as needed for rescue. They did discontinue her amitriptyline which she was using for prophylaxis.  Paroxysmal SVT- on metoprolol and doing well. [name] not had any recent or frequent symptoms.  IBS/ constipation - Doing well on her Linzess. [name] requesting a refill today.  Past medical history, Surgical history, Family history not pertinant except as noted below, Social history, Allergies, and medications have been entered into the medical record, reviewed, and corrections made.   Review of Systems: No fevers, chills, night sweats, weight loss, chest pain, or shortness of breath.   Objective:    General: Well Developed, well nourished, and in no acute distress.  Neuro: Alert and oriented x3, extra-ocular muscles intact, sensation grossly intact.  HEENT: Normocephalic, atraumatic  Skin: Warm and dry, no rashes. Cardiac: Regular rate and rhythm, no murmurs rubs or  gallops, no lower extremity edema.  Respiratory: Clear to auscultation bilaterally. Not using accessory muscles, speaking in full sentences.   Impression and Recommendations:   Severe depression-very involved in counseling and therapy area currently on Cariprazine. Continue current therapies. Next  Irritable bowel syndrome-refill Linzess.   Paroxysmal SVT-continue low-dose metoprolol. Refills sent today.  Insomnia-continue trazodone daily at bedtime. She'll occasionally take an extra Benadryl with that.   Migraine HA - currently off of prophylaxis. Continue when necessary rescue medication.

## 2016-06-06 ENCOUNTER — Telehealth: Payer: Self-pay | Admitting: *Deleted

## 2016-06-06 DIAGNOSIS — G43009 Migraine without aura, not intractable, without status migrainosus: Secondary | ICD-10-CM

## 2016-06-06 MED ORDER — SUMATRIPTAN SUCCINATE 100 MG PO TABS: ORAL_TABLET | ORAL | 0 refills | Status: DC

## 2016-06-06 NOTE — Telephone Encounter (Signed)
-----   Message from Blanchie Dessert, [place] sent at 06/05/2016  3:33 PM EDT ----- Regarding: pt call pt for refill Contact: [phone] Pt call patient, she is leaving for [place] tomorrow night and is wanting to get a refill for her headache medicine before she leaves,  [facility] [address], [place]  thanks

## 2016-06-06 NOTE — Telephone Encounter (Signed)
Sent Sumatriptan one time only per v/o karen teague clark.

## 2016-06-11 ENCOUNTER — Other Ambulatory Visit: Payer: Self-pay | Admitting: Family Medicine

## 2016-06-13 ENCOUNTER — Other Ambulatory Visit: Payer: Self-pay | Admitting: Physician Assistant

## 2016-06-13 DIAGNOSIS — G43009 Migraine without aura, not intractable, without status migrainosus: Secondary | ICD-10-CM

## 2016-06-19 ENCOUNTER — Other Ambulatory Visit: Payer: Self-pay | Admitting: Family Medicine

## 2016-06-19 DIAGNOSIS — G43009 Migraine without aura, not intractable, without status migrainosus: Secondary | ICD-10-CM

## 2016-07-06 ENCOUNTER — Other Ambulatory Visit: Payer: Self-pay | Admitting: Physician Assistant

## 2016-07-06 DIAGNOSIS — G43009 Migraine without aura, not intractable, without status migrainosus: Secondary | ICD-10-CM

## 2016-07-11 ENCOUNTER — Other Ambulatory Visit: Payer: Self-pay | Admitting: Physician Assistant

## 2016-07-11 DIAGNOSIS — G43009 Migraine without aura, not intractable, without status migrainosus: Secondary | ICD-10-CM

## 2016-07-27 ENCOUNTER — Other Ambulatory Visit: Payer: Self-pay | Admitting: Family Medicine

## 2016-07-30 ENCOUNTER — Other Ambulatory Visit: Payer: Self-pay | Admitting: Physician Assistant

## 2016-07-30 NOTE — Telephone Encounter (Signed)
Is pt still supposed to be taking this? It hasn't been written in several months.  Please advise.

## 2016-07-30 NOTE — Telephone Encounter (Signed)
Call patient and see if she is still on and we are still giving it to her.

## 2016-08-01 NOTE — Telephone Encounter (Signed)
Call pt please and confirm she is still taking medication. If she is we can give 1 month but she needs a follow up.

## 2016-08-02 ENCOUNTER — Ambulatory Visit (INDEPENDENT_AMBULATORY_CARE_PROVIDER_SITE_OTHER): Payer: BLUE CROSS/BLUE SHIELD | Admitting: Physician Assistant

## 2016-08-02 ENCOUNTER — Encounter: Payer: Self-pay | Admitting: Physician Assistant

## 2016-08-02 VITALS — BP 105/70 | HR 77 | Ht 64.0 in | Wt 170.8 lb

## 2016-08-02 DIAGNOSIS — F319 Bipolar disorder, unspecified: Secondary | ICD-10-CM | POA: Insufficient documentation

## 2016-08-02 DIAGNOSIS — G43831 Menstrual migraine, intractable, with status migrainosus: Secondary | ICD-10-CM | POA: Diagnosis not present

## 2016-08-02 DIAGNOSIS — G43009 Migraine without aura, not intractable, without status migrainosus: Secondary | ICD-10-CM | POA: Diagnosis not present

## 2016-08-02 DIAGNOSIS — G43709 Chronic migraine without aura, not intractable, without status migrainosus: Secondary | ICD-10-CM | POA: Diagnosis not present

## 2016-08-02 MED ORDER — RIZATRIPTAN BENZOATE 10 MG PO TABS: ORAL_TABLET | ORAL | 0 refills | Status: DC

## 2016-08-02 MED ORDER — BACLOFEN 10 MG PO TABS: 10.0000 mg | ORAL_TABLET | Freq: Three times a day (TID) | ORAL | 1 refills | Status: DC

## 2016-08-02 MED ORDER — GABAPENTIN 100 MG PO CAPS: 300.0000 mg | ORAL_CAPSULE | Freq: Three times a day (TID) | ORAL | 3 refills | Status: DC

## 2016-08-02 MED ORDER — SUMATRIPTAN SUCCINATE 100 MG PO TABS: ORAL_TABLET | ORAL | 0 refills | Status: DC

## 2016-08-02 NOTE — Progress Notes (Signed)
History:  Anne Mcdonald is a 56 y.o.  who presents to clinic today for followup migraine.  She had migraines with her period end of April but otherwise was better over the last month.  In March and early April, the headaches were worse.  In February, she had a very hard time and was admitted to behavioral health after suicidal ideation.  She was diagnosed with Bipolar Disorder at that time and her medications were changed.  She will be having a full hysterectomy along with a bladder sling in June.  HIT6:74 Number of days in the last 4 weeks with:  Severe headache: 6 Moderate headache: 5 Mild headache: 2  No headache: 15   Past Medical History:  Diagnosis Date  . Fibroid uterus   . PUD (peptic ulcer disease)     Social History   Social History  . Marital status: Married    Spouse name: N/A  . Number of children: N/A  . Years of education: N/A   Occupational History  . Not on file.   Social History Main Topics  . Smoking status: Current Every Day Smoker    Packs/day: 1.00    Types: Cigarettes  . Smokeless tobacco: Never Used  . Alcohol use No  . Drug use: No  . Sexual activity: Not Currently    Birth control/ protection: None   Other Topics Concern  . Not on file   Social History Narrative  . No narrative on file    History reviewed. No pertinent family history.  No Known Allergies  Current Outpatient Prescriptions on File Prior to Visit  Medication Sig Dispense Refill  . baclofen (LIORESAL) 10 MG tablet TAKE 1 TABLET (10 MG TOTAL) BY MOUTH 3 (THREE) TIMES DAILY. 60 tablet 0  . gabapentin (NEURONTIN) 100 MG capsule TAKE 3 CAPSULES (300 MG TOTAL) BY MOUTH 2 (TWO) TIMES DAILY. 180 capsule 2  . linaclotide (LINZESS) 290 MCG CAPS capsule TAKE 1 CAPSULE (290 MCG TOTAL) BY MOUTH DAILY. 30 capsule 5  . meloxicam (MOBIC) 7.5 MG tablet Take 7.5 mg by mouth 2 (two) times daily.  0  . metoprolol succinate (TOPROL-XL) 25 MG 24 hr tablet TAKE 1/2 TABLET BY MOUTH EVERY DAY  90 tablet 2  . rizatriptan (MAXALT) 10 MG tablet TAKE 1 TABLET AD NEEDED FOR MIGRAINE-MAY REPEAT AFTER 2 HOURS-MAX DOSES OF 2 IN 24 HOURS DO NOT MIX WITH IMITREX (Patient not taking: Reported on 08/02/2016) 30 tablet 0  . SUMAtriptan (IMITREX) 100 MG tablet Take 1 tab po prn migraine. May repeat in 2 hours if needed. Not to exceed 2 per 24h (Patient not taking: Reported on 08/02/2016) 9 tablet 0   No current facility-administered medications on file prior to visit.      Review of Systems:  All pertinent positive/negative included in HPI, all other review of systems are negative   Objective:  Physical Exam BP 105/70   Pulse 77   Ht 5\' 4"  (1.626 m)   Wt 170 lb 12.8 oz (77.5 kg)   LMP 07/17/2016   BMI 29.32 kg/m  CONSTITUTIONAL: Well-developed, well-nourished female in no acute distress.  EYES: EOM intact ENT: Normocephalic CARDIOVASCULAR: Regular rate   RESPIRATORY: Normal rate.  ENDOCRINE: Normal thyroid.  MUSCULOSKELETAL: Normal ROM SKIN: Warm, dry without erythema  NEUROLOGICAL: Alert, oriented, CN II-XII grossly intact, Appropriate balance PSYCH: appropriate mood and affect   Assessment & Plan:  Assessment: 1. Headache, menstrual migraine, intractable, with status migrainosus   2. Migraine without aura and without  status migrainosus, not intractable   3. Chronic migraine without aura without status migrainosus, not intractable      Plan: Patient has had multiple changes in medication regimen of late as a result of the bipolar diagnosis.  She may see some HA benefit from pharmaceutical therapy for this.   Additionally, pt scheduled to have full hysterectomy next month.   Will hold on making any adjustments to our current plan for HA prevention as she will likely continue to experiences change in her baseline over the coming weeks.   Pt assures me she does not and will not overuse triptans or mix triptans.  meds refilled as needed.  Follow-up in 3 months or sooner  PRN  [name], PA-C 08/02/2016 8:17 AM

## 2016-08-02 NOTE — Patient Instructions (Signed)

## 2016-08-23 HISTORY — PX: TOTAL ABDOMINAL HYSTERECTOMY W/ BILATERAL SALPINGOOPHORECTOMY: SHX83

## 2016-08-27 DIAGNOSIS — N814 Uterovaginal prolapse, unspecified: Secondary | ICD-10-CM | POA: Insufficient documentation

## 2016-09-09 ENCOUNTER — Other Ambulatory Visit: Payer: Self-pay | Admitting: Physician Assistant

## 2016-09-09 DIAGNOSIS — G43009 Migraine without aura, not intractable, without status migrainosus: Secondary | ICD-10-CM

## 2016-09-19 ENCOUNTER — Telehealth: Payer: Self-pay | Admitting: *Deleted

## 2016-09-19 DIAGNOSIS — G43009 Migraine without aura, not intractable, without status migrainosus: Secondary | ICD-10-CM

## 2016-09-19 MED ORDER — SUMATRIPTAN SUCCINATE 100 MG PO TABS: ORAL_TABLET | ORAL | 1 refills | Status: DC

## 2016-09-19 NOTE — Telephone Encounter (Signed)
Refill for Imitrex sent to the pharmacy per order, instructed pt to make sure that she does not mix the maxalt and imitrex in a 24 hr time period and scheduled her follow-up for 11-22-16 with Allie Dimmer.

## 2016-10-05 ENCOUNTER — Other Ambulatory Visit: Payer: Self-pay | Admitting: Physician Assistant

## 2016-10-17 ENCOUNTER — Other Ambulatory Visit: Payer: Self-pay | Admitting: Physician Assistant

## 2016-11-01 ENCOUNTER — Other Ambulatory Visit: Payer: Self-pay | Admitting: Physician Assistant

## 2016-11-15 ENCOUNTER — Other Ambulatory Visit: Payer: Self-pay | Admitting: Physician Assistant

## 2016-11-15 DIAGNOSIS — G43009 Migraine without aura, not intractable, without status migrainosus: Secondary | ICD-10-CM

## 2016-11-22 ENCOUNTER — Ambulatory Visit (INDEPENDENT_AMBULATORY_CARE_PROVIDER_SITE_OTHER): Payer: BLUE CROSS/BLUE SHIELD | Admitting: Physician Assistant

## 2016-11-22 ENCOUNTER — Encounter: Payer: Self-pay | Admitting: Physician Assistant

## 2016-11-22 VITALS — BP 102/63 | HR 78 | Ht 64.0 in | Wt 161.0 lb

## 2016-11-22 DIAGNOSIS — G4440 Drug-induced headache, not elsewhere classified, not intractable: Secondary | ICD-10-CM | POA: Diagnosis not present

## 2016-11-22 DIAGNOSIS — G43009 Migraine without aura, not intractable, without status migrainosus: Secondary | ICD-10-CM | POA: Diagnosis not present

## 2016-11-22 DIAGNOSIS — G43709 Chronic migraine without aura, not intractable, without status migrainosus: Secondary | ICD-10-CM | POA: Diagnosis not present

## 2016-11-22 MED ORDER — SUMATRIPTAN SUCCINATE 100 MG PO TABS: ORAL_TABLET | ORAL | 2 refills | Status: DC

## 2016-11-22 MED ORDER — BACLOFEN 10 MG PO TABS: 10.0000 mg | ORAL_TABLET | Freq: Three times a day (TID) | ORAL | 1 refills | Status: DC

## 2016-11-22 MED ORDER — METOCLOPRAMIDE HCL 10 MG PO TABS: 10.0000 mg | ORAL_TABLET | Freq: Three times a day (TID) | ORAL | 3 refills | Status: DC | PRN

## 2016-11-22 MED ORDER — RIZATRIPTAN BENZOATE 10 MG PO TABS: ORAL_TABLET | ORAL | 5 refills | Status: DC

## 2016-11-22 NOTE — Progress Notes (Signed)
History:  Anne Mcdonald is a 56 y.o. who presents to clinic today for migraine followup.  She had a hysterectomy in June that went well.  She has changed her bipolar medicine from Badin to Lamictal earlier this month.   Her headaches have been worse over the last couple of weeks.  She is unsure what has caused this, but she notes multiple medication and dose changes over the last couple months.  She states she has an ulcer that is acting up. She has not seen anyone for this. Her acute migraine medications are working well.  She notes no change in HA quality or character.    HIT6:72 Number of days in the last 4 weeks with:  Severe headache:4 Moderate headache: 10 Mild headache: 2  No headache: 12   Past Medical History:  Diagnosis Date  . Fibroid uterus   . PUD (peptic ulcer disease)     Social History   Social History  . Marital status: Married    Spouse name: N/A  . Number of children: N/A  . Years of education: N/A   Occupational History  . Not on file.   Social History Main Topics  . Smoking status: Current Every Day Smoker    Packs/day: 1.00    Types: Cigarettes  . Smokeless tobacco: Never Used  . Alcohol use No  . Drug use: No  . Sexual activity: Not Currently    Birth control/ protection: None, Surgical   Other Topics Concern  . Not on file   Social History Narrative  . No narrative on file    History reviewed. No pertinent family history.  No Known Allergies  Current Outpatient Prescriptions on File Prior to Visit  Medication Sig Dispense Refill  . gabapentin (NEURONTIN) 100 MG capsule Take 3 capsules (300 mg total) by mouth 3 (three) times daily. 180 capsule 3  . linaclotide (LINZESS) 290 MCG CAPS capsule TAKE 1 CAPSULE (290 MCG TOTAL) BY MOUTH DAILY. 30 capsule 5  . meloxicam (MOBIC) 7.5 MG tablet Take 7.5 mg by mouth 2 (two) times daily.  0  . metoprolol succinate (TOPROL-XL) 25 MG 24 hr tablet TAKE 1/2 TABLET BY MOUTH EVERY DAY 90 tablet 2   . rizatriptan (MAXALT) 10 MG tablet TAKE 1 TABLET AD NEEDED FOR MIGRAINE-MAY REPEAT AFTER 2 HOURS-MAX DOSES OF 2 IN 24 HOURS DO NOT MIX WITH IMITREX 30 tablet 0  . SUMAtriptan (IMITREX) 100 MG tablet Take 1 tab po prn migraine. May repeat in 2 hours if needed. Not to exceed 2 per 24h 9 tablet 1  . traZODone (DESYREL) 150 MG tablet Take 150 mg by mouth at bedtime.    Marland Kitchen VITAMIN E PO Take by mouth.    Marland Kitchen amitriptyline (ELAVIL) 25 MG tablet START ONE TABLET DAILY THEN INCREASE TO 2 TABLETS IN ONE WEEK IF NO IMPROVEMENT. (Patient not taking: Reported on 11/22/2016) 60 tablet 2  . baclofen (LIORESAL) 10 MG tablet Take 1 tablet (10 mg total) by mouth 3 (three) times daily. (Patient not taking: Reported on 11/22/2016) 60 tablet 1  . VRAYLAR 3 MG CAPS Take 1 capsule by mouth at bedtime.  0   No current facility-administered medications on file prior to visit.      Review of Systems:  All pertinent positive/negative included in HPI, all other review of systems are negative.  She denies rectal bleeding, black/tarry stools.     Objective:  Physical Exam BP 102/63   Pulse 78   Ht 5\' 4"  (  1.626 m)   Wt 161 lb (73 kg)   LMP 07/17/2016   BMI 27.64 kg/m  CONSTITUTIONAL: Well-developed, well-nourished female in no acute distress.  EYES: EOM intact ENT: Normocephalic CARDIOVASCULAR: Regular rate  RESPIRATORY: Normal rate.  MUSCULOSKELETAL: Normal ROM SKIN: Warm, dry without erythema  NEUROLOGICAL: Alert, oriented, CN II-XII grossly intact, Appropriate balance PSYCH: Normal behavior, mood, flat affect   Assessment & Plan:  Assessment: 1. Chronic migraine without aura without status migrainosus, not intractable   2. Migraine without aura and without status migrainosus, not intractable   3. Medication overuse headache      Plan: I hesitate to make much change as she needs to focus on getting the right mix for bipolar.  She is advised to continue working with psych.  ALso - urged to see her  PCP/possibly GI for eval and treatment of what she is calling an ulcer. Maxalt works best.  Imitrex helpful for when [employer] runs out. Pt knows not to combine these 2 within 24 hours.  She limits to 2 days/week.  Refills provided. Will add Reglan 10mg  up to TID.   Follow-up in 6 months or sooner PRN  [name], PA-C 11/22/2016 11:09 AM

## 2016-11-22 NOTE — Patient Instructions (Signed)

## 2016-11-26 ENCOUNTER — Ambulatory Visit (INDEPENDENT_AMBULATORY_CARE_PROVIDER_SITE_OTHER): Payer: BLUE CROSS/BLUE SHIELD | Admitting: Family Medicine

## 2016-11-26 ENCOUNTER — Encounter: Payer: Self-pay | Admitting: Family Medicine

## 2016-11-26 VITALS — BP 112/76 | HR 90 | Wt 159.0 lb

## 2016-11-26 DIAGNOSIS — R1012 Left upper quadrant pain: Secondary | ICD-10-CM | POA: Diagnosis not present

## 2016-11-26 LAB — CBC
HCT: 41.7 % (ref 35.0–45.0)
HEMOGLOBIN: 14.1 g/dL (ref 11.7–15.5)
MCH: 31.8 pg (ref 27.0–33.0)
MCHC: 33.8 g/dL (ref 32.0–36.0)
MCV: 93.9 fL (ref 80.0–100.0)
MPV: 8.4 fL (ref 7.5–12.5)
Platelets: 345 10*3/uL (ref 140–400)
RBC: 4.44 MIL/uL (ref 3.80–5.10)
RDW: 14.6 % (ref 11.0–15.0)
WBC: 9.3 10*3/uL (ref 3.8–10.8)

## 2016-11-26 MED ORDER — OMEPRAZOLE 40 MG PO CPDR: 40.0000 mg | DELAYED_RELEASE_CAPSULE | Freq: Two times a day (BID) | ORAL | 3 refills | Status: DC | PRN

## 2016-11-26 MED ORDER — SUCRALFATE 1 G PO TABS: 1.0000 g | ORAL_TABLET | Freq: Four times a day (QID) | ORAL | 0 refills | Status: DC

## 2016-11-26 NOTE — Progress Notes (Signed)
Anne Mcdonald is a 56 y.o. female who presents to [facility] today for upper left quadrant abdominal pain. Patient has a two-week history of intermittent moderate upper left quadrant abdominal pain. The pain is not associated with vomiting or diarrhea.  The pain is consistent with previous episodes of stomach ulcer. She continues to take omeprazole daily. She denies any new medications. No fevers or chills. She denies any blood in the stool. She denies any black tarry stool. She feels well otherwise.   Past Medical History:  Diagnosis Date  . Fibroid uterus   . PUD (peptic ulcer disease)    Past Surgical History:  Procedure Laterality Date  . CESAREAN SECTION    . CHOLECYSTECTOMY     Social History  Substance Use Topics  . Smoking status: Current Every Day Smoker    Packs/day: 1.00    Types: Cigarettes  . Smokeless tobacco: Never Used  . Alcohol use No   family history is not on file.  ROS as above:  Medications: Current Outpatient Prescriptions  Medication Sig Dispense Refill  . Ascorbic Acid (VITAMIN C) 1000 MG tablet Take by mouth.    . baclofen (LIORESAL) 10 MG tablet Take 1 tablet (10 mg total) by mouth 3 (three) times daily. 60 tablet 1  . CALCIUM-MAGNESIUM-ZINC PO Take by mouth.    . Cholecalciferol (VITAMIN D3) 5000 units CAPS Take by mouth.    . COD LIVER OIL PO Take by mouth.    . gabapentin (NEURONTIN) 100 MG capsule Take 3 capsules (300 mg total) by mouth 3 (three) times daily. 180 capsule 3  . lamoTRIgine (LAMICTAL) 100 MG tablet   0  . linaclotide (LINZESS) 290 MCG CAPS capsule TAKE 1 CAPSULE (290 MCG TOTAL) BY MOUTH DAILY. 30 capsule 5  . meloxicam (MOBIC) 7.5 MG tablet Take 7.5 mg by mouth 2 (two) times daily.  0  . metoCLOPramide (REGLAN) 10 MG tablet Take 1 tablet (10 mg total) by mouth 3 (three) times daily as needed for nausea. 30  tablet 3  . metoprolol succinate (TOPROL-XL) 25 MG 24 hr tablet TAKE 1/2 TABLET BY MOUTH EVERY DAY 90 tablet 2  . Multiple Vitamin (THERA) TABS Take by mouth.    . rizatriptan (MAXALT) 10 MG tablet TAKE 1 TABLET AD NEEDED FOR MIGRAINE-MAY REPEAT AFTER 2 HOURS-MAX DOSES OF 2 IN 24 HOURS DO NOT MIX WITH IMITREX 12 tablet 5  . SUMAtriptan (IMITREX) 100 MG tablet Take 1 tab po prn migraine. May repeat in 2 hours if needed. Not to exceed 2 per 24h 9 tablet 2  . traZODone (DESYREL) 150 MG tablet Take 150 mg by mouth at bedtime.    Marland Kitchen venlafaxine XR (EFFEXOR-XR) 75 MG 24 hr capsule   0  . VITAMIN E PO Take by mouth.    Marland Kitchen omeprazole (PRILOSEC) 40 MG capsule Take 1 capsule (40 mg total) by mouth 2 (two) times daily as needed. 60 capsule 3  . sucralfate (CARAFATE) 1 g tablet Take 1 tablet (1 g total) by mouth 4 (four) times daily. 120 tablet 0   No current facility-administered medications for this visit.    No Known Allergies  Health Maintenance Health Maintenance  Topic Date Due  . Hepatitis C Screening  July 15, 1960  . HIV Screening  11/05/1974  . INFLUENZA VACCINE  11/26/2017 (Originally 10/23/2016)  . MAMMOGRAM  04/11/2017  . PAP SMEAR  11/29/2017  . TETANUS/TDAP  03/05/2023  .  COLONOSCOPY  05/25/2024     Exam:  BP 112/76   Pulse 90   Wt 159 lb (72.1 kg)   LMP 07/17/2016   BMI 27.29 kg/m  Gen: Well NAD Nontoxic appearing HEENT: EOMI,  MMM Lungs: Normal work of breathing. CTABL Heart: RRR no MRG Abd: NABS, Soft. Nondistended, Minimally tender upper left quadrant no rebound or guarding. Exts: Brisk capillary refill, warm and well perfused.    No results found for this or any previous visit (from the past 72 hour(s)). No results found.    Assessment and Plan: 56 y.o. female with upper left quadrant abdominal pain consistent with previous episodes of stomach ulcer. Plan for limited workup listed below. Start empiric treatment with twice daily omeprazole and Carafate. Follow-up with  PCP in 2-4 weeks. Return Sooner if needed.   Orders Placed This Encounter  Procedures  . CBC  . COMPLETE METABOLIC PANEL WITH GFR  . Lipase   Meds ordered this encounter  Medications  . sucralfate (CARAFATE) 1 g tablet    Sig: Take 1 tablet (1 g total) by mouth 4 (four) times daily.    Dispense:  120 tablet    Refill:  0  . omeprazole (PRILOSEC) 40 MG capsule    Sig: Take 1 capsule (40 mg total) by mouth 2 (two) times daily as needed.    Dispense:  60 capsule    Refill:  3     Discussed warning signs or symptoms. Please see discharge instructions. Patient expresses understanding.

## 2016-11-26 NOTE — Patient Instructions (Signed)
Thank you for coming in today. Get labs today.  Increase omeprazole to 40mg  twice daily.  Take Carafate 1 pill 4x daily for 1 week.  Recheck with Dr Madilyn Fireman in 2-3 weeks.   If your belly pain worsens, or you have high fever, bad vomiting, blood in your stool or black tarry stool go to the Emergency Room.    Peptic Ulcer A peptic ulcer is a painful sore in the lining of your esophagus, stomach, or the first part of your small intestine. You may have pain in the area between your chest and your belly button. The most common causes of an ulcer are:  An infection.  Using certain pain medicines too often or too much.  Follow these instructions at home:  Avoid alcohol.  Avoid caffeine.  Do not use any tobacco products. These include cigarettes, chewing tobacco, and e-cigarettes. If you need help quitting, ask your doctor.  Take over-the-counter and prescription medicines only as told by your doctor. Do not stop or change your medicines unless you talk with your doctor about it first.  Keep all follow-up visits as told by your doctor. This is important. Contact a doctor if:  You do not get better in 7 days after you start treatment.  You keep having an upset stomach (indigestion) or heartburn. Get help right away if:  You have sudden, sharp pain in your belly (abdomen).  You have lasting belly pain.  You have bloody poop (stool) or black, tarry poop.  You throw up (vomit) blood. It may look like coffee grounds.  You feel light-headed or feel like you may pass out (faint).  You get weak.  You get sweaty or feel sticky and cold to the touch (clammy). This information is not intended to replace advice given to you by your health care provider. Make sure you discuss any questions you have with your health care provider. Document Released: 06/05/2009 Document Revised: 07/26/2015 Document Reviewed: 12/10/2014 Elsevier Interactive Patient Education  [employer].

## 2016-11-27 LAB — COMPLETE METABOLIC PANEL WITH GFR
ALBUMIN: 4.4 g/dL (ref 3.6–5.1)
ALK PHOS: 86 U/L (ref 33–130)
ALT: 16 U/L (ref 6–29)
AST: 16 U/L (ref 10–35)
BUN: 14 mg/dL (ref 7–25)
CALCIUM: 9.7 mg/dL (ref 8.6–10.4)
CHLORIDE: 109 mmol/L (ref 98–110)
CO2: 21 mmol/L (ref 20–32)
Creat: 0.8 mg/dL (ref 0.50–1.05)
GFR, EST NON AFRICAN AMERICAN: 82 mL/min (ref >=60)
GFR, Est African American: >89 mL/min (ref >=60)
GLUCOSE: 94 mg/dL (ref 65–99)
Potassium: 3.6 mmol/L (ref 3.5–5.3)
SODIUM: 142 mmol/L (ref 135–146)
Total Bilirubin: 0.4 mg/dL (ref 0.2–1.2)
Total Protein: 6.4 g/dL (ref 6.1–8.1)

## 2016-11-27 LAB — LIPASE: LIPASE: 35 U/L (ref 7–60)

## 2016-12-03 ENCOUNTER — Other Ambulatory Visit: Payer: Self-pay | Admitting: Physician Assistant

## 2016-12-03 DIAGNOSIS — G43709 Chronic migraine without aura, not intractable, without status migrainosus: Secondary | ICD-10-CM

## 2016-12-24 ENCOUNTER — Ambulatory Visit (INDEPENDENT_AMBULATORY_CARE_PROVIDER_SITE_OTHER): Payer: BLUE CROSS/BLUE SHIELD | Admitting: Family Medicine

## 2016-12-24 ENCOUNTER — Encounter: Payer: Self-pay | Admitting: Family Medicine

## 2016-12-24 VITALS — BP 125/76 | HR 78 | Ht 64.0 in | Wt 163.0 lb

## 2016-12-24 DIAGNOSIS — K259 Gastric ulcer, unspecified as acute or chronic, without hemorrhage or perforation: Secondary | ICD-10-CM | POA: Diagnosis not present

## 2016-12-24 DIAGNOSIS — G43709 Chronic migraine without aura, not intractable, without status migrainosus: Secondary | ICD-10-CM | POA: Diagnosis not present

## 2016-12-24 DIAGNOSIS — Z720 Tobacco use: Secondary | ICD-10-CM

## 2016-12-24 DIAGNOSIS — L301 Dyshidrosis [pompholyx]: Secondary | ICD-10-CM | POA: Diagnosis not present

## 2016-12-24 MED ORDER — CLOBETASOL PROPIONATE 0.05 % EX OINT: TOPICAL_OINTMENT | CUTANEOUS | 0 refills | Status: DC

## 2016-12-24 NOTE — Progress Notes (Signed)
Subjective:    CC: F/U ulcer  HPI:   F/U peptic ulcer.  She says she has a history of an ulcer. She said it started to cause some epigastric pain and discomfort actually came in and saw one of my colleagues, Dr. Georgina Snell on September 4 proximally 4 weeks ago. He increased her PPI to twice a day and actually put her on Carafate as well. She said it took about 2 weeks but her symptoms completely resolved until this past Friday. [name] no longer on the Carafate but is still taking the omeprazole twice a day and is still having some breakthrough breakthrough symptoms. She had epigastric pain that lasted most of the day on Friday, then felt a little better on Saturday and then again on Sunday. She denies any significant constipation area no vomiting. [name] had a history of cholecystectomy. She is a smoker. She denies any significant changes to her diet recently  She did want to mention that her migraines have gotten significantly worse over the last month. [name] noticing it's happening more with exertion. Until that point in time she was doing a little better. In fact right after her hysterectomy she had really good control of her migraines for about 6 weeks but they've slowly started to come back. Now they're occurring daily. She is Artie on gabapentin and Lamictal. She uses Maxalt for rescue. [name] not sure what else to do to help improve her headaches. She says she stays really well hydrated. [name] not had any major dietary changes. She does not normal extrinsic any seasonal allergies.  Dyshidrotic eczema-she would like a refill on some clobetasol ointment. She is using the past and that works really well. She starting to get the thick dry peeling skin on the palm of her right hand again  Past medical history, Surgical history, Family history not pertinant except as noted below, Social history, Allergies, and medications have been entered into the medical record, reviewed, and corrections made.   Review of  Systems: No fevers, chills, night sweats, weight loss, chest pain, or shortness of breath.   Objective:    General: Well Developed, well nourished, and in no acute distress.  Neuro: Alert and oriented x3, extra-ocular muscles intact, sensation grossly intact.  HEENT: Normocephalic, atraumatic, Oropharynx is clear, TMs and canals are clear bilaterally. No significant cervical lymphadenopathy.  Skin: Warm and dry, no rashes.She is getting the thick peeling skin on the palm of the right hand. Cardiac: Regular rate and rhythm, no murmurs rubs or gallops, no lower extremity edema.  Respiratory: Clear to auscultation bilaterally. Not using accessory muscles, speaking in full sentences. Abd: soft, with some mild tenderness in the epigastric area. No rebound or guarding.   Impression and Recommendations:    Migraine - Uncontrolled currently. The only changes that have been made is that her PPI was increased to twice a day about 4 weeks ago. Since she still having some breakthrough symptoms at this point than okay to decrease the PPI back down to once a day and see if the headaches actually improved. Otherwise I don't have a specific reason for why her migraines would be worse. In which case I recommend that she get in contact with her neurologist.  Dyshidrotic eczema-will refill clobetasol ointment.  Peptic ulcer - at this point she's having persistent breakthrough symptoms even while being on a PPI twice a day. Refer to GI for further evaluation and possible endoscopy. Patient prefers to stay in [place] so we'll refer to digestive health.  Encouraged her to really watch her diet and recommend quitting smoking.  Tobacco abuse-encourage smoking cessation.

## 2016-12-25 ENCOUNTER — Other Ambulatory Visit: Payer: Self-pay | Admitting: Family Medicine

## 2016-12-25 ENCOUNTER — Other Ambulatory Visit: Payer: Self-pay | Admitting: Physician Assistant

## 2016-12-25 DIAGNOSIS — G43009 Migraine without aura, not intractable, without status migrainosus: Secondary | ICD-10-CM

## 2017-01-04 ENCOUNTER — Other Ambulatory Visit: Payer: Self-pay | Admitting: Family Medicine

## 2017-02-02 ENCOUNTER — Other Ambulatory Visit: Payer: Self-pay | Admitting: Family Medicine

## 2017-02-06 ENCOUNTER — Other Ambulatory Visit: Payer: Self-pay | Admitting: Family Medicine

## 2017-02-17 ENCOUNTER — Other Ambulatory Visit: Payer: Self-pay | Admitting: Physician Assistant

## 2017-02-17 DIAGNOSIS — G43009 Migraine without aura, not intractable, without status migrainosus: Secondary | ICD-10-CM

## 2017-02-28 ENCOUNTER — Other Ambulatory Visit: Payer: Self-pay | Admitting: Physician Assistant

## 2017-02-28 DIAGNOSIS — G43009 Migraine without aura, not intractable, without status migrainosus: Secondary | ICD-10-CM

## 2017-03-09 ENCOUNTER — Other Ambulatory Visit: Payer: Self-pay | Admitting: Family Medicine

## 2017-03-12 ENCOUNTER — Ambulatory Visit (INDEPENDENT_AMBULATORY_CARE_PROVIDER_SITE_OTHER): Payer: BLUE CROSS/BLUE SHIELD | Admitting: Family Medicine

## 2017-03-12 ENCOUNTER — Ambulatory Visit: Payer: BLUE CROSS/BLUE SHIELD | Admitting: Family Medicine

## 2017-03-12 ENCOUNTER — Ambulatory Visit (INDEPENDENT_AMBULATORY_CARE_PROVIDER_SITE_OTHER): Payer: BLUE CROSS/BLUE SHIELD

## 2017-03-12 ENCOUNTER — Encounter: Payer: Self-pay | Admitting: Family Medicine

## 2017-03-12 VITALS — BP 118/78 | HR 72 | Ht 64.0 in | Wt 150.0 lb

## 2017-03-12 DIAGNOSIS — R102 Pelvic and perineal pain: Secondary | ICD-10-CM

## 2017-03-12 DIAGNOSIS — G5601 Carpal tunnel syndrome, right upper limb: Secondary | ICD-10-CM

## 2017-03-12 DIAGNOSIS — R251 Tremor, unspecified: Secondary | ICD-10-CM | POA: Diagnosis not present

## 2017-03-12 DIAGNOSIS — M1810 Unilateral primary osteoarthritis of first carpometacarpal joint, unspecified hand: Secondary | ICD-10-CM | POA: Diagnosis not present

## 2017-03-12 NOTE — Patient Instructions (Addendum)
Can try Tylenol arthritis, 1 3 times a day for your joint pain. If this is not helping you can also try glucosamine/chondroitin or tumeric supplement.   The splint at night.  And if you are not getting better over the next 3-4 weeks and try to schedule back with 1 of our sports medicine providers.  Carpal Tunnel Syndrome Carpal tunnel syndrome is a condition that causes pain in your hand and arm. The carpal tunnel is a narrow area that is on the palm side of your wrist. Repeated wrist motion or certain diseases may cause swelling in the tunnel. This swelling can pinch the main nerve in the wrist (median nerve). Follow these instructions at home: If you have a splint:  Wear it as told by your doctor. Remove it only as told by your doctor.  Loosen the splint if your fingers: ? Become numb and tingle. ? Turn blue and cold.  Keep the splint clean and dry. General instructions  Take over-the-counter and prescription medicines only as told by your doctor.  Rest your wrist from any activity that may be causing your pain. If needed, talk to your employer about changes that can be made in your work, such as getting a wrist pad to use while typing.  If directed, apply ice to the painful area: ? Put ice in a plastic bag. ? Place a towel between your skin and the bag. ? Leave the ice on for 20 minutes, 2-3 times per day.  Keep all follow-up visits as told by your doctor. This is important.  Do any exercises as told by your doctor, physical therapist, or occupational therapist. Contact a doctor if:  You have new symptoms.  Medicine does not help your pain.  Your symptoms get worse. This information is not intended to replace advice given to you by your health care provider. Make sure you discuss any questions you have with your health care provider. Document Released: 02/28/2011 Document Revised: 08/17/2015 Document Reviewed: 07/27/2014 Elsevier Interactive Patient Education  [employer].

## 2017-03-12 NOTE — Progress Notes (Signed)
Subjective:    Patient ID: Anne Mcdonald, female    DOB: Dec 11, 1960, 56 y.o.   MRN: [id number]  HPI She c/o of right hand going numb at time. Has been going on for one year. But worse the lst 2 months.  Says it is mostly her index, middle and 4th finger.  Says can tingle, but comes and comes. Can radiate to her elbow at times.  She has also noticed a tremor in her right hand for about a year as well exp with fine movements like puttin on her makeup. Wonders if could her medication.    Reports that she gets intermittent suprapubic pain.  She says mostly it happens when she is going to pass gas or have a bowel movement.  She says when she passes the gas or has a bowel movement and she gets complete relief.  It crosses over from the right lower quadrant to the left lower quadrant.  She had a hysterectomy about 6 months ago.  She has not had any blood in her stool.  Denies any constipation or excess gas production.  Review of Systems  BP 118/78   Pulse 72   Ht '5\' 4"'$  (1.626 m)   Wt 150 lb (68 kg)   LMP 07/17/2016   SpO2 100%   BMI 25.75 kg/m     No Known Allergies  Past Medical History:  Diagnosis Date  . Fibroid uterus   . PUD (peptic ulcer disease)     Past Surgical History:  Procedure Laterality Date  . CESAREAN SECTION    . CHOLECYSTECTOMY      Social History   Socioeconomic History  . Marital status: Married    Spouse name: Not on file  . Number of children: Not on file  . Years of education: Not on file  . Highest education level: Not on file  Social Needs  . Financial resource strain: Not on file  . Food insecurity - worry: Not on file  . Food insecurity - inability: Not on file  . Transportation needs - medical: Not on file  . Transportation needs - non-medical: Not on file  Occupational History  . Not on file  Tobacco Use  . Smoking status: Current Every Day Smoker    Packs/day: 1.00    Types: Cigarettes  . Smokeless tobacco: Never Used  Substance and  Sexual Activity  . Alcohol use: No  . Drug use: No  . Sexual activity: Not Currently    Birth control/protection: None, Surgical  Other Topics Concern  . Not on file  Social History Narrative  . Not on file    No family history on file.  Outpatient Encounter Medications as of 03/12/2017  Medication Sig  . Ascorbic Acid (VITAMIN C) 1000 MG tablet Take by mouth.  . baclofen (LIORESAL) 10 MG tablet TAKE 1 TABLET BY MOUTH THREE TIMES A DAY  . CALCIUM-MAGNESIUM-ZINC PO Take by mouth.  . Cholecalciferol (VITAMIN D3) 5000 units CAPS Take by mouth.  . clobetasol ointment (TEMOVATE) 0.05 % Apply to palms of hands QD prn.  . COD LIVER OIL PO Take by mouth.  . gabapentin (NEURONTIN) 100 MG capsule TAKE 3 CAPSULES BY MOUTH TWICE A DAY  . lamoTRIgine (LAMICTAL) 100 MG tablet   . LINZESS 290 MCG CAPS capsule TAKE 1 CAPSULE (290 MCG TOTAL) BY MOUTH DAILY.  Marland Kitchen metoCLOPramide (REGLAN) 10 MG tablet Take 1 tablet (10 mg total) by mouth 3 (three) times daily as needed for nausea.  . metoprolol  succinate (TOPROL-XL) 25 MG 24 hr tablet TAKE 1/2 TABLET BY MOUTH EVERY DAY  . Multiple Vitamin (THERA) TABS Take by mouth.  . sucralfate (CARAFATE) 1 g tablet TAKE 1 TABLET (1 G TOTAL) BY MOUTH 4 (FOUR) TIMES DAILY.  . SUMAtriptan (IMITREX) 100 MG tablet TAKE 1 TABLET AS NEEDED FOR MIGRAINE. REPEAT IN 2 HOURS IF NEEDED. NOT TO EXCEED 2 TABS IN 24HOURS  . topiramate (TOPAMAX) 50 MG tablet 50 mg.  . traZODone (DESYREL) 150 MG tablet Take 150 mg by mouth at bedtime.  Marland Kitchen venlafaxine XR (EFFEXOR-XR) 75 MG 24 hr capsule Take 225 mg by mouth.  Marland Kitchen VITAMIN E PO Take by mouth.  . [DISCONTINUED] meloxicam (MOBIC) 7.5 MG tablet Take 7.5 mg by mouth 2 (two) times daily.  . [DISCONTINUED] omeprazole (PRILOSEC) 40 MG capsule TAKE 1 CAPSULE (40 MG TOTAL) BY MOUTH DAILY.  . [DISCONTINUED] rizatriptan (MAXALT) 10 MG tablet TAKE 1 TABLET AD NEEDED FOR MIGRAINE-MAY REPEAT AFTER 2 HOURS-MAX DOSES OF 2 IN 24 HOURS DO NOT MIX WITH  IMITREX  . [DISCONTINUED] SUMAtriptan (IMITREX) 100 MG tablet Take 1 tab po prn migraine. May repeat in 2 hours if needed. Not to exceed 2 per 24h  . [DISCONTINUED] topiramate (TOPAMAX) 25 MG tablet Take 25 mg by mouth at bedtime.  . [DISCONTINUED] venlafaxine XR (EFFEXOR-XR) 75 MG 24 hr capsule    No facility-administered encounter medications on file as of 03/12/2017.          Objective:   Physical Exam  Constitutional: She is oriented to person, place, and time. She appears well-developed and well-nourished.  HENT:  Head: Normocephalic and atraumatic.  Eyes: Conjunctivae and EOM are normal.  Cardiovascular: Normal rate.  Pulmonary/Chest: Effort normal.  Musculoskeletal:  Positive Tinel's sign and positive Phalen sign with the right wrist.  Nontender over the wrist joint itself.  She is tender of the Webster joint at the base of the thumb.  No edema or swelling of any of the finger joints.  Normal range of motion.  Is just slightly tender over the lateral epicondyle on the right elbow.  Neurological: She is alert and oriented to person, place, and time.  Skin: Skin is dry. No pallor.  Psychiatric: She has a normal mood and affect. Her behavior is normal.  Vitals reviewed.      Assessment & Plan:  Carpal tunnel right hand -Splint provided.  Additional information and handout provided.  She is to wear the splint at night.  If not improving over the next 3-4 weeks and encouraged her to get in with 1 of our sports medicine providers.  Can use Tylenol arthritis for pain relief.  Right hand tremor - could be secondary to her gabapentin.  Report shows approximately 7% of patients to experience mild tremor with gabapentin.  Right now she feels that the benefits of the gabapentin outweigh the tremors so we will just keep an eye on it.  Suprapubic pain  -KUB and call her with results.  Could consider a trial of a probiotic.  She denies any recent dietary changes or straining with bowel  movement.  Arthritis of bilateral thumbs-recommended a trial of Tylenol arthritis.

## 2017-03-26 ENCOUNTER — Encounter: Payer: Self-pay | Admitting: Radiology

## 2017-04-03 ENCOUNTER — Other Ambulatory Visit: Payer: Self-pay | Admitting: Family Medicine

## 2017-04-11 ENCOUNTER — Other Ambulatory Visit: Payer: Self-pay | Admitting: Family Medicine

## 2017-04-14 ENCOUNTER — Telehealth: Payer: Self-pay | Admitting: *Deleted

## 2017-04-14 NOTE — Telephone Encounter (Signed)
Pt called stating that she has had a headache everday since November, states that she wanted to see [facility], [facility] doesn't have an appointment until 06/06/17, which she has been scheduled. Patient wants to know what she can do in the meantime until March.  I have left pt a message to rtn call.

## 2017-04-21 ENCOUNTER — Ambulatory Visit: Payer: BLUE CROSS/BLUE SHIELD | Admitting: Family Medicine

## 2017-04-21 ENCOUNTER — Ambulatory Visit: Payer: BLUE CROSS/BLUE SHIELD | Admitting: Physician Assistant

## 2017-04-21 ENCOUNTER — Telehealth: Payer: Self-pay | Admitting: Family Medicine

## 2017-04-21 VITALS — BP 106/75 | HR 76 | Ht 64.02 in | Wt 150.0 lb

## 2017-04-21 DIAGNOSIS — Z13220 Encounter for screening for lipoid disorders: Secondary | ICD-10-CM | POA: Diagnosis not present

## 2017-04-21 DIAGNOSIS — R5383 Other fatigue: Secondary | ICD-10-CM | POA: Diagnosis not present

## 2017-04-21 DIAGNOSIS — G43831 Menstrual migraine, intractable, with status migrainosus: Secondary | ICD-10-CM | POA: Diagnosis not present

## 2017-04-21 DIAGNOSIS — K2100 Gastro-esophageal reflux disease with esophagitis, without bleeding: Secondary | ICD-10-CM

## 2017-04-21 DIAGNOSIS — G43709 Chronic migraine without aura, not intractable, without status migrainosus: Secondary | ICD-10-CM

## 2017-04-21 DIAGNOSIS — K5901 Slow transit constipation: Secondary | ICD-10-CM | POA: Diagnosis not present

## 2017-04-21 MED ORDER — LINACLOTIDE 290 MCG PO CAPS: ORAL_CAPSULE | ORAL | 4 refills | Status: DC

## 2017-04-21 NOTE — Telephone Encounter (Signed)
Anne Mcdonald, can you clarify with her. Not sure if need to see sports med.  Not sure what joint she is wanting

## 2017-04-21 NOTE — Patient Instructions (Addendum)
Emgality/aimovig consider headache doctor.     Fatigue Fatigue is feeling tired all of the time, a lack of energy, or a lack of motivation. Occasional or mild fatigue is often a normal response to activity or life in general. However, long-lasting (chronic) or extreme fatigue may indicate an underlying medical condition. Follow these instructions at home: Watch your fatigue for any changes. The following actions may help to lessen any discomfort you are feeling:  Talk to your health care provider about how much sleep you need each night. Try to get the required amount every night.  Take medicines only as directed by your health care provider.  Eat a healthy and nutritious diet. Ask your health care provider if you need help changing your diet.  Drink enough fluid to keep your urine clear or pale yellow.  Practice ways of relaxing, such as yoga, meditation, massage therapy, or acupuncture.  Exercise regularly.  Change situations that cause you stress. Try to keep your work and personal routine reasonable.  Do not abuse illegal drugs.  Limit alcohol intake to no more than 1 drink per day for nonpregnant women and 2 drinks per day for men. One drink equals 12 ounces of beer, 5 ounces of wine, or 1 ounces of hard liquor.  Take a multivitamin, if directed by your health care provider.  Contact a health care provider if:  Your fatigue does not get better.  You have a fever.  You have unintentional weight loss or gain.  You have headaches.  You have difficulty: ? Falling asleep. ? Sleeping throughout the night.  You feel angry, guilty, anxious, or sad.  You are unable to have a bowel movement (constipation).  You skin is dry.  Your legs or another part of your body is swollen. Get help right away if:  You feel confused.  Your vision is blurry.  You feel faint or pass out.  You have a severe headache.  You have severe abdominal, pelvic, or back pain.  You have  chest pain, shortness of breath, or an irregular or fast heartbeat.  You are unable to urinate or you urinate less than normal.  You develop abnormal bleeding, such as bleeding from the rectum, vagina, nose, lungs, or nipples.  You vomit blood.  You have thoughts about harming yourself or committing suicide.  You are worried that you might harm someone else. This information is not intended to replace advice given to you by your health care provider. Make sure you discuss any questions you have with your health care provider. Document Released: 01/06/2007 Document Revised: 08/17/2015 Document Reviewed: 07/13/2013 Elsevier Interactive Patient Education  [employer].

## 2017-04-21 NOTE — Progress Notes (Signed)
Subjective:    Patient ID: Anne Mcdonald, female    DOB: [birth year], 57 y.o.   MRN: [id number]  HPI Pt is a 57 yo female with migraines, SVT, and MDDbipolar who presents to the clinic for evaluation on fatigue.   She comes in because she is so tired. All she wants to do is sleep. She has no motivation. She admits she is depressed. She see psych for this. She has appt on Wednesday. She has had no medication changes. She does have hx of ulcer. No abdominal pain today. She does admit she is having more headaches recently and taking exedrin migraine almost every day. Denies any melena or hematochezia. Pt has headache specialist.   She is also having problems with constipation. Tried stool softeners, miralax, ducolax with no relief.   .. Active Ambulatory Problems    Diagnosis Date Noted  . HYPERTRIGLYCERIDEMIA 07/19/2008  . CIGARETTE SMOKER 08/24/2009  . Migraine without aura 09/07/2008  . PREMENSTRUAL DYSPHORIC SYNDROME 04/24/2006  . HOT FLASHES 04/24/2006  . BACK PAIN 04/24/2006  . FATIGUE 04/24/2006  . Paroxysmal SVT (supraventricular tachycardia) ([place]) 06/10/2012  . GERD (gastroesophageal reflux disease) 11/29/2014  . Headache, menstrual migraine, intractable, with status migrainosus 12/09/2014  . Chronic migraine without aura without status migrainosus, not intractable 05/02/2015  . Muscle spasm 05/02/2015  . Medication overuse headache 05/02/2015  . Uterine fibroid 09/18/2015  . Moderate episode of recurrent major depressive disorder ([place]) 05/07/2016  . Bipolar 1 disorder ([place]) 08/02/2016  . Dyshidrotic eczema 12/24/2016   Resolved Ambulatory Problems    Diagnosis Date Noted  . Menstrual migraine 08/24/2009  . DISORDER, COCCYX NOS 07/01/2006  . Palpitations 07/19/2008  . Wheezing 08/24/2009   Past Medical History:  Diagnosis Date  . Fibroid uterus   . PUD (peptic ulcer disease)      Review of Systems  All other systems reviewed and are negative.       Objective:   Physical Exam  Constitutional: She is oriented to person, place, and time. She appears well-developed and well-nourished.  HENT:  Head: Normocephalic and atraumatic.  Neck: Normal range of motion. Neck supple. No thyromegaly present.  Cardiovascular: Normal rate, regular rhythm and normal heart sounds.  Pulmonary/Chest: Effort normal and breath sounds normal.  Lymphadenopathy:    She has no cervical adenopathy.  Neurological: She is alert and oriented to person, place, and time.  Psychiatric: Her behavior is normal.  Flat affect.           Assessment & Plan:  Marland KitchenMarland KitchenMira was seen today for lack of energy and stays cold.  Diagnoses and all orders for this visit:  No energy -     CBC -     Comprehensive metabolic panel -     C-reactive protein -     Ferritin -     Sedimentation rate -     TSH -     Vitamin B12 -     Vit D  25 hydroxy (rtn osteoporosis monitoring)  Screening for lipid disorders -     Lipid Panel w/reflex Direct LDL  Chronic migraine without aura without status migrainosus, not intractable  Headache, menstrual migraine, intractable, with status migrainosus  Gastroesophageal reflux disease with esophagitis  Slow transit constipation -     linaclotide (LINZESS) 290 MCG CAPS capsule; TAKE 1 CAPSULE (290 MCG TOTAL) BY MOUTH DAILY.   Will evaluate metabolic causes of fatigue.   fatgiue still could be from mood disorder. Will have f/u this week.  For migraines consider emgality to discuss at next visit. Watch NSAID use due to ulcer hx. Will check hgb.   linzess to start for constipation.   Marland Kitchen.Spent 30 minutes with patient and greater than 50 percent of visit spent counseling patient regarding treatment plan.

## 2017-04-21 NOTE — Telephone Encounter (Signed)
Both of her thumbs.Maryruth Eve, [place] Crocker, CMA

## 2017-04-22 LAB — LIPID PANEL W/REFLEX DIRECT LDL
Cholesterol: 226 mg/dL — ABNORMAL HIGH (ref <200)
HDL: 67 mg/dL (ref >50)
LDL Cholesterol (Calc): 133 mg/dL (calc) — ABNORMAL HIGH
NON-HDL CHOLESTEROL (CALC): 159 mg/dL — AB (ref <130)
TRIGLYCERIDES: 143 mg/dL (ref <150)
Total CHOL/HDL Ratio: 3.4 (calc) (ref <5.0)

## 2017-04-22 NOTE — Telephone Encounter (Signed)
OK to schedule with sports med then.

## 2017-04-22 NOTE — Telephone Encounter (Signed)
Left a message advising patient to call back to schedule an appointment.  °

## 2017-04-23 LAB — CBC
HCT: 39 % (ref 35.0–45.0)
Hemoglobin: 13.6 g/dL (ref 11.7–15.5)
MCH: 31.5 pg (ref 27.0–33.0)
MCHC: 34.9 g/dL (ref 32.0–36.0)
MCV: 90.3 fL (ref 80.0–100.0)
MPV: 9 fL (ref 7.5–12.5)
Platelets: 365 10*3/uL (ref 140–400)
RBC: 4.32 10*6/uL (ref 3.80–5.10)
RDW: 13.2 % (ref 11.0–15.0)
WBC: 10 10*3/uL (ref 3.8–10.8)

## 2017-04-23 LAB — FERRITIN: Ferritin: 45 ng/mL (ref 10–232)

## 2017-04-23 LAB — TSH: TSH: 3.59 m[IU]/L (ref 0.40–4.50)

## 2017-04-23 LAB — VITAMIN D 25 HYDROXY (VIT D DEFICIENCY, FRACTURES): Vit D, 25-Hydroxy: 88 ng/mL (ref 30–100)

## 2017-04-23 LAB — COMPREHENSIVE METABOLIC PANEL
AG Ratio: 2 (calc) (ref 1.0–2.5)
ALT: 15 U/L (ref 6–29)
AST: 19 U/L (ref 10–35)
Albumin: 4.5 g/dL (ref 3.6–5.1)
Alkaline phosphatase (APISO): 86 U/L (ref 33–130)
BILIRUBIN TOTAL: 0.7 mg/dL (ref 0.2–1.2)
BUN: 10 mg/dL (ref 7–25)
CALCIUM: 9.7 mg/dL (ref 8.6–10.4)
CO2: 27 mmol/L (ref 20–32)
Chloride: 106 mmol/L (ref 98–110)
Creat: 0.77 mg/dL (ref 0.50–1.05)
GLUCOSE: 86 mg/dL (ref 65–99)
Globulin: 2.3 g/dL (calc) (ref 1.9–3.7)
Potassium: 4.1 mmol/L (ref 3.5–5.3)
SODIUM: 139 mmol/L (ref 135–146)
TOTAL PROTEIN: 6.8 g/dL (ref 6.1–8.1)

## 2017-04-23 LAB — VITAMIN B12: Vitamin B-12: 475 pg/mL (ref 200–1100)

## 2017-04-23 LAB — SEDIMENTATION RATE: Sed Rate: 6 mm/h (ref 0–30)

## 2017-04-23 LAB — C-REACTIVE PROTEIN: CRP: 1.5 mg/L (ref <8.0)

## 2017-04-25 ENCOUNTER — Encounter: Payer: Self-pay | Admitting: Physician Assistant

## 2017-04-25 ENCOUNTER — Ambulatory Visit: Payer: BLUE CROSS/BLUE SHIELD | Admitting: Physician Assistant

## 2017-04-25 VITALS — BP 97/65 | HR 112 | Ht 64.0 in | Wt 149.0 lb

## 2017-04-25 DIAGNOSIS — G43009 Migraine without aura, not intractable, without status migrainosus: Secondary | ICD-10-CM | POA: Diagnosis not present

## 2017-04-25 DIAGNOSIS — G43709 Chronic migraine without aura, not intractable, without status migrainosus: Secondary | ICD-10-CM

## 2017-04-25 NOTE — Patient Instructions (Signed)

## 2017-04-25 NOTE — Progress Notes (Signed)
History:  Anne Mcdonald is a 57 y.o. [id number] who presents to clinic today for migraine followup.  Anne Mcdonald is having more headaches that are not responding to her usual medications.  She has no energy and is sleeping lots more.  She feels depressed and her psychiatrist just increased her lamictal by 25mg  earlier this week.  She has not yet started it.      Past Medical History:  Diagnosis Date  . Fibroid uterus   . PUD (peptic ulcer disease)     Social History   Socioeconomic History  . Marital status: Married    Spouse name: Not on file  . Number of children: Not on file  . Years of education: Not on file  . Highest education level: Not on file  Social Needs  . Financial resource strain: Not on file  . Food insecurity - worry: Not on file  . Food insecurity - inability: Not on file  . Transportation needs - medical: Not on file  . Transportation needs - non-medical: Not on file  Occupational History  . Not on file  Tobacco Use  . Smoking status: Current Every Day Smoker    Packs/day: 1.00    Types: Cigarettes  . Smokeless tobacco: Never Used  Substance and Sexual Activity  . Alcohol use: No  . Drug use: No  . Sexual activity: Not Currently    Birth control/protection: None, Surgical  Other Topics Concern  . Not on file  Social History Narrative  . Not on file    History reviewed. No pertinent family history.  No Known Allergies  Current Outpatient Medications on File Prior to Visit  Medication Sig Dispense Refill  . Ascorbic Acid (VITAMIN C) 1000 MG tablet Take by mouth.    . baclofen (LIORESAL) 10 MG tablet TAKE 1 TABLET BY MOUTH THREE TIMES A DAY 60 tablet 1  . CALCIUM-MAGNESIUM-ZINC PO Take by mouth.    . Cholecalciferol (VITAMIN D3) 5000 units CAPS Take by mouth.    . clobetasol ointment (TEMOVATE) 0.05 % Apply to palms of hands QD prn. 45 g 0  . COD LIVER OIL PO Take by mouth.    . gabapentin (NEURONTIN) 100 MG capsule TAKE 3 CAPSULES BY MOUTH TWICE  A DAY 180 capsule 1  . hydrOXYzine (VISTARIL) 25 MG capsule TAKE 1 TWICE DAILY AS NEEDED ANXIETY  0  . lamoTRIgine (LAMICTAL) 100 MG tablet   0  . lamoTRIgine (LAMICTAL) 25 MG tablet   0  . linaclotide (LINZESS) 290 MCG CAPS capsule TAKE 1 CAPSULE (290 MCG TOTAL) BY MOUTH DAILY. 90 capsule 4  . Meth-Hyo-M Bl-Na Phos-Ph Sal (URO-MP) 118 MG CAPS TAKE 1 CAPSULE BY MOUTH THREE TIMES A DAY AS NEEDED  3  . metoCLOPramide (REGLAN) 10 MG tablet Take 1 tablet (10 mg total) by mouth 3 (three) times daily as needed for nausea. 30 tablet 3  . metoprolol succinate (TOPROL-XL) 25 MG 24 hr tablet TAKE 1/2 TABLET BY MOUTH EVERY DAY 90 tablet 2  . Multiple Vitamin (THERA) TABS Take by mouth.    . pantoprazole (PROTONIX) 40 MG tablet Take 40 mg by mouth daily.  5  . rizatriptan (MAXALT) 10 MG tablet TAKE 1 TABLET AS NEEDED MIGRAINE MAY REPEAT IN 2 HOURS IF NEEDED MAX 2 PER DAY  5  . SUMAtriptan (IMITREX) 100 MG tablet TAKE 1 TABLET AS NEEDED FOR MIGRAINE. REPEAT IN 2 HOURS IF NEEDED. NOT TO EXCEED 2 TABS IN 24HOURS 9 tablet 1  . topiramate (  TOPAMAX) 50 MG tablet 50 mg.  0  . traZODone (DESYREL) 150 MG tablet Take 150 mg by mouth at bedtime.    Marland Kitchen venlafaxine XR (EFFEXOR-XR) 75 MG 24 hr capsule Take 225 mg by mouth.    Marland Kitchen VITAMIN E PO Take by mouth.     No current facility-administered medications on file prior to visit.      Review of Systems:  All pertinent positive/negative included in HPI, all other review of systems are negative   Objective:  Physical Exam BP 97/65   Pulse (!) 112   Ht 5\' 4"  (1.626 m)   Wt 149 lb (67.6 kg)   LMP 07/17/2016   BMI 25.58 kg/m  CONSTITUTIONAL: Well-developed, well-nourished female in no acute distress.  EYES: EOM intact ENT: Normocephalic CARDIOVASCULAR: Regular rate   RESPIRATORY: Normal rate.  MUSCULOSKELETAL: Normal ROM SKIN: Warm, dry without erythema  NEUROLOGICAL: Alert, oriented, CN II-XII grossly intact, Appropriate balance PSYCH: Normal behavior,  mood   Assessment & Plan:  Assessment: 1. Migraine without aura and without status migrainosus, not intractable   2. Chronic migraine without aura without status migrainosus, not intractable       Plan: Will begin Emgality.   Okay to continue current medications.  Will revisit them after Emgality begins to help.  Pt reminded not to mix triptans.   Encouraged to get outside as it is finally going to be better weather over the next few days.  She is encouraged to set small goals to make small things happen that will hopefully help her build energy and motivation step by tiny step. Follow-up in 3 months or sooner PRN  [name], PA-C 04/25/2017 11:46 AM

## 2017-04-27 ENCOUNTER — Encounter: Payer: Self-pay | Admitting: Physician Assistant

## 2017-05-02 ENCOUNTER — Encounter: Payer: Self-pay | Admitting: Sports Medicine

## 2017-05-02 ENCOUNTER — Ambulatory Visit (INDEPENDENT_AMBULATORY_CARE_PROVIDER_SITE_OTHER): Payer: BLUE CROSS/BLUE SHIELD

## 2017-05-02 ENCOUNTER — Ambulatory Visit: Payer: BLUE CROSS/BLUE SHIELD | Admitting: Sports Medicine

## 2017-05-02 DIAGNOSIS — G5601 Carpal tunnel syndrome, right upper limb: Secondary | ICD-10-CM

## 2017-05-02 DIAGNOSIS — M19041 Primary osteoarthritis, right hand: Secondary | ICD-10-CM | POA: Insufficient documentation

## 2017-05-02 DIAGNOSIS — M25542 Pain in joints of left hand: Secondary | ICD-10-CM | POA: Diagnosis not present

## 2017-05-02 DIAGNOSIS — M25541 Pain in joints of right hand: Secondary | ICD-10-CM

## 2017-05-02 DIAGNOSIS — M19042 Primary osteoarthritis, left hand: Secondary | ICD-10-CM

## 2017-05-02 MED ORDER — DICLOFENAC SODIUM 2 % TD SOLN: 2.0000 | Freq: Two times a day (BID) | TRANSDERMAL | 11 refills | Status: DC

## 2017-05-02 NOTE — Assessment & Plan Note (Addendum)
Bilateral MCP pain, first. Unable to take NSAIDs although she has been doing a few which have not been effective, Tylenol ineffective. Bilateral first MCP injections today. X-rays. Topical Pennsaid (diclofenac 2% topical). Return in 1 month to evaluate response.

## 2017-05-02 NOTE — Assessment & Plan Note (Signed)
Overall doing okay, continue nighttime splinting for now.

## 2017-05-02 NOTE — Progress Notes (Signed)
Subjective:    I'm seeing this patient as a consultation for: Dr. Beatrice Lecher  CC: Bilateral thumb pain  HPI: This is a pleasant 57 year old female, for the past several months she is in increasing pain that she localizes at the first MCP on both sides.  She is been taking some oral NSAIDs, but understands she should be avoiding these for now due to recent peptic ulcer disease.  He has tried Tylenol, no efficacy.  Symptoms are worse in the morning with gelling, and when the weather is bad.  Moderate, persistent without radiation.  No trauma.  Also has right-sided carpal tunnel syndrome, Dr. Madilyn Fireman appropriately placed her in a nighttime splint, and symptoms have improved considerably, she has a bit of paresthesias into the fingertips but overall better than before and continuing to improve.  I reviewed the past medical history, family history, social history, surgical history, and allergies today and no changes were needed.  Please see the problem list section below in epic for further details.  Past Medical History: Past Medical History:  Diagnosis Date  . Fibroid uterus   . PUD (peptic ulcer disease)    Past Surgical History: Past Surgical History:  Procedure Laterality Date  . CESAREAN SECTION    . CHOLECYSTECTOMY     Social History: Social History   Socioeconomic History  . Marital status: Married    Spouse name: None  . Number of children: None  . Years of education: None  . Highest education level: None  Social Needs  . Financial resource strain: None  . Food insecurity - worry: None  . Food insecurity - inability: None  . Transportation needs - medical: None  . Transportation needs - non-medical: None  Occupational History  . None  Tobacco Use  . Smoking status: Current Every Day Smoker    Packs/day: 1.00    Types: Cigarettes  . Smokeless tobacco: Never Used  Substance and Sexual Activity  . Alcohol use: No  . Drug use: No  . Sexual activity: Not  Currently    Birth control/protection: None, Surgical  Other Topics Concern  . None  Social History Narrative  . None   Family History: No family history on file. Allergies: No Known Allergies Medications: See med rec.  Review of Systems: No headache, visual changes, nausea, vomiting, diarrhea, constipation, dizziness, abdominal pain, skin rash, fevers, chills, night sweats, weight loss, swollen lymph nodes, body aches, joint swelling, muscle aches, chest pain, shortness of breath, mood changes, visual or auditory hallucinations.   Objective:   General: Well Developed, well nourished, and in no acute distress.  Neuro:  Extra-ocular muscles intact, able to move all 4 extremities, sensation grossly intact.  Deep tendon reflexes tested were normal. Psych: Alert and oriented, mood congruent with affect. ENT:  Ears and nose appear unremarkable.  Hearing grossly normal. Neck: Unremarkable overall appearance, trachea midline.  No visible thyroid enlargement. Eyes: Conjunctivae and lids appear unremarkable.  Pupils equal and round. Skin: Warm and dry, no rashes noted.  Cardiovascular: Pulses palpable, no extremity edema. Hands: Tenderness to palpation at the first MCP on both sides, swelling right worse than left.  Surprisingly no pain at the [facility] on both sides.  Negative Finkelstein sign bilaterally.  Procedure: Real-time Ultrasound Guided Injection of left first MCP Device: GE Logiq E  Verbal informed consent obtained.  Time-out conducted.  Noted no overlying erythema, induration, or other signs of local infection.  Skin prepped in a sterile fashion.  Local anesthesia: Topical  Ethyl chloride.  With sterile technique and under real time ultrasound guidance: 1/2 cc kenalog 40, 1/2 cc lidocaine injected easily Completed without difficulty  Pain immediately resolved suggesting accurate placement of the medication.  Advised to call if fevers/chills, erythema, induration, drainage, or  persistent bleeding.  Images permanently stored and available for review in the ultrasound unit.  Impression: Technically successful ultrasound guided injection.  Procedure: Real-time Ultrasound Guided Injection of right first MCP Device: GE Logiq E  Verbal informed consent obtained.  Time-out conducted.  Noted no overlying erythema, induration, or other signs of local infection.  Skin prepped in a sterile fashion.  Local anesthesia: Topical Ethyl chloride.  With sterile technique and under real time ultrasound guidance: 1/2 cc kenalog 40, 1/2 cc lidocaine injected easily Completed without difficulty  Pain immediately resolved suggesting accurate placement of the medication.  Advised to call if fevers/chills, erythema, induration, drainage, or persistent bleeding.  Images permanently stored and available for review in the ultrasound unit.  Impression: Technically successful ultrasound guided injection.  Impression and Recommendations:   This case required medical decision making of moderate complexity.  Primary osteoarthritis of both hands Bilateral MCP pain, first. Unable to take NSAIDs although she has been doing a few which have not been effective, Tylenol ineffective. Bilateral first MCP injections today. X-rays. Topical Pennsaid (diclofenac 2% topical). Return in 1 month to evaluate response.  Right carpal tunnel syndrome Overall doing okay, continue nighttime splinting for now. ___________________________________________ Gwen Her. Dianah Field, M.D., ABFM., CAQSM. Primary Care and [place] Instructor of [place] of [facility]

## 2017-05-21 ENCOUNTER — Other Ambulatory Visit: Payer: Self-pay | Admitting: Physician Assistant

## 2017-05-21 DIAGNOSIS — G43009 Migraine without aura, not intractable, without status migrainosus: Secondary | ICD-10-CM

## 2017-05-22 ENCOUNTER — Other Ambulatory Visit: Payer: Self-pay | Admitting: Physician Assistant

## 2017-05-22 DIAGNOSIS — G43709 Chronic migraine without aura, not intractable, without status migrainosus: Secondary | ICD-10-CM

## 2017-05-30 ENCOUNTER — Ambulatory Visit: Payer: BLUE CROSS/BLUE SHIELD | Admitting: Sports Medicine

## 2017-06-06 ENCOUNTER — Encounter: Payer: BLUE CROSS/BLUE SHIELD | Admitting: Physician Assistant

## 2017-06-09 ENCOUNTER — Telehealth: Payer: Self-pay | Admitting: Radiology

## 2017-06-09 NOTE — Telephone Encounter (Signed)
Left message on the cell phone voicemail to call [email or web address] to schedule appointment in May with Allie Dimmer

## 2017-08-06 ENCOUNTER — Encounter: Payer: Self-pay | Admitting: Sports Medicine

## 2017-08-06 ENCOUNTER — Ambulatory Visit: Payer: BLUE CROSS/BLUE SHIELD | Admitting: Sports Medicine

## 2017-08-06 DIAGNOSIS — M65312 Trigger thumb, left thumb: Secondary | ICD-10-CM

## 2017-08-06 NOTE — Patient Instructions (Signed)
Trigger Finger Trigger finger (stenosing tenosynovitis) is a condition that causes a finger to get stuck in a bent position. Each finger has a tough, cord-like tissue that connects muscle to bone (tendon), and each tendon is surrounded by a tunnel of tissue (tendon sheath). To move your finger, your tendon needs to slide freely through the sheath. Trigger finger happens when the tendon or the sheath thickens, making it difficult to move your finger. Trigger finger can affect any finger or a thumb. It may affect more than one finger. Mild cases may clear up with rest and medicine. Severe cases require more treatment. What are the causes? Trigger finger is caused by a thickened finger tendon or tendon sheath. The cause of this thickening is not known. What increases the risk? The following factors may make you more likely to develop this condition:  Doing activities that require a strong grip.  Having rheumatoid arthritis, gout, or diabetes.  Being 40-60 years old.  Being a woman.  What are the signs or symptoms? Symptoms of this condition include:  Pain when bending or straightening your finger.  Tenderness or swelling where your finger attaches to the palm of your hand.  A lump in the palm of your hand or on the inside of your finger.  Hearing a popping sound when you try to straighten your finger.  Feeling a popping, catching, or locking sensation when you try to straighten your finger.  Being unable to straighten your finger.  How is this diagnosed? This condition is diagnosed based on your symptoms and a physical exam. How is this treated? This condition may be treated by:  Resting your finger and avoiding activities that make symptoms worse.  Wearing a finger splint to keep your finger in a slightly bent position.  Taking NSAIDs to relieve pain and swelling.  Injecting medicine (steroids) into the tendon sheath to reduce swelling and irritation. Injections may need to be  repeated.  Having surgery to open the tendon sheath. This may be done if other treatments do not work and you cannot straighten your finger. You may need physical therapy after surgery.  Follow these instructions at home:  Use moist heat to help reduce pain and swelling as told by your health care provider.  Rest your finger and avoid activities that make pain worse. Return to normal activities as told by your health care provider.  If you have a splint, wear it as told by your health care provider.  Take over-the-counter and prescription medicines only as told by your health care provider.  Keep all follow-up visits as told by your health care provider. This is important. Contact a health care provider if:  Your symptoms are not improving with home care. Summary  Trigger finger (stenosing tenosynovitis) causes your finger to get stuck in a bent position, and it can make it difficult and painful to straighten your finger.  This condition develops when a finger tendon or tendon sheath thickens.  Treatment starts with resting, wearing a splint, and taking NSAIDs.  In severe cases, surgery to open the tendon sheath may be needed. This information is not intended to replace advice given to you by your health care provider. Make sure you discuss any questions you have with your health care provider. Document Released: 12/30/2003 Document Revised: 02/20/2016 Document Reviewed: 02/20/2016 Elsevier Interactive Patient Education  2017 Elsevier Inc.  

## 2017-08-06 NOTE — Assessment & Plan Note (Signed)
Bilateral first MCP injections were effective at the last visit, symptoms today are on the left and she has more of a trigger thumb with a palpable flexor tendon nodule. Flexor pollicis longus tendon sheath injection, return to see me in 1 month. Rehab exercises given.

## 2017-08-06 NOTE — Progress Notes (Signed)
Subjective:    I'm seeing this patient as a consultation for: Dr. Beatrice Lecher  CC: Left thumb pain  HPI: This is a very pleasant 57 year old female, several months ago I injected her bilateral first metacarpophalangeal joint, she improved on the right but continues to have pain on the left, she localizes over the volar thumb with triggering of the thumb itself.  Pain is moderate, persistent.  I reviewed the past medical history, family history, social history, surgical history, and allergies today and no changes were needed.  Please see the problem list section below in epic for further details.  Past Medical History: Past Medical History:  Diagnosis Date  . Fibroid uterus   . PUD (peptic ulcer disease)    Past Surgical History: Past Surgical History:  Procedure Laterality Date  . CESAREAN SECTION    . CHOLECYSTECTOMY     Social History: Social History   Socioeconomic History  . Marital status: Married    Spouse name: Not on file  . Number of children: Not on file  . Years of education: Not on file  . Highest education level: Not on file  Occupational History  . Not on file  Social Needs  . Financial resource strain: Not on file  . Food insecurity:    Worry: Not on file    Inability: Not on file  . Transportation needs:    Medical: Not on file    Non-medical: Not on file  Tobacco Use  . Smoking status: Current Every Day Smoker    Packs/day: 1.00    Types: Cigarettes  . Smokeless tobacco: Never Used  Substance and Sexual Activity  . Alcohol use: No  . Drug use: No  . Sexual activity: Not Currently    Birth control/protection: None, Surgical  Lifestyle  . Physical activity:    Days per week: Not on file    Minutes per session: Not on file  . Stress: Not on file  Relationships  . Social connections:    Talks on phone: Not on file    Gets together: Not on file    Attends religious service: Not on file    Active member of club or organization: Not  on file    Attends meetings of clubs or organizations: Not on file    Relationship status: Not on file  Other Topics Concern  . Not on file  Social History Narrative  . Not on file   Family History: No family history on file. Allergies: No Known Allergies Medications: See med rec.  Review of Systems: No headache, visual changes, nausea, vomiting, diarrhea, constipation, dizziness, abdominal pain, skin rash, fevers, chills, night sweats, weight loss, swollen lymph nodes, body aches, joint swelling, muscle aches, chest pain, shortness of breath, mood changes, visual or auditory hallucinations.   Objective:   General: Well Developed, well nourished, and in no acute distress.  Neuro:  Extra-ocular muscles intact, able to move all 4 extremities, sensation grossly intact.  Deep tendon reflexes tested were normal. Psych: Alert and oriented, mood congruent with affect. ENT:  Ears and nose appear unremarkable.  Hearing grossly normal. Neck: Unremarkable overall appearance, trachea midline.  No visible thyroid enlargement. Eyes: Conjunctivae and lids appear unremarkable.  Pupils equal and round. Skin: Warm and dry, no rashes noted.  Cardiovascular: Pulses palpable, no extremity edema. Left hand: Palpable flexor tendon nodule at the flexor pollicis longus, tender, palpable triggering.  Procedure: Real-time Ultrasound Guided Injection of left flexor pollicis longus tendon sheath Device: GE  Logiq E  Verbal informed consent obtained.  Time-out conducted.  Noted no overlying erythema, induration, or other signs of local infection.  Skin prepped in a sterile fashion.  Local anesthesia: Topical Ethyl chloride.  With sterile technique and under real time ultrasound guidance: I advanced a 25-gauge needle into the tendon sheath and injected 1/2 cc Kenalog 40, 1/2 cc lidocaine Completed without difficulty  Pain immediately resolved suggesting accurate placement of the medication.  Advised to call if  fevers/chills, erythema, induration, drainage, or persistent bleeding.  Images permanently stored and available for review in the ultrasound unit.  Impression: Technically successful ultrasound guided injection.  Impression and Recommendations:   This case required medical decision making of moderate complexity.  Trigger thumb, left thumb Bilateral first MCP injections were effective at the last visit, symptoms today are on the left and she has more of a trigger thumb with a palpable flexor tendon nodule. Flexor pollicis longus tendon sheath injection, return to see me in 1 month. Rehab exercises given. ___________________________________________ Gwen Her. Dianah Field, M.D., ABFM., CAQSM. Primary Care and [place] Instructor of [place] of [facility] of Medicine

## 2017-09-03 ENCOUNTER — Encounter: Payer: Self-pay | Admitting: Sports Medicine

## 2017-09-03 ENCOUNTER — Ambulatory Visit: Payer: BLUE CROSS/BLUE SHIELD | Admitting: Sports Medicine

## 2017-09-03 DIAGNOSIS — M65312 Trigger thumb, left thumb: Secondary | ICD-10-CM | POA: Diagnosis not present

## 2017-09-03 NOTE — Assessment & Plan Note (Signed)
Flexor pollicis longus sheath injection at the last visit provided 100% relief, no mechanical symptoms, no pain, return as needed.

## 2017-09-03 NOTE — Progress Notes (Signed)
  Subjective:    CC: Follow-up  HPI: Trigger thumb, left: Completely resolved after injection at the last visit.  I reviewed the past medical history, family history, social history, surgical history, and allergies today and no changes were needed.  Please see the problem list section below in epic for further details.  Past Medical History: Past Medical History:  Diagnosis Date  . Fibroid uterus   . PUD (peptic ulcer disease)    Past Surgical History: Past Surgical History:  Procedure Laterality Date  . CESAREAN SECTION    . CHOLECYSTECTOMY     Social History: Social History   Socioeconomic History  . Marital status: Married    Spouse name: Not on file  . Number of children: Not on file  . Years of education: Not on file  . Highest education level: Not on file  Occupational History  . Not on file  Social Needs  . Financial resource strain: Not on file  . Food insecurity:    Worry: Not on file    Inability: Not on file  . Transportation needs:    Medical: Not on file    Non-medical: Not on file  Tobacco Use  . Smoking status: Current Every Day Smoker    Packs/day: 1.00    Types: Cigarettes  . Smokeless tobacco: Never Used  Substance and Sexual Activity  . Alcohol use: No  . Drug use: No  . Sexual activity: Not Currently    Birth control/protection: None, Surgical  Lifestyle  . Physical activity:    Days per week: Not on file    Minutes per session: Not on file  . Stress: Not on file  Relationships  . Social connections:    Talks on phone: Not on file    Gets together: Not on file    Attends religious service: Not on file    Active member of club or organization: Not on file    Attends meetings of clubs or organizations: Not on file    Relationship status: Not on file  Other Topics Concern  . Not on file  Social History Narrative  . Not on file   Family History: No family history on file. Allergies: No Known Allergies Medications: See med  rec.  Review of Systems: No fevers, chills, night sweats, weight loss, chest pain, or shortness of breath.   Objective:    General: Well Developed, well nourished, and in no acute distress.  Neuro: Alert and oriented x3, extra-ocular muscles intact, sensation grossly intact.  HEENT: Normocephalic, atraumatic, pupils equal round reactive to light, neck supple, no masses, no lymphadenopathy, thyroid nonpalpable.  Skin: Warm and dry, no rashes. Cardiac: Regular rate and rhythm, no murmurs rubs or gallops, no lower extremity edema.  Respiratory: Clear to auscultation bilaterally. Not using accessory muscles, speaking in full sentences.  Impression and Recommendations:    Trigger thumb, left thumb Flexor pollicis longus sheath injection at the last visit provided 100% relief, no mechanical symptoms, no pain, return as needed. ___________________________________________ Gwen Her. Dianah Field, M.D., ABFM., CAQSM. Primary Care and [place] Instructor of [place] of [facility] of Medicine

## 2018-03-26 ENCOUNTER — Encounter: Payer: Self-pay | Admitting: Family Medicine

## 2018-03-26 ENCOUNTER — Ambulatory Visit: Payer: BLUE CROSS/BLUE SHIELD | Admitting: Family Medicine

## 2018-03-26 VITALS — BP 96/69 | HR 89 | Ht 64.0 in | Wt 142.0 lb

## 2018-03-26 DIAGNOSIS — J101 Influenza due to other identified influenza virus with other respiratory manifestations: Secondary | ICD-10-CM

## 2018-03-26 DIAGNOSIS — R059 Cough, unspecified: Secondary | ICD-10-CM

## 2018-03-26 DIAGNOSIS — R05 Cough: Secondary | ICD-10-CM

## 2018-03-26 DIAGNOSIS — J069 Acute upper respiratory infection, unspecified: Secondary | ICD-10-CM | POA: Diagnosis not present

## 2018-03-26 DIAGNOSIS — S39012A Strain of muscle, fascia and tendon of lower back, initial encounter: Secondary | ICD-10-CM

## 2018-03-26 LAB — POCT INFLUENZA A/B
INFLUENZA A, POC: NEGATIVE
INFLUENZA B, POC: POSITIVE — AB

## 2018-03-26 MED ORDER — CYCLOBENZAPRINE HCL 10 MG PO TABS: 10.0000 mg | ORAL_TABLET | Freq: Three times a day (TID) | ORAL | 0 refills | Status: DC | PRN

## 2018-03-26 MED ORDER — PREDNISONE 20 MG PO TABS: 40.0000 mg | ORAL_TABLET | Freq: Every day | ORAL | 0 refills | Status: DC

## 2018-03-26 MED ORDER — HYDROCODONE-ACETAMINOPHEN 5-325 MG PO TABS: 1.0000 | ORAL_TABLET | Freq: Four times a day (QID) | ORAL | 0 refills | Status: DC | PRN

## 2018-03-26 MED ORDER — OSELTAMIVIR PHOSPHATE 75 MG PO CAPS: 75.0000 mg | ORAL_CAPSULE | Freq: Two times a day (BID) | ORAL | 0 refills | Status: DC

## 2018-03-26 NOTE — Patient Instructions (Signed)

## 2018-03-26 NOTE — Progress Notes (Signed)
Acute Office Visit  Subjective:    Patient ID: Anne Mcdonald, female    DOB: 1960/08/26, 58 y.o.   MRN: [id number]  Chief Complaint  Patient presents with  . Back Pain  . Hip Pain  . Nasal Congestion    HPI Patient is in today for nasal congestion and cough with no fever this morning.  She had a mild sore throat last night but that seems actually better today.  She feels like she is getting a little congestion in her chest.  She is not used any cough or cold medications.  No fevers chills or sweats.  Hx of herniated disc in her low back, diagnosed back in 2005.  She works for [employer] and  Pain started on Monday morning across her low back. mOstly aching but can be sharp at times.   She says she does get some pain radiating down to her legs when she lays down but not when standing.  While working at [employer] she is been doing a lot of lifting bending twisting climbing ladders and walks about 7 to 8 miles per day.  Past Medical History:  Diagnosis Date  . Fibroid uterus   . PUD (peptic ulcer disease)     Past Surgical History:  Procedure Laterality Date  . CESAREAN SECTION    . CHOLECYSTECTOMY      No family history on file.  Social History   Socioeconomic History  . Marital status: Married    Spouse name: Not on file  . Number of children: Not on file  . Years of education: Not on file  . Highest education level: Not on file  Occupational History  . Not on file  Social Needs  . Financial resource strain: Not on file  . Food insecurity:    Worry: Not on file    Inability: Not on file  . Transportation needs:    Medical: Not on file    Non-medical: Not on file  Tobacco Use  . Smoking status: Current Every Day Smoker    Packs/day: 1.00    Types: Cigarettes  . Smokeless tobacco: Never Used  Substance and Sexual Activity  . Alcohol use: No  . Drug use: No  . Sexual activity: Not Currently    Birth control/protection: None, Surgical  Lifestyle  . Physical  activity:    Days per week: Not on file    Minutes per session: Not on file  . Stress: Not on file  Relationships  . Social connections:    Talks on phone: Not on file    Gets together: Not on file    Attends religious service: Not on file    Active member of club or organization: Not on file    Attends meetings of clubs or organizations: Not on file    Relationship status: Not on file  . Intimate partner violence:    Fear of current or ex partner: Not on file    Emotionally abused: Not on file    Physically abused: Not on file    Forced sexual activity: Not on file  Other Topics Concern  . Not on file  Social History Narrative  . Not on file    Outpatient Medications Prior to Visit  Medication Sig Dispense Refill  . Ascorbic Acid (VITAMIN C) 1000 MG tablet Take by mouth.    Marland Kitchen CALCIUM-MAGNESIUM-ZINC PO Take by mouth.    . Cholecalciferol (VITAMIN D3) 5000 units CAPS Take by mouth.    . clobetasol  ointment (TEMOVATE) 0.05 % Apply to palms of hands QD prn. 45 g 0  . COD LIVER OIL PO Take by mouth.    Marland Kitchen Galcanezumab-gnlm (EMGALITY [place]) Inject into the skin.    . hydrOXYzine (VISTARIL) 25 MG capsule TAKE 1 TWICE DAILY AS NEEDED ANXIETY  0  . lamoTRIgine (LAMICTAL) 25 MG tablet   0  . linaclotide (LINZESS) 290 MCG CAPS capsule TAKE 1 CAPSULE (290 MCG TOTAL) BY MOUTH DAILY. 90 capsule 4  . metoprolol succinate (TOPROL-XL) 25 MG 24 hr tablet TAKE 1/2 TABLET BY MOUTH EVERY DAY 90 tablet 2  . Multiple Vitamin (THERA) TABS Take by mouth.    . pantoprazole (PROTONIX) 40 MG tablet Take 40 mg by mouth daily.  5  . topiramate (TOPAMAX) 50 MG tablet 50 mg.  0  . VITAMIN E PO Take by mouth.    . Diclofenac Sodium 2 % SOLN Place 2 sprays onto the skin 2 (two) times daily. 1 Bottle 11  . gabapentin (NEURONTIN) 100 MG capsule TAKE 3 CAPSULES BY MOUTH TWICE A DAY 180 capsule 1  . gabapentin (NEURONTIN) 100 MG capsule TAKE 3 CAPSULES (300 MG TOTAL) BY MOUTH 3 (THREE) TIMES DAILY. 180 capsule 3  .  Meth-Hyo-M Bl-Na Phos-Ph Sal (URO-MP) 118 MG CAPS TAKE 1 CAPSULE BY MOUTH THREE TIMES A DAY AS NEEDED  3  . metoCLOPramide (REGLAN) 10 MG tablet Take 1 tablet (10 mg total) by mouth 3 (three) times daily as needed for nausea. 30 tablet 3  . rizatriptan (MAXALT) 10 MG tablet TAKE 1 TABLET AS NEEDED MIGRAINE MAY REPEAT IN 2 HOURS IF NEEDED MAX 2 PER DAY  5  . SUMAtriptan (IMITREX) 100 MG tablet TAKE 1 TABLET AS NEEDED FOR MIGRAINE. REPEAT IN 2 HOURS IF NEEDED. NOT TO EXCEED 2 TABS IN 24HOURS 9 tablet 1  . traZODone (DESYREL) 150 MG tablet Take 150 mg by mouth at bedtime.    Marland Kitchen venlafaxine XR (EFFEXOR-XR) 75 MG 24 hr capsule Take 225 mg by mouth.     No facility-administered medications prior to visit.     Allergies  Allergen Reactions  . Latex Rash  . Other Rash    ROS     Objective:    Physical Exam  Constitutional: She is oriented to person, place, and time. She appears well-developed and well-nourished.  HENT:  Head: Normocephalic and atraumatic.  Right Ear: External ear normal.  Left Ear: External ear normal.  Nose: Nose normal.  Mouth/Throat: Oropharynx is clear and moist.  TMs and canals are clear.   Eyes: Pupils are equal, round, and reactive to light. Conjunctivae and EOM are normal.  Neck: Neck supple. No thyromegaly present.  Cardiovascular: Normal rate, regular rhythm and normal heart sounds.  Pulmonary/Chest: Effort normal and breath sounds normal. She has no wheezes.  Musculoskeletal:     Comments: Back with normal flexion she had more pain coming back up from the flexion position.  Limited extension.  Some pain with rotation to the right and to the left.  Nontender over the SI joint she is a little bit tender over the mid lumbar spine.  Hip, knee, ankle strength is 5-5 bilaterally.  Reflexes 2+ bilaterally.  Lymphadenopathy:    She has no cervical adenopathy.  Neurological: She is alert and oriented to person, place, and time.  Skin: Skin is warm and dry.   Psychiatric: She has a normal mood and affect.    BP 96/69   Pulse 89   Ht 5'  4" (1.626 m)   Wt 142 lb (64.4 kg)   LMP 07/17/2016   SpO2 100%   BMI 24.37 kg/m  Wt Readings from Last 3 Encounters:  03/26/18 142 lb (64.4 kg)  09/03/17 151 lb (68.5 kg)  08/06/17 153 lb (69.4 kg)    Health Maintenance Due  Topic Date Due  . Hepatitis C Screening  1961-03-01  . HIV Screening  11/05/1974  . MAMMOGRAM  04/11/2017  . INFLUENZA VACCINE  10/23/2017  . PAP SMEAR-Modifier  11/29/2017    There are no preventive care reminders to display for this patient.   Lab Results  Component Value Date   TSH 3.59 04/22/2017   Lab Results  Component Value Date   WBC 10.0 04/22/2017   HGB 13.6 04/22/2017   HCT 39.0 04/22/2017   MCV 90.3 04/22/2017   PLT 365 04/22/2017   Lab Results  Component Value Date   NA 139 04/22/2017   K 4.1 04/22/2017   CO2 27 04/22/2017   GLUCOSE 86 04/22/2017   BUN 10 04/22/2017   CREATININE 0.77 04/22/2017   BILITOT 0.7 04/22/2017   ALKPHOS 86 11/26/2016   AST 19 04/22/2017   ALT 15 04/22/2017   PROT 6.8 04/22/2017   ALBUMIN 4.4 11/26/2016   CALCIUM 9.7 04/22/2017   Lab Results  Component Value Date   CHOL 226 (H) 04/22/2017   Lab Results  Component Value Date   HDL 67 04/22/2017   Lab Results  Component Value Date   LDLCALC 133 (H) 04/22/2017   Lab Results  Component Value Date   TRIG 143 04/22/2017   Lab Results  Component Value Date   CHOLHDL 3.4 04/22/2017   No results found for: HGBA1C     Assessment & Plan:   Problem List Items Addressed This Visit    None    Visit Diagnoses    Low back strain, initial encounter    -  Primary   Cough       Viral upper respiratory tract infection       Relevant Medications   oseltamivir (TAMIFLU) 75 MG capsule   Other Relevant Orders   POCT Influenza A/B (Completed)   Influenza B       Relevant Medications   oseltamivir (TAMIFLU) 75 MG capsule     Low back strain-she has a history  of herniated disc.  Discussed the importance of stretching, exercises.  We will do prednisone as well as muscle relaxer.  Call if not improving over the next couple of weeks.  Could consider x-ray and formal PT if not improving.  Influeza - likely viral. Flu test is pos. Will tx with tamiflu for the flu. OK for symptomatic OTC meds.  Call if not better in one week.    Meds ordered this encounter  Medications  . predniSONE (DELTASONE) 20 MG tablet    Sig: Take 2 tablets (40 mg total) by mouth daily with breakfast.    Dispense:  10 tablet    Refill:  0  . cyclobenzaprine (FLEXERIL) 10 MG tablet    Sig: Take 1 tablet (10 mg total) by mouth 3 (three) times daily as needed for muscle spasms.    Dispense:  30 tablet    Refill:  0  . HYDROcodone-acetaminophen (NORCO/VICODIN) 5-325 MG tablet    Sig: Take 1 tablet by mouth every 6 (six) hours as needed for moderate pain.    Dispense:  15 tablet    Refill:  0  . oseltamivir (TAMIFLU)  75 MG capsule    Sig: Take 1 capsule (75 mg total) by mouth 2 (two) times daily. X 10 days    Dispense:  10 capsule    Refill:  0     Beatrice Lecher, MD

## 2018-04-28 ENCOUNTER — Telehealth: Payer: Self-pay | Admitting: Family Medicine

## 2018-04-28 ENCOUNTER — Other Ambulatory Visit: Payer: Self-pay | Admitting: Family Medicine

## 2018-04-28 ENCOUNTER — Other Ambulatory Visit: Payer: Self-pay | Admitting: *Deleted

## 2018-04-28 DIAGNOSIS — K5901 Slow transit constipation: Secondary | ICD-10-CM

## 2018-04-28 NOTE — Telephone Encounter (Signed)
I received a fax that the preferred drug is Trulance over [place] per [employer]. Has she ever failed Trulance? Please advise.

## 2018-04-30 MED ORDER — PLECANATIDE 3 MG PO TABS: 3.0000 mg | ORAL_TABLET | Freq: Every day | ORAL | 5 refills | Status: DC

## 2018-04-30 NOTE — Telephone Encounter (Signed)
I don't see that we have tried this before.  We need to call her and find out like she has tried it through GI before.

## 2018-04-30 NOTE — Telephone Encounter (Signed)
Patient reports that she has not tried Trulance before and is ok to switch the medication since its the preferred on her insurance. Patient repots she has received a letter about the denial of Linzess.  I have cancelled the linzess at the pharmacy. Please advise.

## 2018-04-30 NOTE — Telephone Encounter (Signed)
Ok, new rx sent 

## 2018-06-06 ENCOUNTER — Emergency Department (INDEPENDENT_AMBULATORY_CARE_PROVIDER_SITE_OTHER): Payer: BLUE CROSS/BLUE SHIELD

## 2018-06-06 ENCOUNTER — Other Ambulatory Visit: Payer: Self-pay

## 2018-06-06 ENCOUNTER — Encounter: Payer: Self-pay | Admitting: Emergency Medicine

## 2018-06-06 ENCOUNTER — Emergency Department
Admission: EM | Admit: 2018-06-06 | Discharge: 2018-06-06 | Disposition: A | Discharge status: Home / Self Care | Payer: BLUE CROSS/BLUE SHIELD | Source: Home / Self Care | Attending: Family Medicine | Admitting: Family Medicine

## 2018-06-06 DIAGNOSIS — R05 Cough: Secondary | ICD-10-CM | POA: Diagnosis not present

## 2018-06-06 DIAGNOSIS — J209 Acute bronchitis, unspecified: Secondary | ICD-10-CM | POA: Diagnosis not present

## 2018-06-06 DIAGNOSIS — R0989 Other specified symptoms and signs involving the circulatory and respiratory systems: Secondary | ICD-10-CM | POA: Diagnosis not present

## 2018-06-06 MED ORDER — IPRATROPIUM-ALBUTEROL 0.5-2.5 (3) MG/3ML IN SOLN
3.0000 mL | Freq: Once | RESPIRATORY_TRACT | Status: AC
  Administered 2018-06-06: 3 mL via RESPIRATORY_TRACT

## 2018-06-06 MED ORDER — PREDNISONE 20 MG PO TABS: ORAL_TABLET | ORAL | 0 refills | Status: DC

## 2018-06-06 MED ORDER — DOXYCYCLINE HYCLATE 100 MG PO CAPS: 100.0000 mg | ORAL_CAPSULE | Freq: Two times a day (BID) | ORAL | 0 refills | Status: DC

## 2018-06-06 NOTE — ED Provider Notes (Signed)
[facility]    CSN: [id number] Arrival date & time: 06/06/18  1147     History   Chief Complaint Chief Complaint  Patient presents with  . Cough  . Nasal Congestion    HPI Anne Mcdonald is a 58 y.o. female.   About one week ago patient developed typical cold-like symptoms developing over several days, including mild sore throat, sinus congestion, fatigue, myalgias, and cough.  Her symptoms have persisted resulting in tightness in her anterior chest and occasional wheezing and shortness of breath.  Her cough is non-productive.  The history is provided by the patient.    Past Medical History:  Diagnosis Date  . Fibroid uterus   . PUD (peptic ulcer disease)     Patient Active Problem List   Diagnosis Date Noted  . Trigger thumb, left thumb 08/06/2017  . Primary osteoarthritis of both hands 05/02/2017  . Right carpal tunnel syndrome 05/02/2017  . Dyshidrotic eczema 12/24/2016  . Bipolar 1 disorder ([place]) 08/02/2016  . Moderate episode of recurrent major depressive disorder ([place]) 05/07/2016  . Uterine fibroid 09/18/2015  . Chronic migraine without aura without status migrainosus, not intractable 05/02/2015  . Muscle spasm 05/02/2015  . Medication overuse headache 05/02/2015  . Headache, menstrual migraine, intractable, with status migrainosus 12/09/2014  . GERD (gastroesophageal reflux disease) 11/29/2014  . Paroxysmal SVT (supraventricular tachycardia) ([place]) 06/10/2012  . CIGARETTE SMOKER 08/24/2009  . Migraine without aura 09/07/2008  . HYPERTRIGLYCERIDEMIA 07/19/2008  . PREMENSTRUAL DYSPHORIC SYNDROME 04/24/2006  . HOT FLASHES 04/24/2006  . BACK PAIN 04/24/2006  . FATIGUE 04/24/2006    Past Surgical History:  Procedure Laterality Date  . CESAREAN SECTION    . CHOLECYSTECTOMY      OB History    Gravida  5   Para  5   Term  5   Preterm      AB      Living  5     SAB      TAB      Ectopic      Multiple      Live Births  5             Home Medications    Prior to Admission medications   Medication Sig Start Date End Date Taking? Authorizing Provider  Ascorbic Acid (VITAMIN C) 1000 MG tablet Take by mouth.    [provider]  CALCIUM-MAGNESIUM-ZINC PO Take by mouth.    [provider]  Cholecalciferol (VITAMIN D3) 5000 units CAPS Take by mouth.    [provider]  clobetasol ointment (TEMOVATE) 0.05 % Apply to palms of hands QD prn. 12/24/16   Hali Marry, MD  COD LIVER OIL PO Take by mouth.    [provider]  doxycycline (VIBRAMYCIN) 100 MG capsule Take 1 capsule (100 mg total) by mouth 2 (two) times daily. Take with food. 06/06/18   Kandra Nicolas, MD  Galcanezumab-gnlm [facility] [place]) Inject into the skin.    [provider]  hydrOXYzine (VISTARIL) 25 MG capsule TAKE 1 TWICE DAILY AS NEEDED ANXIETY 04/10/17   [provider]  lamoTRIgine (LAMICTAL) 150 MG tablet  04/25/18   [provider]  lamoTRIgine (LAMICTAL) 25 MG tablet  04/22/17   [provider]  metoprolol succinate (TOPROL-XL) 25 MG 24 hr tablet TAKE 1/2 TABLET BY MOUTH EVERY DAY 04/03/17   Hali Marry, MD  Multiple Vitamin (THERA) TABS Take by mouth.    [provider]  pantoprazole ([place])  40 MG tablet Take 40 mg by mouth daily. 03/21/17   [provider]  Plecanatide (TRULANCE) 3 MG TABS Take 3 mg by mouth daily. 04/30/18   Hali Marry, MD  predniSONE (DELTASONE) 20 MG tablet Take one tab by mouth twice daily for 4 days, then one daily. Take with food. 06/06/18   Kandra Nicolas, MD  topiramate (TOPAMAX) 50 MG tablet 50 mg. 03/10/17   [provider]  VITAMIN E PO Take by mouth.    [provider]    Family History Patient reports no family history  Social History Social History   Tobacco Use  . Smoking status: Current Every Day Smoker    Packs/day: 1.00    Types: Cigarettes  . Smokeless tobacco: Never  Used  Substance Use Topics  . Alcohol use: No  . Drug use: No     Allergies   Other   Review of Systems Review of Systems + sore throat + cough No pleuritic pain, but feels tightness over anterior chest. + wheezing + nasal congestion + post-nasal drainage No sinus pain/pressure No itchy/red eyes No earache No hemoptysis + SOB No fever/chills No nausea No vomiting No abdominal pain No diarrhea No urinary symptoms No skin rash + fatigue + myalgias No headache Used OTC meds without relief   Physical Exam Triage Vital Signs ED Triage Vitals  Enc Vitals Group     BP 06/06/18 1251 109/68     Pulse Rate 06/06/18 1251 78     Resp 06/06/18 1251 18     Temp 06/06/18 1251 (!) 97.5 F (36.4 C)     Temp Source 06/06/18 1251 Oral     SpO2 06/06/18 1251 100 %     Weight 06/06/18 1252 141 lb 1.5 oz (64 kg)     Height 06/06/18 1252 5\' 4"  (1.626 m)     Head Circumference --      Peak Flow --      Pain Score 06/06/18 1252 0     Pain Loc --      Pain Edu? --      Excl. in [place]? --    No data found.  Updated Vital Signs BP 109/68 (BP Location: Right Arm)   Pulse 78   Temp (!) 97.5 F (36.4 C) (Oral)   Resp 18   Ht 5\' 4"  (1.626 m)   Wt 64 kg   LMP 07/17/2016   SpO2 100%   BMI 24.22 kg/m   Visual Acuity Right Eye Distance:   Left Eye Distance:   Bilateral Distance:    Right Eye Near:   Left Eye Near:    Bilateral Near:     Physical Exam Chest:     Nursing notes and Vital Signs reviewed. Appearance:  Patient appears stated age, and in no acute distress Eyes:  Pupils are equal, round, and reactive to light and accomodation.  Extraocular movement is intact.  Conjunctivae are not inflamed  Ears:  Canals normal.  Tympanic membranes normal.  Nose:  Mildly congested turbinates.  No sinus tenderness. Pharynx:  Normal Neck:  Supple.  No adenopathy Lungs:  Diffuse anterior wheezes and rhonchi with cough.  Breath sounds are equal.  Breath sounds improved after  neb treatment.  Moving air well. Chest:  Distinct tenderness to palpation over the mid-sternum (see diagram above). Heart:  Regular rate and rhythm without murmurs, rubs, or gallops.  Abdomen:  Nontender without masses or hepatosplenomegaly.  Bowel sounds are present.  No  CVA or flank tenderness.  Extremities:  No edema.  Skin:  No rash present.     UC Treatments / Results  Labs (all labs ordered are listed, but only abnormal results are displayed) Labs Reviewed - No data to display  EKG None  Radiology Dg Chest 2 View  Result Date: 06/06/2018 CLINICAL DATA:  Cough and congestion EXAM: CHEST - 2 VIEW COMPARISON:  June 24, 2015 FINDINGS: The lungs are clear. The heart size and pulmonary vascularity are normal. No adenopathy. No bone lesions. There are surgical clips in the right upper quadrant of the abdomen. IMPRESSION: No edema or consolidation. Electronically Signed   By: Lowella Grip III M.D.   On: 06/06/2018 14:15    Procedures Procedures (including critical care time)  Medications Ordered in UC Medications  ipratropium-albuterol (DUONEB) 0.5-2.5 (3) MG/3ML nebulizer solution 3 mL (3 mLs Nebulization Given 06/06/18 1435)    Initial Impression / Assessment and Plan / UC Course  I have reviewed the triage vital signs and the nursing notes.  Pertinent labs & imaging results that were available during my care of the patient were reviewed by me and considered in my medical decision making (see chart for details).    Administered DuoNeb by hand held nebulizer Begin prednisone burst/taper, and doxycycline. Followup with Family Doctor if not improved in about 10 days.   Final Clinical Impressions(s) / UC Diagnoses   Final diagnoses:  Bronchospasm with bronchitis, acute     Discharge Instructions     Take plain guaifenesin (1200mg  extended release tabs such as Mucinex) twice daily, with plenty of water, for cough and congestion. Get adequate rest.   May continue  codeine cough syrup at bedtime. Stop all antihistamines for now, and other non-prescription cough/cold preparations. If symptoms become significantly worse during the night or over the weekend, proceed to the local emergency room.       ED Prescriptions    Medication Sig Dispense Auth. Provider   doxycycline (VIBRAMYCIN) 100 MG capsule Take 1 capsule (100 mg total) by mouth 2 (two) times daily. Take with food. 20 capsule Kandra Nicolas, MD   predniSONE (DELTASONE) 20 MG tablet Take one tab by mouth twice daily for 4 days, then one daily. Take with food. 12 tablet Kandra Nicolas, MD         Kandra Nicolas, MD 06/09/18 [phone]

## 2018-06-06 NOTE — Discharge Instructions (Addendum)
Take plain guaifenesin (1200mg  extended release tabs such as Mucinex) twice daily, with plenty of water, for cough and congestion. Get adequate rest.   May continue codeine cough syrup at bedtime. Stop all antihistamines for now, and other non-prescription cough/cold preparations. If symptoms become significantly worse during the night or over the weekend, proceed to the local emergency room.

## 2018-06-06 NOTE — ED Triage Notes (Signed)
Patient began having sore throat with cough and congestion 7 days ago; felt better during week but cannot clear cough and sometimes feels short of breath.

## 2018-06-06 NOTE — ED Triage Notes (Signed)
Patient is hard of hearing; does not wear aids.

## 2018-06-26 ENCOUNTER — Telehealth: Payer: Self-pay

## 2018-06-26 NOTE — Telephone Encounter (Signed)
Okay, added true [place] to intolerance list.  We could always try Amitiza which is still prescription while we are waiting to see if the Linzess would get covered.  I cannot remember if she had tried that before

## 2018-06-26 NOTE — Telephone Encounter (Signed)
Anne Mcdonald called and states the Trulance makes her stomach ache. She states she would like a letter sent to the insurance company for an exception for [name]. She states that the [name] works very well. I will send note to [name] to start a letter to [name]. She wanted to know if there is anything else she could take until we hear back from the insurance company. Please advise.

## 2018-06-27 ENCOUNTER — Other Ambulatory Visit: Payer: Self-pay | Admitting: Family Medicine

## 2018-06-29 MED ORDER — LUBIPROSTONE 24 MCG PO CAPS: 24.0000 ug | ORAL_CAPSULE | Freq: Two times a day (BID) | ORAL | 0 refills | Status: DC

## 2018-06-29 NOTE — Telephone Encounter (Signed)
rx for Amitiza sent.

## 2018-06-29 NOTE — Telephone Encounter (Signed)
Left brief VM for patient to call back about note below and I will work on [place] for the patient to see if we can get this approved. Patient was asked to call back and let me know if she has previously been on Amitiza.

## 2018-06-29 NOTE — Telephone Encounter (Signed)
Patient called and reports that she has not been on Amitiza before and she is willing to start this while we are waiting on insurance to send [place]. Pharmacy on file is correct. Please advise.

## 2018-06-30 MED ORDER — LINACLOTIDE 145 MCG PO CAPS: 145.0000 ug | ORAL_CAPSULE | Freq: Every day | ORAL | 3 refills | Status: DC

## 2018-06-30 NOTE — Telephone Encounter (Signed)
I have sent information to insurance and I am waiting on a response.

## 2018-06-30 NOTE — Addendum Note (Signed)
Addended by: Beatrice Lecher D on: 06/30/2018 02:39 PM   Modules accepted: Orders

## 2018-06-30 NOTE — Telephone Encounter (Signed)
OK, new rx sent. Old meds taken off med list.

## 2018-06-30 NOTE — Telephone Encounter (Addendum)
Approved today (Linzess) Effective from 06/30/2018 through 06/29/2019. Pharmacy aware.   Will the patient be on the 145 or 290 mcg dose of this medication? Can you send this to the pharmacy? Please advise.

## 2018-07-01 ENCOUNTER — Other Ambulatory Visit: Payer: Self-pay | Admitting: Family Medicine

## 2018-07-06 ENCOUNTER — Other Ambulatory Visit: Payer: Self-pay | Admitting: Family Medicine

## 2018-07-06 MED ORDER — METOPROLOL SUCCINATE ER 25 MG PO TB24: 12.5000 mg | ORAL_TABLET | Freq: Every day | ORAL | 0 refills | Status: DC

## 2018-07-08 ENCOUNTER — Other Ambulatory Visit: Payer: Self-pay | Admitting: *Deleted

## 2018-07-08 DIAGNOSIS — Z1231 Encounter for screening mammogram for malignant neoplasm of breast: Secondary | ICD-10-CM

## 2018-07-14 ENCOUNTER — Encounter: Payer: Self-pay | Admitting: Family Medicine

## 2018-07-14 ENCOUNTER — Ambulatory Visit (INDEPENDENT_AMBULATORY_CARE_PROVIDER_SITE_OTHER): Payer: BLUE CROSS/BLUE SHIELD | Admitting: Family Medicine

## 2018-07-14 ENCOUNTER — Other Ambulatory Visit: Payer: Self-pay

## 2018-07-14 VITALS — Ht 64.0 in | Wt 146.0 lb

## 2018-07-14 DIAGNOSIS — G43009 Migraine without aura, not intractable, without status migrainosus: Secondary | ICD-10-CM | POA: Diagnosis not present

## 2018-07-14 DIAGNOSIS — L219 Seborrheic dermatitis, unspecified: Secondary | ICD-10-CM

## 2018-07-14 DIAGNOSIS — E781 Pure hyperglyceridemia: Secondary | ICD-10-CM | POA: Diagnosis not present

## 2018-07-14 DIAGNOSIS — G2581 Restless legs syndrome: Secondary | ICD-10-CM | POA: Diagnosis not present

## 2018-07-14 MED ORDER — ROPINIROLE HCL 0.25 MG PO TABS: 0.2500 mg | ORAL_TABLET | Freq: Every day | ORAL | 1 refills | Status: DC

## 2018-07-14 MED ORDER — KETOCONAZOLE 2 % EX SHAM: 1.0000 "application " | MEDICATED_SHAMPOO | CUTANEOUS | 6 refills | Status: DC

## 2018-07-14 MED ORDER — BETAMETHASONE VALERATE 0.12 % EX FOAM: 1.0000 "application " | Freq: Every day | CUTANEOUS | 1 refills | Status: DC | PRN

## 2018-07-14 NOTE — Progress Notes (Signed)
Virtual Visit via Video Note  I connected with Anne Mcdonald on 07/14/18 at  1:00 PM EDT by a video enabled telemedicine application and verified that I am speaking with the correct person using two identifiers.   I discussed the limitations of evaluation and management by telemedicine and the availability of in person appointments. The patient expressed understanding and agreed to proceed.  Patient at home and I was in home office for the visit.   Subjective:    CC: ? RLS  HPI:  She thinks she has restless leg. Says her sxs have gotten worse in the last 2-3 months. Has tried some OTC meds. Will lay on th couch at night and will get a funny feeling and feel ike she has to get up and walk around.  She takes trazodone at night and wonders if that could be triggers it.     She also c/o of dry scale x 2 months.  She has dry flakey skin and has tried some medicated OTC shampoos and these haven't help.  Anti-dandruff shampoo.    F/u chronic migarines HA - she has been on [facility] with Neurology. Says it has really helped her HAs. She has been out for the last month waiting to hear back form their office.  She has had more HA since has been out of medication.    Past medical history, Surgical history, Family history not pertinant except as noted below, Social history, Allergies, and medications have been entered into the medical record, reviewed, and corrections made.   Review of Systems: No fevers, chills, night sweats, weight loss, chest pain, or shortness of breath.   Objective:    General: Speaking clearly in complete sentences without any shortness of breath.  Alert and oriented x3.  Normal judgment. No apparent acute distress. Well groomed.    Impression and Recommendations:   RLS - discussed diagnosis.  Recommend evaluate for anemia when able to get labs. Recommend trial of ropinirole.  May need to adjust dose. F/U in 4-6 weeks.   Dry scalp- likely seborrheic dermatitis.  Consider checking thyroid. Will tx with ketoconazole shampoo and topical steroid, luxiq. Call back if the [place] is too expensive.    Migraine HAs - she has been done great on [place].  She may want to call their office if has been a months now that she is waiting for approval on the Rx.      I discussed the assessment and treatment plan with the patient. The patient was provided an opportunity to ask questions and all were answered. The patient agreed with the plan and demonstrated an understanding of the instructions.   The patient was advised to call back or seek an in-person evaluation if the symptoms worsen or if the condition fails to improve as anticipated.   Beatrice Lecher, MD

## 2018-07-15 ENCOUNTER — Encounter: Payer: Self-pay | Admitting: Family Medicine

## 2018-07-15 DIAGNOSIS — L219 Seborrheic dermatitis, unspecified: Secondary | ICD-10-CM | POA: Insufficient documentation

## 2018-07-15 DIAGNOSIS — G2581 Restless legs syndrome: Secondary | ICD-10-CM | POA: Insufficient documentation

## 2018-07-20 ENCOUNTER — Telehealth: Payer: Self-pay

## 2018-07-20 NOTE — Telephone Encounter (Signed)
Del called and left a message stating the RLS medication is not helping at all. Please advise.

## 2018-07-20 NOTE — Telephone Encounter (Signed)
Okay, then I would encourage her to go ahead and go get the blood work done so that we can make sure that something else is not causing it.  In addition okay to go up to 2 tabs of the ropinirole at bedtime.  Have her try to stick with that for at least a week or 2 to see if she notices any improvement.

## 2018-07-20 NOTE — Telephone Encounter (Signed)
Patient advised of recommendations.  

## 2018-07-22 LAB — COMPLETE METABOLIC PANEL WITH GFR
AG Ratio: 2 (calc) (ref 1.0–2.5)
ALT: 11 U/L (ref 6–29)
AST: 16 U/L (ref 10–35)
Albumin: 4.3 g/dL (ref 3.6–5.1)
Alkaline phosphatase (APISO): 74 U/L (ref 37–153)
BUN: 19 mg/dL (ref 7–25)
CO2: 26 mmol/L (ref 20–32)
Calcium: 10 mg/dL (ref 8.6–10.4)
Chloride: 105 mmol/L (ref 98–110)
Creat: 0.79 mg/dL (ref 0.50–1.05)
GFR, Est African American: 96 mL/min/{1.73_m2} (ref >=60)
GFR, Est Non African American: 83 mL/min/{1.73_m2} (ref >=60)
Globulin: 2.1 g/dL (calc) (ref 1.9–3.7)
Glucose, Bld: 93 mg/dL (ref 65–99)
Potassium: 4.5 mmol/L (ref 3.5–5.3)
Sodium: 140 mmol/L (ref 135–146)
Total Bilirubin: 0.6 mg/dL (ref 0.2–1.2)
Total Protein: 6.4 g/dL (ref 6.1–8.1)

## 2018-07-22 LAB — LIPID PANEL
Cholesterol: 202 mg/dL — ABNORMAL HIGH (ref <200)
HDL: 58 mg/dL (ref >=50)
LDL Cholesterol (Calc): 124 mg/dL (calc) — ABNORMAL HIGH
Non-HDL Cholesterol (Calc): 144 mg/dL (calc) — ABNORMAL HIGH (ref <130)
Total CHOL/HDL Ratio: 3.5 (calc) (ref <5.0)
Triglycerides: 98 mg/dL (ref <150)

## 2018-07-22 LAB — CBC
HCT: 41.1 % (ref 35.0–45.0)
Hemoglobin: 14.3 g/dL (ref 11.7–15.5)
MCH: 32.4 pg (ref 27.0–33.0)
MCHC: 34.8 g/dL (ref 32.0–36.0)
MCV: 93.2 fL (ref 80.0–100.0)
MPV: 9 fL (ref 7.5–12.5)
Platelets: 330 Thousand/uL (ref 140–400)
RBC: 4.41 Million/uL (ref 3.80–5.10)
RDW: 12.8 % (ref 11.0–15.0)
WBC: 9.9 Thousand/uL (ref 3.8–10.8)

## 2018-07-22 LAB — FERRITIN: Ferritin: 40 ng/mL (ref 16–232)

## 2018-07-22 LAB — TSH: TSH: 2.45 m[IU]/L (ref 0.40–4.50)

## 2018-07-27 ENCOUNTER — Encounter: Payer: Self-pay | Admitting: Sports Medicine

## 2018-07-27 ENCOUNTER — Ambulatory Visit: Payer: BLUE CROSS/BLUE SHIELD | Admitting: Sports Medicine

## 2018-07-27 DIAGNOSIS — I471 Supraventricular tachycardia, unspecified: Secondary | ICD-10-CM

## 2018-07-27 LAB — COMPLETE METABOLIC PANEL WITH GFR
AG Ratio: 2.3 (calc) (ref 1.0–2.5)
ALT: 12 U/L (ref 6–29)
AST: 17 U/L (ref 10–35)
Albumin: 4.4 g/dL (ref 3.6–5.1)
Alkaline phosphatase (APISO): 71 U/L (ref 37–153)
BUN: 14 mg/dL (ref 7–25)
CO2: 26 mmol/L (ref 20–32)
Calcium: 9 mg/dL (ref 8.6–10.4)
Chloride: 106 mmol/L (ref 98–110)
Creat: 0.81 mg/dL (ref 0.50–1.05)
GFR, Est African American: 93 mL/min/{1.73_m2} (ref >=60)
GFR, Est Non African American: 80 mL/min/{1.73_m2} (ref >=60)
Globulin: 1.9 g/dL (calc) (ref 1.9–3.7)
Glucose, Bld: 128 mg/dL — ABNORMAL HIGH (ref 65–99)
Potassium: 3.9 mmol/L (ref 3.5–5.3)
Sodium: 138 mmol/L (ref 135–146)
Total Bilirubin: 0.6 mg/dL (ref 0.2–1.2)
Total Protein: 6.3 g/dL (ref 6.1–8.1)

## 2018-07-27 LAB — CBC
HCT: 38.3 % (ref 35.0–45.0)
Hemoglobin: 13.6 g/dL (ref 11.7–15.5)
MCH: 33.2 pg — ABNORMAL HIGH (ref 27.0–33.0)
MCHC: 35.5 g/dL (ref 32.0–36.0)
MCV: 93.4 fL (ref 80.0–100.0)
MPV: 8.6 fL (ref 7.5–12.5)
Platelets: 342 10*3/uL (ref 140–400)
RBC: 4.1 10*6/uL (ref 3.80–5.10)
RDW: 12.8 % (ref 11.0–15.0)
WBC: 9.2 10*3/uL (ref 3.8–10.8)

## 2018-07-27 LAB — TROPONIN I: Troponin I: <0.01 ng/mL (ref <=0.0)

## 2018-07-27 LAB — CK TOTAL AND CKMB (NOT AT ARMC)
CK, MB: 1.2 ng/mL (ref 0–5.0)
Relative Index: 2.3 (ref 0–4.0)
Total CK: 52 U/L (ref 29–143)

## 2018-07-27 LAB — D-DIMER, QUANTITATIVE: D-Dimer, Quant: 0.26 mcg/mL FEU (ref <0.50)

## 2018-07-27 NOTE — Assessment & Plan Note (Signed)
Episode of exertional syncope today while walking her dog, no chest pain, nausea, diaphoresis during the episode, she had a bit of nausea afterwards. ECG, labs including cardiac enzymes. I do want a stress test, treadmill. 2D echocardiogram. Ultimately I think she probably became orthostatic, she is currently on metoprolol, after the work-up is complete we may switch her to [facility] which should have less of an effect on her blood pressure.

## 2018-07-27 NOTE — Progress Notes (Signed)
Subjective:    CC: Exertional syncope  HPI: This is a pleasant 58 year old female with a history of SVT.  Currently on metoprolol, symptoms have been well controlled for years.  She had a stress test about a decade ago that was normal.  While walking her dog today at a fairly slow pace she developed presyncopal symptoms, no chest pain, no shortness of breath, no palpitations.  Ultimately she started to see stars and then felt like she passed out, she was able to lower herself slowly to the ground.  She came to immediately, she did not feel as though she bit her tongue, her lips, and had no incontinence.  She had no chest pain, no subsequent symptoms.  Everything lasted maybe a couple of minutes maximum.  Symptoms have all now resolved.  I reviewed the past medical history, family history, social history, surgical history, and allergies today and no changes were needed.  Please see the problem list section below in epic for further details.  Past Medical History: Past Medical History:  Diagnosis Date  . Fibroid uterus   . PUD (peptic ulcer disease)    Past Surgical History: Past Surgical History:  Procedure Laterality Date  . CESAREAN SECTION    . CHOLECYSTECTOMY     Social History: Social History   Socioeconomic History  . Marital status: Married    Spouse name: Not on file  . Number of children: Not on file  . Years of education: Not on file  . Highest education level: Not on file  Occupational History  . Not on file  Social Needs  . Financial resource strain: Not on file  . Food insecurity:    Worry: Not on file    Inability: Not on file  . Transportation needs:    Medical: Not on file    Non-medical: Not on file  Tobacco Use  . Smoking status: Current Every Day Smoker    Packs/day: 1.00    Types: Cigarettes  . Smokeless tobacco: Never Used  Substance and Sexual Activity  . Alcohol use: No  . Drug use: No  . Sexual activity: Not Currently    Birth  control/protection: None, Surgical  Lifestyle  . Physical activity:    Days per week: Not on file    Minutes per session: Not on file  . Stress: Not on file  Relationships  . Social connections:    Talks on phone: Not on file    Gets together: Not on file    Attends religious service: Not on file    Active member of club or organization: Not on file    Attends meetings of clubs or organizations: Not on file    Relationship status: Not on file  Other Topics Concern  . Not on file  Social History Narrative  . Not on file   Family History: No family history on file. Allergies: Allergies  Allergen Reactions  . Other Rash  . Trulance [Plecanatide] Other (See Comments)    Abdominal pain   Medications: See med rec.  Review of Systems: No fevers, chills, night sweats, weight loss, chest pain, or shortness of breath.   Objective:    General: Well Developed, well nourished, and in no acute distress.  Neuro: Alert and oriented x3, extra-ocular muscles intact, sensation grossly intact.  HEENT: Normocephalic, atraumatic, pupils equal round reactive to light, neck supple, no masses, no lymphadenopathy, thyroid nonpalpable.  Skin: Warm and dry, no rashes. Cardiac: Regular rate and rhythm, no murmurs rubs  or gallops, no lower extremity edema.  Respiratory: Clear to auscultation bilaterally. Not using accessory muscles, speaking in full sentences.  Twelve-lead ECG personally reviewed, normal sinus rhythm, normal axis, no PR changes, no ST changes.  Impression and Recommendations:    Paroxysmal SVT (supraventricular tachycardia) Episode of exertional syncope today while walking her dog, no chest pain, nausea, diaphoresis during the episode, she had a bit of nausea afterwards. ECG, labs including cardiac enzymes. I do want a stress test, treadmill. 2D echocardiogram. Ultimately I think she probably became orthostatic, she is currently on metoprolol, after the work-up is complete we may  switch her to [facility] which should have less of an effect on her blood pressure.   ___________________________________________ Gwen Her. Dianah Field, M.D., ABFM., CAQSM. Primary Care and Sports Medicine [facility]  Adjunct Professor of [employer] [facility] [employer]

## 2018-08-05 ENCOUNTER — Other Ambulatory Visit: Payer: Self-pay | Admitting: Family Medicine

## 2018-08-13 ENCOUNTER — Ambulatory Visit (INDEPENDENT_AMBULATORY_CARE_PROVIDER_SITE_OTHER): Payer: BLUE CROSS/BLUE SHIELD

## 2018-08-13 ENCOUNTER — Other Ambulatory Visit: Payer: Self-pay

## 2018-08-13 DIAGNOSIS — Z1231 Encounter for screening mammogram for malignant neoplasm of breast: Secondary | ICD-10-CM | POA: Diagnosis not present

## 2018-08-14 ENCOUNTER — Encounter: Payer: Self-pay | Admitting: Physician Assistant

## 2018-08-14 ENCOUNTER — Ambulatory Visit: Payer: BLUE CROSS/BLUE SHIELD | Admitting: Physician Assistant

## 2018-08-14 VITALS — BP 104/75 | HR 75

## 2018-08-14 DIAGNOSIS — G43709 Chronic migraine without aura, not intractable, without status migrainosus: Secondary | ICD-10-CM

## 2018-08-14 DIAGNOSIS — G43009 Migraine without aura, not intractable, without status migrainosus: Secondary | ICD-10-CM | POA: Diagnosis not present

## 2018-08-14 MED ORDER — RIZATRIPTAN BENZOATE 10 MG PO TABS: 10.0000 mg | ORAL_TABLET | ORAL | 11 refills | Status: DC | PRN

## 2018-08-14 MED ORDER — GALCANEZUMAB-GNLM 120 MG/ML ~~LOC~~ SOAJ: SUBCUTANEOUS | 11 refills | Status: DC

## 2018-08-14 NOTE — Patient Instructions (Signed)

## 2018-08-14 NOTE — Progress Notes (Signed)
Refill Emgality   History:  Anne Mcdonald is a 58 y.o. [id number] who presents to clinic today for headache followup.  Emgality was working great but she has been out of the medication and the headaches have been worse.  She has been unable to get maxalt due to an insurance issue.  It works when she has it to take.  Lately she has been using unknown otc medication for acute headache.     HIT6:64 Number of days in the last 4 weeks with:  Severe headache: 2 Moderate headache: 13 Mild headache: 0  No headache: 13  Past Medical History:  Diagnosis Date  . Fibroid uterus   . PUD (peptic ulcer disease)     Social History   Socioeconomic History  . Marital status: Married    Spouse name: Not on file  . Number of children: Not on file  . Years of education: Not on file  . Highest education level: Not on file  Occupational History  . Not on file  Social Needs  . Financial resource strain: Not on file  . Food insecurity:    Worry: Not on file    Inability: Not on file  . Transportation needs:    Medical: Not on file    Non-medical: Not on file  Tobacco Use  . Smoking status: Current Every Day Smoker    Packs/day: 1.00    Types: Cigarettes  . Smokeless tobacco: Never Used  Substance and Sexual Activity  . Alcohol use: No  . Drug use: No  . Sexual activity: Not Currently    Birth control/protection: None, Surgical  Lifestyle  . Physical activity:    Days per week: Not on file    Minutes per session: Not on file  . Stress: Not on file  Relationships  . Social connections:    Talks on phone: Not on file    Gets together: Not on file    Attends religious service: Not on file    Active member of club or organization: Not on file    Attends meetings of clubs or organizations: Not on file    Relationship status: Not on file  . Intimate partner violence:    Fear of current or ex partner: Not on file    Emotionally abused: Not on file    Physically abused: Not on file   Forced sexual activity: Not on file  Other Topics Concern  . Not on file  Social History Narrative  . Not on file    No family history on file.  Allergies  Allergen Reactions  . Other Rash  . Trulance [Plecanatide] Other (See Comments)    Abdominal pain    Current Outpatient Medications on File Prior to Visit  Medication Sig Dispense Refill  . Betamethasone Valerate 0.12 % foam Apply 1 application topically daily as needed. Stop after 2 weeks of use. 100 g 1  . CALCIUM-MAGNESIUM-ZINC PO Take by mouth.    . Cholecalciferol (VITAMIN D3) 5000 units CAPS Take by mouth.    . clobetasol ointment (TEMOVATE) 0.05 % Apply to palms of hands QD prn. 45 g 0  . DEXILANT 60 MG capsule TAKE ONE CAPSULE (60 MG DOSE) BY MOUTH 30 (THIRTY) MINUTES BEFORE BREAKFAST.    Marland Kitchen Galcanezumab-gnlm (EMGALITY [place]) Inject into the skin.    . hydrOXYzine (VISTARIL) 25 MG capsule TAKE 1 TWICE DAILY AS NEEDED ANXIETY  0  . lamoTRIgine (LAMICTAL) 200 MG tablet Take 200 mg by mouth daily.    Marland Kitchen  linaclotide (LINZESS) 145 MCG CAPS capsule Take 1 capsule (145 mcg total) by mouth daily before breakfast. 30 capsule 3  . metoprolol succinate (TOPROL-XL) 25 MG 24 hr tablet Take 0.5 tablets (12.5 mg total) by mouth daily. 90 tablet 0  . Multiple Vitamin (THERA) TABS Take by mouth.    . Omega-3 Fatty Acids (FISH OIL) 1000 MG CAPS Take by mouth.    Marland Kitchen rOPINIRole (REQUIP) 0.25 MG tablet TAKE 1 TABLET (0.25 MG TOTAL) BY MOUTH AT BEDTIME. 90 tablet 1  . traZODone (DESYREL) 150 MG tablet TAKE 1 TABLET BY MOUTH EVERYDAY AT BEDTIME    . VITAMIN E PO Take by mouth.    . Ascorbic Acid (VITAMIN C) 1000 MG tablet Take by mouth.    Marland Kitchen ketoconazole (NIZORAL) 2 % shampoo Apply 1 application topically 2 (two) times a week. (Patient not taking: Reported on 08/14/2018) 120 mL 6   No current facility-administered medications on file prior to visit.      Review of Systems:  All pertinent positive/negative included in HPI, all other review of  systems are negative   Objective:  Physical Exam BP 104/75   Pulse 75   LMP 07/17/2016  CONSTITUTIONAL: Well-developed, well-nourished female in no acute distress.  EYES: EOM intact ENT: Normocephalic CARDIOVASCULAR: Regular rate  RESPIRATORY: Normal rate.  MUSCULOSKELETAL: Normal ROM SKIN: Warm, dry without erythema  NEUROLOGICAL: Alert, oriented, CN II-XII grossly intact except hard of hearing, Appropriate balance PSYCH: Normal behavior, mood   Assessment & Plan:  Assessment: 1. Migraine without aura and without status migrainosus, not intractable   2. Chronic migraine without aura without status migrainosus, not intractable    Improved on medication although currently out of med and therefore worse  Plan: Restart Emgality with 2 injections today and then one monthly.  Sample provided Restart Maxalt for acute HA Follow-up in 12 months or sooner PRN  [name], PA-C 08/14/2018 10:10 AM

## 2018-08-18 ENCOUNTER — Other Ambulatory Visit: Payer: Self-pay | Admitting: Family Medicine

## 2018-08-18 DIAGNOSIS — R928 Other abnormal and inconclusive findings on diagnostic imaging of breast: Secondary | ICD-10-CM

## 2018-08-19 ENCOUNTER — Other Ambulatory Visit: Payer: Self-pay

## 2018-08-19 ENCOUNTER — Ambulatory Visit (HOSPITAL_BASED_OUTPATIENT_CLINIC_OR_DEPARTMENT_OTHER)
Admission: RE | Admit: 2018-08-19 | Discharge: 2018-08-19 | Disposition: A | Discharge status: Home / Self Care | Payer: BLUE CROSS/BLUE SHIELD | Source: Ambulatory Visit | Attending: Sports Medicine | Admitting: Sports Medicine

## 2018-08-19 DIAGNOSIS — I471 Supraventricular tachycardia: Secondary | ICD-10-CM | POA: Diagnosis present

## 2018-08-19 NOTE — Progress Notes (Signed)
  Echocardiogram 2D Echocardiogram has been performed.  Anne Mcdonald 08/19/2018, 2:56 PM

## 2018-08-21 ENCOUNTER — Encounter: Payer: Self-pay | Admitting: Sports Medicine

## 2018-08-21 ENCOUNTER — Ambulatory Visit: Payer: BLUE CROSS/BLUE SHIELD | Admitting: Sports Medicine

## 2018-08-21 ENCOUNTER — Other Ambulatory Visit: Payer: Self-pay

## 2018-08-21 DIAGNOSIS — I471 Supraventricular tachycardia, unspecified: Secondary | ICD-10-CM

## 2018-08-21 DIAGNOSIS — G2581 Restless legs syndrome: Secondary | ICD-10-CM | POA: Diagnosis not present

## 2018-08-21 MED ORDER — ROPINIROLE HCL 1 MG PO TABS: 1.0000 mg | ORAL_TABLET | Freq: Every day | ORAL | 3 refills | Status: DC

## 2018-08-21 MED ORDER — NEBIVOLOL HCL 2.5 MG PO TABS: 2.5000 mg | ORAL_TABLET | Freq: Every day | ORAL | 3 refills | Status: DC

## 2018-08-21 NOTE — Assessment & Plan Note (Signed)
History of exertional syncope, walking her dog. No chest pain, nausea, diaphoresis. ECG, labs including cardiac enzymes were unremarkable. 2D echocardiogram showed normal cardiac structure. Unable to get her treadmill stress test due to COVID-19. We are going to proceed with 72-hour Holter monitoring, I am also going to switch her from metoprolol to Bystolic as this will likely have less of an effect on her blood pressure. Return to see me in another month and we can go over the Holter results and see how Bystolic is working, and up taper if needed.

## 2018-08-21 NOTE — Assessment & Plan Note (Signed)
Only doubling her dose of Requip, taking 0.5 mg, still with mild symptoms and needs a refill, increasing to 1 mg nightly.

## 2018-08-21 NOTE — Progress Notes (Signed)
Subjective:    CC: Follow-up  HPI: This is a pleasant 58 year old female, we have been treating her for episodes of palpitations, she does have history of PSVT, traditionally has been on metoprolol, 12.5 mg.  She was unable to get a stress test, but she did not have any chest pain during this episode, echo was normal, other labs were normal.  In addition she has restless leg syndrome, treated with Requip, 0.25 mg in 0.5 mg were ineffective.  I reviewed the past medical history, family history, social history, surgical history, and allergies today and no changes were needed.  Please see the problem list section below in epic for further details.  Past Medical History: Past Medical History:  Diagnosis Date  . Fibroid uterus   . PUD (peptic ulcer disease)    Past Surgical History: Past Surgical History:  Procedure Laterality Date  . CESAREAN SECTION    . CHOLECYSTECTOMY     Social History: Social History   Socioeconomic History  . Marital status: Married    Spouse name: Not on file  . Number of children: Not on file  . Years of education: Not on file  . Highest education level: Not on file  Occupational History  . Not on file  Social Needs  . Financial resource strain: Not on file  . Food insecurity:    Worry: Not on file    Inability: Not on file  . Transportation needs:    Medical: Not on file    Non-medical: Not on file  Tobacco Use  . Smoking status: Current Every Day Smoker    Packs/day: 1.00    Types: Cigarettes  . Smokeless tobacco: Never Used  Substance and Sexual Activity  . Alcohol use: No  . Drug use: No  . Sexual activity: Not Currently    Birth control/protection: None, Surgical  Lifestyle  . Physical activity:    Days per week: Not on file    Minutes per session: Not on file  . Stress: Not on file  Relationships  . Social connections:    Talks on phone: Not on file    Gets together: Not on file    Attends religious service: Not on file   Active member of club or organization: Not on file    Attends meetings of clubs or organizations: Not on file    Relationship status: Not on file  Other Topics Concern  . Not on file  Social History Narrative  . Not on file   Family History: No family history on file. Allergies: Allergies  Allergen Reactions  . Other Rash  . Trulance [Plecanatide] Other (See Comments)    Abdominal pain   Medications: See med rec.  Review of Systems: No fevers, chills, night sweats, weight loss, chest pain, or shortness of breath.   Objective:    General: Well Developed, well nourished, and in no acute distress.  Neuro: Alert and oriented x3, extra-ocular muscles intact, sensation grossly intact.  HEENT: Normocephalic, atraumatic, pupils equal round reactive to light, neck supple, no masses, no lymphadenopathy, thyroid nonpalpable.  Skin: Warm and dry, no rashes. Cardiac: Regular rate and rhythm, no murmurs rubs or gallops, no lower extremity edema.  Respiratory: Clear to auscultation bilaterally. Not using accessory muscles, speaking in full sentences.  Impression and Recommendations:    Paroxysmal SVT (supraventricular tachycardia) History of exertional syncope, walking her dog. No chest pain, nausea, diaphoresis. ECG, labs including cardiac enzymes were unremarkable. 2D echocardiogram showed normal cardiac structure. Unable to  get her treadmill stress test due to COVID-19. We are going to proceed with 72-hour Holter monitoring, I am also going to switch her from metoprolol to Bystolic as this will likely have less of an effect on her blood pressure. Return to see me in another month and we can go over the Holter results and see how Bystolic is working, and up taper if needed.  RLS (restless legs syndrome) Only doubling her dose of Requip, taking 0.5 mg, still with mild symptoms and needs a refill, increasing to 1 mg nightly.   ___________________________________________ Gwen Her.  Dianah Field, M.D., ABFM., CAQSM. Primary Care and Sports Medicine [facility]  Adjunct Professor of [employer] [facility] [employer] Medicine

## 2018-08-24 ENCOUNTER — Telehealth: Payer: Self-pay | Admitting: *Deleted

## 2018-08-24 ENCOUNTER — Encounter: Payer: Self-pay | Admitting: *Deleted

## 2018-08-24 ENCOUNTER — Ambulatory Visit
Admission: RE | Admit: 2018-08-24 | Discharge: 2018-08-24 | Disposition: A | Discharge status: Home / Self Care | Payer: BLUE CROSS/BLUE SHIELD | Source: Ambulatory Visit | Attending: Family Medicine | Admitting: Family Medicine

## 2018-08-24 ENCOUNTER — Other Ambulatory Visit: Payer: Self-pay

## 2018-08-24 ENCOUNTER — Other Ambulatory Visit: Payer: Self-pay | Admitting: Family Medicine

## 2018-08-24 DIAGNOSIS — N6320 Unspecified lump in the left breast, unspecified quadrant: Secondary | ICD-10-CM

## 2018-08-24 DIAGNOSIS — R928 Other abnormal and inconclusive findings on diagnostic imaging of breast: Secondary | ICD-10-CM

## 2018-08-24 NOTE — Telephone Encounter (Signed)
Faxed over prior auth approval for emgality to [facility].

## 2018-08-24 NOTE — Telephone Encounter (Signed)
[employer] to ship a 3 day long term holter monitor to your home.  Instructions reviewed briefly as they are included in the monitor kit.

## 2018-08-26 ENCOUNTER — Telehealth: Payer: Self-pay

## 2018-08-26 NOTE — Telephone Encounter (Signed)
Prior Authorization is required for Bystolic.

## 2018-08-26 NOTE — Telephone Encounter (Signed)
Information has been sent to insurance and waiting on a response.   

## 2018-08-27 NOTE — Telephone Encounter (Signed)
Approvedon June 3 (Bystolic) Effective from 47/15/9539 through 08/24/2021. Pharmacy aware.

## 2018-08-30 ENCOUNTER — Ambulatory Visit (INDEPENDENT_AMBULATORY_CARE_PROVIDER_SITE_OTHER): Payer: BC Managed Care – PPO

## 2018-08-30 DIAGNOSIS — I471 Supraventricular tachycardia: Secondary | ICD-10-CM

## 2018-09-15 ENCOUNTER — Other Ambulatory Visit: Payer: Self-pay

## 2018-09-18 ENCOUNTER — Ambulatory Visit (INDEPENDENT_AMBULATORY_CARE_PROVIDER_SITE_OTHER): Payer: BC Managed Care – PPO

## 2018-09-18 ENCOUNTER — Other Ambulatory Visit: Payer: Self-pay

## 2018-09-18 ENCOUNTER — Ambulatory Visit: Payer: BC Managed Care – PPO | Admitting: Sports Medicine

## 2018-09-18 ENCOUNTER — Encounter: Payer: Self-pay | Admitting: Sports Medicine

## 2018-09-18 DIAGNOSIS — F17200 Nicotine dependence, unspecified, uncomplicated: Secondary | ICD-10-CM | POA: Diagnosis not present

## 2018-09-18 DIAGNOSIS — I471 Supraventricular tachycardia, unspecified: Secondary | ICD-10-CM

## 2018-09-18 DIAGNOSIS — G2581 Restless legs syndrome: Secondary | ICD-10-CM

## 2018-09-18 NOTE — Patient Instructions (Signed)
Orthostatic Hypotension °Blood pressure is a measurement of how strongly, or weakly, your blood is pressing against the walls of your arteries. Orthostatic hypotension is a sudden drop in blood pressure that happens when you quickly change positions, such as when you get up from sitting or lying down. °Arteries are blood vessels that carry blood from your heart throughout your body. When blood pressure is too low, you may not get enough blood to your brain or to the rest of your organs. This can cause weakness, light-headedness, rapid heartbeat, and fainting. This can last for just a few seconds or for up to a few minutes. Orthostatic hypotension is usually not a serious problem. However, if it happens frequently or gets worse, it may be a sign of something more serious. °What are the causes? °This condition may be caused by: °· Sudden changes in posture, such as standing up quickly after you have been sitting or lying down. °· Blood loss. °· Loss of body fluids (dehydration). °· Heart problems. °· Hormone (endocrine) problems. °· Pregnancy. °· Severe infection. °· Lack of certain nutrients. °· Severe allergic reactions (anaphylaxis). °· Certain medicines, such as blood pressure medicine or medicines that make the body lose excess fluids (diuretics). Sometimes, this condition can be caused by not taking medicine as directed, such as taking too much of a certain medicine. °What increases the risk? °The following factors may make you more likely to develop this condition: °· Age. Risk increases as you get older. °· Conditions that affect the heart or the central nervous system. °· Taking certain medicines, such as blood pressure medicine or diuretics. °· Being pregnant. °What are the signs or symptoms? °Symptoms of this condition may include: °· Weakness. °· Light-headedness. °· Dizziness. °· Blurred vision. °· Fatigue. °· Rapid heartbeat. °· Fainting, in severe cases. °How is this diagnosed? °This condition is  diagnosed based on: °· Your medical history. °· Your symptoms. °· Your blood pressure measurement. Your health care provider will check your blood pressure when you are: °? Lying down. °? Sitting. °? Standing. °A blood pressure reading is recorded as two numbers, such as "120 over 80" (or 120/80). The first ("top") number is called the systolic pressure. It is a measure of the pressure in your arteries as your heart beats. The second ("bottom") number is called the diastolic pressure. It is a measure of the pressure in your arteries when your heart relaxes between beats. Blood pressure is measured in a unit called mm Hg. Healthy blood pressure for most adults is 120/80. If your blood pressure is below 90/60, you may be diagnosed with hypotension. °Other information or tests that may be used to diagnose orthostatic hypotension include: °· Your other vital signs, such as your heart rate and temperature. °· Blood tests. °· Tilt table test. For this test, you will be safely secured to a table that moves you from a lying position to an upright position. Your heart rhythm and blood pressure will be monitored during the test. °How is this treated? °This condition may be treated by: °· Changing your diet. This may involve eating more salt (sodium) or drinking more water. °· Taking medicines to raise your blood pressure. °· Changing the dosage of certain medicines you are taking that might be lowering your blood pressure. °· Wearing compression stockings. These stockings help to prevent blood clots and reduce swelling in your legs. °In some cases, you may need to go to the hospital for: °· Fluid replacement. This means you will   receive fluids through an IV. °· Blood replacement. This means you will receive donated blood through an IV (transfusion). °· Treating an infection or heart problems, if this applies. °· Monitoring. You may need to be monitored while medicines that you are taking wear off. °Follow these instructions  at home: °Eating and drinking ° °· Drink enough fluid to keep your urine pale yellow. °· Eat a healthy diet, and follow instructions from your health care provider about eating or drinking restrictions. A healthy diet includes: °? Fresh fruits and vegetables. °? Whole grains. °? Lean meats. °? Low-fat dairy products. °· Eat extra salt only as directed. Do not add extra salt to your diet unless your health care provider told you to do that. °· Eat frequent, small meals. °· Avoid standing up suddenly after eating. °Medicines °· Take over-the-counter and prescription medicines only as told by your health care provider. °? Follow instructions from your health care provider about changing the dosage of your current medicines, if this applies. °? Do not stop or adjust any of your medicines on your own. °General instructions ° °· Wear compression stockings as told by your health care provider. °· Get up slowly from lying down or sitting positions. This gives your blood pressure a chance to adjust. °· Avoid hot showers and excessive heat as directed by your health care provider. °· Return to your normal activities as told by your health care provider. Ask your health care provider what activities are safe for you. °· Do not use any products that contain nicotine or tobacco, such as cigarettes, e-cigarettes, and chewing tobacco. If you need help quitting, ask your health care provider. °· Keep all follow-up visits as told by your health care provider. This is important. °Contact a health care provider if you: °· Vomit. °· Have diarrhea. °· Have a fever for more than 2-3 days. °· Feel more thirsty than usual. °· Feel weak and tired. °Get help right away if you: °· Have chest pain. °· Have a fast or irregular heartbeat. °· Develop numbness in any part of your body. °· Cannot move your arms or your legs. °· Have trouble speaking. °· Become sweaty or feel light-headed. °· Faint. °· Feel short of breath. °· Have trouble staying  awake. °· Feel confused. °Summary °· Orthostatic hypotension is a sudden drop in blood pressure that happens when you quickly change positions. °· Orthostatic hypotension is usually not a serious problem. °· It is diagnosed by having your blood pressure taken lying down, sitting, and then standing. °· It may be treated by changing your diet or adjusting your medicines. °This information is not intended to replace advice given to you by your health care provider. Make sure you discuss any questions you have with your health care provider. °Document Released: 03/01/2002 Document Revised: 09/04/2017 Document Reviewed: 09/04/2017 °Elsevier Patient Education © 2020 [employer] Inc[employer] ° °

## 2018-09-18 NOTE — Assessment & Plan Note (Signed)
Resolved now with the increase in Requip dose.

## 2018-09-18 NOTE — Assessment & Plan Note (Signed)
We discussed some ways to treat nicotine addiction. I have recommended Chantix again, she will think about it. She does have a bit of shortness of breath, normal exam, normal pulse ox, adding a chest x-ray. We will never fully be able to improve her shortness of breath until she stop smoking. Follow this up with PCP.

## 2018-09-18 NOTE — Assessment & Plan Note (Addendum)
History of palpitations, 72-hour event monitor showed no arrhythmias. Echocardiogram is unremarkable. We will continue Bystolic at the current dose. She does have some orthostasis, I told her that this can be normal as we age, and that she should liberalize her salt intake considering her well-controlled blood pressure. She can follow this up with her PCP. We will not be augmenting her Bystolic dose.

## 2018-09-18 NOTE — Progress Notes (Signed)
Subjective:    CC: Multiple issues  HPI: Palpitations: Anne Mcdonald has a history of paroxysmal supraventricular tachycardia, we recently obtained a 72-hour Holter monitor, she had no episodes of SVT, V. tach, had a couple of skipped beats but nothing concerning.  She reported symptoms throughout the entire monitoring session.  She has an unremarkable echocardiogram.  We switched to Bystolic from metoprolol to help bolster her blood pressure, she feels as though she continues to have palpitations but now they only occur when going from bending or squatting to standing.  Restless leg syndrome: Resolved with the increase in dose of Requip.  Shortness of breath: She is a smoker, mild baseline shortness of breath.  I reviewed the past medical history, family history, social history, surgical history, and allergies today and no changes were needed.  Please see the problem list section below in epic for further details.  Past Medical History: Past Medical History:  Diagnosis Date  . Fibroid uterus   . PUD (peptic ulcer disease)    Past Surgical History: Past Surgical History:  Procedure Laterality Date  . CESAREAN SECTION    . CHOLECYSTECTOMY     Social History: Social History   Socioeconomic History  . Marital status: Married    Spouse name: Not on file  . Number of children: Not on file  . Years of education: Not on file  . Highest education level: Not on file  Occupational History  . Not on file  Social Needs  . Financial resource strain: Not on file  . Food insecurity    Worry: Not on file    Inability: Not on file  . Transportation needs    Medical: Not on file    Non-medical: Not on file  Tobacco Use  . Smoking status: Current Every Day Smoker    Packs/day: 1.00    Types: Cigarettes  . Smokeless tobacco: Never Used  Substance and Sexual Activity  . Alcohol use: No  . Drug use: No  . Sexual activity: Not Currently    Birth control/protection: None, Surgical   Lifestyle  . Physical activity    Days per week: Not on file    Minutes per session: Not on file  . Stress: Not on file  Relationships  . Social Herbalist on phone: Not on file    Gets together: Not on file    Attends religious service: Not on file    Active member of club or organization: Not on file    Attends meetings of clubs or organizations: Not on file    Relationship status: Not on file  Other Topics Concern  . Not on file  Social History Narrative  . Not on file   Family History: No family history on file. Allergies: Allergies  Allergen Reactions  . Other Rash  . Trulance [Plecanatide] Other (See Comments)    Abdominal pain   Medications: See med rec.  Review of Systems: No fevers, chills, night sweats, weight loss, chest pain, or shortness of breath.   Objective:    General: Well Developed, well nourished, and in no acute distress.  Neuro: Alert and oriented x3, extra-ocular muscles intact, sensation grossly intact.  HEENT: Normocephalic, atraumatic, pupils equal round reactive to light, neck supple, no masses, no lymphadenopathy, thyroid nonpalpable.  Skin: Warm and dry, no rashes. Cardiac: Regular rate and rhythm, no murmurs rubs or gallops, no lower extremity edema.  Respiratory: Clear to auscultation bilaterally. Not using accessory muscles, speaking in full sentences.  Impression and Recommendations:    Paroxysmal SVT (supraventricular tachycardia) History of palpitations, 72-hour event monitor showed no arrhythmias. Echocardiogram is unremarkable. We will continue Bystolic at the current dose. She does have some orthostasis, I told her that this can be normal as we age, and that she should liberalize her salt intake considering her well-controlled blood pressure. She can follow this up with her PCP. We will not be augmenting her Bystolic dose.  RLS (restless legs syndrome) Resolved now with the increase in Requip dose.  CIGARETTE SMOKER  We discussed some ways to treat nicotine addiction. I have recommended Chantix again, she will think about it. She does have a bit of shortness of breath, normal exam, normal pulse ox, adding a chest x-ray. We will never fully be able to improve her shortness of breath until she stop smoking. Follow this up with PCP.   ___________________________________________ Gwen Her. Dianah Field, M.D., ABFM., CAQSM. Primary Care and Sports Medicine [facility]  Adjunct Professor of [employer]

## 2018-10-17 ENCOUNTER — Other Ambulatory Visit: Payer: Self-pay | Admitting: Family Medicine

## 2018-10-19 ENCOUNTER — Ambulatory Visit: Payer: BC Managed Care – PPO | Admitting: Family Medicine

## 2018-10-19 ENCOUNTER — Encounter: Payer: Self-pay | Admitting: Family Medicine

## 2018-10-19 ENCOUNTER — Other Ambulatory Visit: Payer: Self-pay

## 2018-10-19 VITALS — BP 120/65 | HR 67 | Ht 64.0 in | Wt 149.0 lb

## 2018-10-19 DIAGNOSIS — B351 Tinea unguium: Secondary | ICD-10-CM

## 2018-10-19 DIAGNOSIS — Z5181 Encounter for therapeutic drug level monitoring: Secondary | ICD-10-CM

## 2018-10-19 DIAGNOSIS — I471 Supraventricular tachycardia: Secondary | ICD-10-CM

## 2018-10-19 MED ORDER — TERBINAFINE HCL 250 MG PO TABS: 250.0000 mg | ORAL_TABLET | Freq: Every day | ORAL | 1 refills | Status: DC

## 2018-10-19 NOTE — Progress Notes (Signed)
Acute Office Visit  Subjective:    Patient ID: Anne Mcdonald, female    DOB: 1960/07/24, 58 y.o.   MRN: [id number]  Chief Complaint  Patient presents with  . Hypotension    doing well hasn't had any issues  . yellow toenails    she reports that her toenails have been turning yellow for years. and the nails are thick    HPI Patient is in today for follow-up hypotension.  She had actually had a syncopal episode back in May and was seen by 1 of my partners.  She had a prior history of SVT.  After additional work-up we switched her to [facility] and she has been doing well.  She is also been purposely make sure during that she is hydrating especially if she is out in the heat and that has seemed to resolve her symptoms she is feeling much better.  She also wanted to ask about her toenails.  She says that for a couple of years she has had some thickened toenails particularly on the right foot her great toenail on her fifth toenail.  Now she starting to notice some darkening discoloration on the great toenail on her opposite foot.  She says she has been embarrassed by them.  Past Medical History:  Diagnosis Date  . Fibroid uterus   . PUD (peptic ulcer disease)     Past Surgical History:  Procedure Laterality Date  . CESAREAN SECTION    . CHOLECYSTECTOMY      No family history on file.  Social History   Socioeconomic History  . Marital status: Married    Spouse name: Not on file  . Number of children: Not on file  . Years of education: Not on file  . Highest education level: Not on file  Occupational History  . Not on file  Social Needs  . Financial resource strain: Not on file  . Food insecurity    Worry: Not on file    Inability: Not on file  . Transportation needs    Medical: Not on file    Non-medical: Not on file  Tobacco Use  . Smoking status: Current Every Day Smoker    Packs/day: 1.00    Types: Cigarettes  . Smokeless tobacco: Never Used  Substance and  Sexual Activity  . Alcohol use: No  . Drug use: No  . Sexual activity: Not Currently    Birth control/protection: None, Surgical  Lifestyle  . Physical activity    Days per week: Not on file    Minutes per session: Not on file  . Stress: Not on file  Relationships  . Social Herbalist on phone: Not on file    Gets together: Not on file    Attends religious service: Not on file    Active member of club or organization: Not on file    Attends meetings of clubs or organizations: Not on file    Relationship status: Not on file  . Intimate partner violence    Fear of current or ex partner: Not on file    Emotionally abused: Not on file    Physically abused: Not on file    Forced sexual activity: Not on file  Other Topics Concern  . Not on file  Social History Narrative  . Not on file    Outpatient Medications Prior to Visit  Medication Sig Dispense Refill  . Ascorbic Acid (VITAMIN C) 1000 MG tablet Take by mouth.    Marland Kitchen  Betamethasone Valerate 0.12 % foam Apply 1 application topically daily as needed. Stop after 2 weeks of use. 100 g 1  . CALCIUM-MAGNESIUM-ZINC PO Take by mouth.    . Cholecalciferol (VITAMIN D3) 5000 units CAPS Take by mouth.    . clobetasol ointment (TEMOVATE) 0.05 % Apply to palms of hands QD prn. 45 g 0  . DEXILANT 60 MG capsule TAKE ONE CAPSULE (60 MG DOSE) BY MOUTH 30 (THIRTY) MINUTES BEFORE BREAKFAST.    . Galcanezumab-gnlm (EMGALITY) 120 MG/ML SOAJ Inject 240 mg into the skin as directed AND 120 mg every 30 (thirty) days. Inj 240mg  once then 120mg  monthly. 1 pen 11  . hydrOXYzine (VISTARIL) 25 MG capsule TAKE 1 TWICE DAILY AS NEEDED ANXIETY  0  . lamoTRIgine (LAMICTAL) 200 MG tablet Take 200 mg by mouth daily.    . Multiple Vitamin (THERA) TABS Take by mouth.    . nebivolol (BYSTOLIC) 2.5 MG tablet Take 1 tablet (2.5 mg total) by mouth daily. 30 tablet 3  . Omega-3 Fatty Acids (FISH OIL) 1000 MG CAPS Take by mouth.    . rizatriptan (MAXALT) 10 MG  tablet Take 1 tablet (10 mg total) by mouth as needed for migraine. May repeat in 2 hours if needed 12 tablet 11  . rOPINIRole (REQUIP) 1 MG tablet Take 1 tablet (1 mg total) by mouth at bedtime. 30 tablet 3  . traZODone (DESYREL) 150 MG tablet TAKE 1 TABLET BY MOUTH EVERYDAY AT BEDTIME    . VITAMIN E PO Take by mouth.    . linaclotide (LINZESS) 145 MCG CAPS capsule Take 1 capsule (145 mcg total) by mouth daily before breakfast. 30 capsule 3   No facility-administered medications prior to visit.     Allergies  Allergen Reactions  . Other Rash  . Trulance [Plecanatide] Other (See Comments)    Abdominal pain    ROS     Objective:    Physical Exam  Constitutional: She is oriented to person, place, and time. She appears well-developed and well-nourished.  HENT:  Head: Normocephalic and atraumatic.  Cardiovascular: Normal rate, regular rhythm and normal heart sounds.  Pulmonary/Chest: Effort normal and breath sounds normal.  Musculoskeletal:     Comments: On her great toenail and fifth toenail on her right foot they are quite thick and almost gray looking.  And she started to get some black discoloration and thickening along the border of the great toenail on her left foot.  Neurological: She is alert and oriented to person, place, and time.  Skin: Skin is warm and dry.  Psychiatric: She has a normal mood and affect. Her behavior is normal.    BP 120/65   Pulse 67   Ht 5\' 4"  (1.626 m)   Wt 149 lb (67.6 kg)   LMP 07/17/2016   SpO2 100%   BMI 25.58 kg/m  Wt Readings from Last 3 Encounters:  10/19/18 149 lb (67.6 kg)  09/18/18 147 lb (66.7 kg)  08/21/18 148 lb (67.1 kg)    Health Maintenance Due  Topic Date Due  . Hepatitis C Screening  01[birth year]07[birth year]  . HIV Screening  11/05/1974    There are no preventive care reminders to display for this patient.   Lab Results  Component Value Date   TSH 2.45 07/21/2018   Lab Results  Component Value Date   WBC 9.2 07/27/2018    HGB 13.6 07/27/2018   HCT 38.3 07/27/2018   MCV 93.4 07/27/2018   PLT 342 07/27/2018  Lab Results  Component Value Date   NA 138 07/27/2018   K 3.9 07/27/2018   CO2 26 07/27/2018   GLUCOSE 128 (H) 07/27/2018   BUN 14 07/27/2018   CREATININE 0.81 07/27/2018   BILITOT 0.6 07/27/2018   ALKPHOS 86 11/26/2016   AST 17 07/27/2018   ALT 12 07/27/2018   PROT 6.3 07/27/2018   ALBUMIN 4.4 11/26/2016   CALCIUM 9.0 07/27/2018   Lab Results  Component Value Date   CHOL 202 (H) 07/21/2018   Lab Results  Component Value Date   HDL 58 07/21/2018   Lab Results  Component Value Date   LDLCALC 124 (H) 07/21/2018   Lab Results  Component Value Date   TRIG 98 07/21/2018   Lab Results  Component Value Date   CHOLHDL 3.5 07/21/2018   No results found for: HGBA1C     Assessment & Plan:   Problem List Items Addressed This Visit      Cardiovascular and Mediastinum   Paroxysmal SVT (supraventricular tachycardia) ([place])    Other Visit Diagnoses    Medication monitoring encounter    -  Primary   Relevant Orders   COMPLETE METABOLIC PANEL WITH GFR   Onychomycosis       Relevant Medications   terbinafine (LAMISIL) 250 MG tablet   Other Relevant Orders   COMPLETE METABOLIC PANEL WITH GFR     Hypotension-she is actually feeling much better.  She is also tolerating the Bystolic well.  Encourage hydration when she is out in the heat.  Paroxysmal SVT.  Currently well controlled on beta-blocker.  Just encourage hydration.  Dystrophic toenails most consistent with onychomycosis-we will treat with oral Lamisil.  She had labs done this spring.  Recommend 1 month into the treatment that she get her lab work checked to evaluate her liver enzymes.  She will go at her convenience.  Did discuss that it can sometimes take up to 6 months or more to allow the new nail to grow out.  Meds ordered this encounter  Medications  . terbinafine (LAMISIL) 250 MG tablet    Sig: Take 1 tablet (250 mg  total) by mouth daily.    Dispense:  90 tablet    Refill:  1     Beatrice Lecher, MD

## 2018-11-13 ENCOUNTER — Other Ambulatory Visit: Payer: Self-pay | Admitting: Sports Medicine

## 2018-11-13 DIAGNOSIS — G2581 Restless legs syndrome: Secondary | ICD-10-CM

## 2018-11-19 LAB — COMPLETE METABOLIC PANEL WITH GFR
AG Ratio: 2.3 (calc) (ref 1.0–2.5)
ALT: 12 U/L (ref 6–29)
AST: 13 U/L (ref 10–35)
Albumin: 4.5 g/dL (ref 3.6–5.1)
Alkaline phosphatase (APISO): 81 U/L (ref 37–153)
BUN: 19 mg/dL (ref 7–25)
CO2: 24 mmol/L (ref 20–32)
Calcium: 10.3 mg/dL (ref 8.6–10.4)
Chloride: 106 mmol/L (ref 98–110)
Creat: 0.9 mg/dL (ref 0.50–1.05)
GFR, Est African American: 81 mL/min/{1.73_m2} (ref >=60)
GFR, Est Non African American: 70 mL/min/{1.73_m2} (ref >=60)
Globulin: 2 g/dL (calc) (ref 1.9–3.7)
Glucose, Bld: 115 mg/dL — ABNORMAL HIGH (ref 65–99)
Potassium: 4.4 mmol/L (ref 3.5–5.3)
Sodium: 140 mmol/L (ref 135–146)
Total Bilirubin: 0.7 mg/dL (ref 0.2–1.2)
Total Protein: 6.5 g/dL (ref 6.1–8.1)

## 2018-12-02 ENCOUNTER — Other Ambulatory Visit: Payer: Self-pay

## 2018-12-02 ENCOUNTER — Ambulatory Visit (INDEPENDENT_AMBULATORY_CARE_PROVIDER_SITE_OTHER): Payer: BC Managed Care – PPO | Admitting: Physician Assistant

## 2018-12-02 VITALS — BP 107/62 | HR 60 | Ht 64.0 in | Wt 149.0 lb

## 2018-12-02 DIAGNOSIS — R7309 Other abnormal glucose: Secondary | ICD-10-CM

## 2018-12-02 LAB — POCT GLYCOSYLATED HEMOGLOBIN (HGB A1C): Hemoglobin A1C: 5.1 % (ref 4.0–5.6)

## 2018-12-02 NOTE — Progress Notes (Signed)
A1c looks great. Normal range.

## 2018-12-02 NOTE — Progress Notes (Signed)
Pt here today for A1c due to her last glucose being elevated. Maryruth Eve, Lahoma Crocker, CMA

## 2018-12-10 ENCOUNTER — Other Ambulatory Visit: Payer: Self-pay | Admitting: *Deleted

## 2018-12-10 DIAGNOSIS — I471 Supraventricular tachycardia: Secondary | ICD-10-CM

## 2018-12-10 MED ORDER — NEBIVOLOL HCL 2.5 MG PO TABS: 2.5000 mg | ORAL_TABLET | Freq: Every day | ORAL | 3 refills | Status: DC

## 2019-01-04 ENCOUNTER — Encounter: Payer: Self-pay | Admitting: Family Medicine

## 2019-01-04 ENCOUNTER — Other Ambulatory Visit: Payer: Self-pay

## 2019-01-04 ENCOUNTER — Ambulatory Visit: Payer: BC Managed Care – PPO | Admitting: Family Medicine

## 2019-01-04 VITALS — BP 98/71 | HR 89 | Temp 98.1°F | Ht 64.0 in | Wt 145.0 lb

## 2019-01-04 DIAGNOSIS — R079 Chest pain, unspecified: Secondary | ICD-10-CM | POA: Diagnosis not present

## 2019-01-04 DIAGNOSIS — G2581 Restless legs syndrome: Secondary | ICD-10-CM | POA: Diagnosis not present

## 2019-01-04 DIAGNOSIS — F439 Reaction to severe stress, unspecified: Secondary | ICD-10-CM | POA: Diagnosis not present

## 2019-01-04 DIAGNOSIS — M5442 Lumbago with sciatica, left side: Secondary | ICD-10-CM

## 2019-01-04 MED ORDER — CYCLOBENZAPRINE HCL 10 MG PO TABS: 10.0000 mg | ORAL_TABLET | Freq: Three times a day (TID) | ORAL | 0 refills | Status: DC | PRN

## 2019-01-04 NOTE — Assessment & Plan Note (Signed)
Discussed working on core muscles for strengthening.  Also will make her a handout on McKenzie exercises.  I forgot to give it to before she left so we will mail it to her.  Did refill the cyclobenzaprine to use as needed.  Avoid excess bending and twisting of the lumbar spine.  It sounds like she is improving on her own.

## 2019-01-04 NOTE — Patient Instructions (Signed)

## 2019-01-04 NOTE — Assessment & Plan Note (Signed)
Okay to take a half a tab in the afternoon and keep her whole tab in the evening and see if this helps and she is having some symptoms in the afternoon.

## 2019-01-04 NOTE — Progress Notes (Signed)
Acute Office Visit  Subjective:    Patient ID: Anne Mcdonald, female    DOB: May 21, 1960, 58 y.o.   MRN: [id number]  Chief Complaint  Patient presents with  . Back Pain    HPI Patient is in today for   Left low back pain.  She says it really started to bother her about a week ago.  She has been spending most of her summer working outdoors in her own yard in a neighbor's yard doing some work.  She has been doing a lot of physical labor.  She does not member specific injury or trauma but she does note that she has had a prior problem with a disc issue in her low back, back in January of last year.  Given a prescription of muscle relaxer cyclobenzaprine.  She still had a couple tabs left over and took that and says it was really helpful.  Her daughter also had some Tylenol with codeine left over from her procedure and she took that for couple days as well and that seem to be helpful.  She does feel that her back is getting better.  She says initially the pain was shooting down into her left leg but that seems to have gotten better.  She is also having a lot of pain and discomfort with walking.  She says she always has a little bit of low back pain and stiffness particularly in the mornings when she first wakes up.  She also wanted to let me know that she started having some chest pain yesterday.  She said it came on and she felt like it was going all the way across her chest.  No known triggers.  It did not seem to be food related.  She says it finally went away when she went to bed.  She says when she woke up this morning she felt okay but then started getting the chest pain again but it seems to have gone away again before she came into the office today.  She denies any shortness of breath.  She does have frequent palpitations but says the Bystolic has been helpful.  Reports she is also been under less stress recently.  3 of her adult children are currently living in the home and she and her  husband have been disagreeing on some things particularly in regards to the children.  In fact last year she said she moved out from his 3 months and then eventually moved back in.  She also reports that her ulcer in her stomach also started flaring up about 3 to 4 weeks ago.  She says she was just sitting down watching TV when suddenly she started having epigastric pain.  Actually ended up calling and making point with GI.  She says that they gave her some type of powder medication that works similar to Maalox and also gave her medication to take if her stomach starts hurting more severely.  She says it has gradually started to feel a little bit better and she is scheduled for CT of the abdomen later this afternoon.  In regards to her restless leg syndrome-she feels like the ropinirole has been helpful she has worked up to 1 mg.  She says the only thing that bothers her sometimes in the afternoons if she sitting down and resting she will start to feel the sensations again.  And she wants to know there is anything she can do in the afternoons to help.  Past Medical History:  Diagnosis Date  . Fibroid uterus   . PUD (peptic ulcer disease)     Past Surgical History:  Procedure Laterality Date  . CESAREAN SECTION    . CHOLECYSTECTOMY      No family history on file.  Social History   Socioeconomic History  . Marital status: Married    Spouse name: Not on file  . Number of children: Not on file  . Years of education: Not on file  . Highest education level: Not on file  Occupational History  . Not on file  Social Needs  . Financial resource strain: Not on file  . Food insecurity    Worry: Not on file    Inability: Not on file  . Transportation needs    Medical: Not on file    Non-medical: Not on file  Tobacco Use  . Smoking status: Current Every Day Smoker    Packs/day: 1.00    Types: Cigarettes  . Smokeless tobacco: Never Used  Substance and Sexual Activity  . Alcohol use: No   . Drug use: No  . Sexual activity: Not Currently    Birth control/protection: None, Surgical  Lifestyle  . Physical activity    Days per week: Not on file    Minutes per session: Not on file  . Stress: Not on file  Relationships  . Social Herbalist on phone: Not on file    Gets together: Not on file    Attends religious service: Not on file    Active member of club or organization: Not on file    Attends meetings of clubs or organizations: Not on file    Relationship status: Not on file  . Intimate partner violence    Fear of current or ex partner: Not on file    Emotionally abused: Not on file    Physically abused: Not on file    Forced sexual activity: Not on file  Other Topics Concern  . Not on file  Social History Narrative  . Not on file    Outpatient Medications Prior to Visit  Medication Sig Dispense Refill  . Ascorbic Acid (VITAMIN C) 1000 MG tablet Take by mouth.    . Betamethasone Valerate 0.12 % foam Apply 1 application topically daily as needed. Stop after 2 weeks of use. 100 g 1  . CALCIUM-MAGNESIUM-ZINC PO Take by mouth.    . Cholecalciferol (VITAMIN D3) 5000 units CAPS Take by mouth.    . clobetasol ointment (TEMOVATE) 0.05 % Apply to palms of hands QD prn. 45 g 0  . DEXILANT 60 MG capsule TAKE ONE CAPSULE (60 MG DOSE) BY MOUTH 30 (THIRTY) MINUTES BEFORE BREAKFAST.    . Galcanezumab-gnlm (EMGALITY) 120 MG/ML SOAJ Inject 240 mg into the skin as directed AND 120 mg every 30 (thirty) days. Inj 240mg  once then 120mg  monthly. 1 pen 11  . hydrOXYzine (VISTARIL) 25 MG capsule TAKE 1 TWICE DAILY AS NEEDED ANXIETY  0  . lamoTRIgine (LAMICTAL) 200 MG tablet Take 200 mg by mouth daily.    Marland Kitchen LINZESS 145 MCG CAPS capsule TAKE 1 CAPSULE (145 MCG TOTAL) BY MOUTH DAILY BEFORE BREAKFAST. 30 capsule 11  . Multiple Vitamin (THERA) TABS Take by mouth.    . nebivolol (BYSTOLIC) 2.5 MG tablet Take 1 tablet (2.5 mg total) by mouth daily. 30 tablet 3  . Omega-3 Fatty  Acids (FISH OIL) 1000 MG CAPS Take by mouth.    . rizatriptan (MAXALT) 10 MG tablet Take 1  tablet (10 mg total) by mouth as needed for migraine. May repeat in 2 hours if needed 12 tablet 11  . rOPINIRole (REQUIP) 1 MG tablet TAKE 1 TABLET BY MOUTH EVERYDAY AT BEDTIME 90 tablet 1  . terbinafine (LAMISIL) 250 MG tablet Take 1 tablet (250 mg total) by mouth daily. 90 tablet 1  . traZODone (DESYREL) 150 MG tablet TAKE 1 TABLET BY MOUTH EVERYDAY AT BEDTIME    . VITAMIN E PO Take by mouth.     No facility-administered medications prior to visit.     Allergies  Allergen Reactions  . Other Rash  . Trulance [Plecanatide] Other (See Comments)    Abdominal pain    ROS     Objective:    Physical Exam  Constitutional: She is oriented to person, place, and time. She appears well-developed and well-nourished.  HENT:  Head: Normocephalic and atraumatic.  Cardiovascular: Normal rate, regular rhythm and normal heart sounds.  Pulmonary/Chest: Effort normal and breath sounds normal.  Musculoskeletal:     Comments: Nontender over the lumbar spine which is a little bit tender just left of the spine in the upper lumbar region.  Nontender over the SI joints.  Negative straight leg raise.  Hip, knee, ankle strength is 5-5 bilaterally.  Patellar reflexes 1+ bilaterally  Neurological: She is alert and oriented to person, place, and time.  Skin: Skin is warm and dry.  Psychiatric: She has a normal mood and affect. Her behavior is normal.    BP 98/71   Pulse 89   Temp 98.1 F (36.7 C) (Oral)   Ht 5\' 4"  (1.626 m)   Wt 145 lb (65.8 kg)   LMP 07/17/2016   SpO2 100%   BMI 24.89 kg/m  Wt Readings from Last 3 Encounters:  01/04/19 145 lb (65.8 kg)  12/02/18 149 lb (67.6 kg)  10/19/18 149 lb (67.6 kg)    There are no preventive care reminders to display for this patient.  There are no preventive care reminders to display for this patient.   Lab Results  Component Value Date   TSH 2.45  07/21/2018   Lab Results  Component Value Date   WBC 9.2 07/27/2018   HGB 13.6 07/27/2018   HCT 38.3 07/27/2018   MCV 93.4 07/27/2018   PLT 342 07/27/2018   Lab Results  Component Value Date   NA 140 11/19/2018   K 4.4 11/19/2018   CO2 24 11/19/2018   GLUCOSE 115 (H) 11/19/2018   BUN 19 11/19/2018   CREATININE 0.90 11/19/2018   BILITOT 0.7 11/19/2018   ALKPHOS 86 11/26/2016   AST 13 11/19/2018   ALT 12 11/19/2018   PROT 6.5 11/19/2018   ALBUMIN 4.4 11/26/2016   CALCIUM 10.3 11/19/2018   Lab Results  Component Value Date   CHOL 202 (H) 07/21/2018   Lab Results  Component Value Date   HDL 58 07/21/2018   Lab Results  Component Value Date   LDLCALC 124 (H) 07/21/2018   Lab Results  Component Value Date   TRIG 98 07/21/2018   Lab Results  Component Value Date   CHOLHDL 3.5 07/21/2018   Lab Results  Component Value Date   HGBA1C 5.1 12/02/2018       Assessment & Plan:   Problem List Items Addressed This Visit      Other   RLS (restless legs syndrome)    Okay to take a half a tab in the afternoon and keep her whole tab in the evening  and see if this helps and she is having some symptoms in the afternoon.      Acute left-sided low back pain with left-sided sciatica    Discussed working on core muscles for strengthening.  Also will make her a handout on McKenzie exercises.  I forgot to give it to before she left so we will mail it to her.  Did refill the cyclobenzaprine to use as needed.  Avoid excess bending and twisting of the lumbar spine.  It sounds like she is improving on her own.      Relevant Medications   cyclobenzaprine (FLEXERIL) 10 MG tablet    Other Visit Diagnoses    Chest pain, unspecified type    -  Primary   Relevant Orders   EKG 12-Lead   Stress at home         Atypical chest pain-EKG today shows rate of 66 bpm, normal sinus rhythm no acute ST-T wave changes.  This is very reassuring.  Being that the pain was persistent all day long  it is less likely to be cardiac.  Plus it sounds like she has been under a lot of stress recently.  She has had some stress at home-could be contributing to some of her chest pain.  She is not currently on any prescription medication.    Meds ordered this encounter  Medications  . cyclobenzaprine (FLEXERIL) 10 MG tablet    Sig: Take 1 tablet (10 mg total) by mouth 3 (three) times daily as needed for muscle spasms.    Dispense:  30 tablet    Refill:  0     Beatrice Lecher, MD

## 2019-02-25 ENCOUNTER — Encounter: Payer: Self-pay | Admitting: General Practice

## 2019-02-25 NOTE — Progress Notes (Signed)
Received fax [employer] requires prior auth. Will route to [facility] office

## 2019-02-26 ENCOUNTER — Other Ambulatory Visit: Payer: BLUE CROSS/BLUE SHIELD

## 2019-03-05 DIAGNOSIS — K581 Irritable bowel syndrome with constipation: Secondary | ICD-10-CM | POA: Insufficient documentation

## 2019-03-10 ENCOUNTER — Ambulatory Visit
Admission: RE | Admit: 2019-03-10 | Discharge: 2019-03-10 | Disposition: A | Discharge status: Home / Self Care | Payer: Managed Care, Other (non HMO) | Source: Ambulatory Visit | Attending: Family Medicine | Admitting: Family Medicine

## 2019-03-10 ENCOUNTER — Other Ambulatory Visit: Payer: Self-pay | Admitting: Family Medicine

## 2019-03-10 ENCOUNTER — Other Ambulatory Visit: Payer: Self-pay

## 2019-03-10 DIAGNOSIS — N6320 Unspecified lump in the left breast, unspecified quadrant: Secondary | ICD-10-CM

## 2019-03-22 ENCOUNTER — Telehealth: Payer: Self-pay | Admitting: Family Medicine

## 2019-03-22 NOTE — Telephone Encounter (Signed)
Received fax from Covermymeds that Bystolic requires a PA. Information has been sent to the insurance company. Awaiting determination.

## 2019-03-23 NOTE — Telephone Encounter (Signed)
Approvedon December 28--Bystolic [id number];Review Type:Prior Auth;Coverage Start Date:03/22/2019;Coverage End Date:03/21/2020; Pharmacy aware.

## 2019-03-25 ENCOUNTER — Telehealth: Payer: Self-pay | Admitting: *Deleted

## 2019-03-25 NOTE — Telephone Encounter (Signed)
[employer] approved per covermymeds 03/09/2019-03/11/2020

## 2019-03-30 ENCOUNTER — Encounter: Payer: Self-pay | Admitting: *Deleted

## 2019-04-09 ENCOUNTER — Telehealth: Payer: Self-pay | Admitting: Family Medicine

## 2019-04-09 DIAGNOSIS — K5909 Other constipation: Secondary | ICD-10-CM

## 2019-04-09 NOTE — Telephone Encounter (Signed)
Received fax from Covermymeds that Linzess requires a PA. Information has been sent to the insurance company. Awaiting determination.   

## 2019-04-14 ENCOUNTER — Other Ambulatory Visit: Payer: Self-pay | Admitting: Family Medicine

## 2019-04-14 DIAGNOSIS — I471 Supraventricular tachycardia: Secondary | ICD-10-CM

## 2019-04-16 ENCOUNTER — Other Ambulatory Visit: Payer: Self-pay | Admitting: Family Medicine

## 2019-04-17 ENCOUNTER — Other Ambulatory Visit: Payer: Self-pay | Admitting: Family Medicine

## 2019-04-17 DIAGNOSIS — G2581 Restless legs syndrome: Secondary | ICD-10-CM

## 2019-04-20 ENCOUNTER — Other Ambulatory Visit: Payer: Self-pay | Admitting: Family Medicine

## 2019-04-20 DIAGNOSIS — I471 Supraventricular tachycardia: Secondary | ICD-10-CM

## 2019-04-23 NOTE — Telephone Encounter (Signed)
Received a fax from insurance that [place] has been denied. I do not see that this patient has been on Amitizia before. I have left a message to see if she has tried and failed Amitiza. Waiting on a response.

## 2019-04-28 ENCOUNTER — Other Ambulatory Visit: Payer: Self-pay | Admitting: Family Medicine

## 2019-04-28 DIAGNOSIS — I471 Supraventricular tachycardia: Secondary | ICD-10-CM

## 2019-05-03 MED ORDER — LUBIPROSTONE 24 MCG PO CAPS: 24.0000 ug | ORAL_CAPSULE | Freq: Two times a day (BID) | ORAL | 1 refills | Status: DC

## 2019-05-03 NOTE — Telephone Encounter (Signed)
Please send in Amitiza to [facility]. Please advise.

## 2019-05-03 NOTE — Telephone Encounter (Signed)
New rx sent for Amitiza

## 2019-05-05 NOTE — Telephone Encounter (Signed)
Patient advised.

## 2019-06-08 LAB — HM COLONOSCOPY

## 2019-06-11 ENCOUNTER — Encounter: Payer: Self-pay | Admitting: Family Medicine

## 2019-06-30 ENCOUNTER — Ambulatory Visit: Payer: Managed Care, Other (non HMO) | Admitting: Sports Medicine

## 2019-06-30 ENCOUNTER — Other Ambulatory Visit: Payer: Self-pay

## 2019-06-30 ENCOUNTER — Encounter: Payer: Self-pay | Admitting: Sports Medicine

## 2019-06-30 DIAGNOSIS — I471 Supraventricular tachycardia, unspecified: Secondary | ICD-10-CM

## 2019-06-30 NOTE — Progress Notes (Signed)
    Procedures performed today:    None.  Independent interpretation of notes and tests performed by another provider:   None.  Brief History, Exam, Impression, and Recommendations:    Paroxysmal SVT (supraventricular tachycardia) This is a pleasant 59 year old female, she has a history of paroxysmal SVT seen on event recorder back in 2010, since then she is done well on Bystolic. She did have a recent 72-hour Holter monitor in the summertime of 2020, she reported palpitations through the entire 72-hour period but the Holter monitor only picked up a few extrasystoles, no SVT or V. Tach. We advised hyperhydration, liberalization of salt considering her blood pressure was a little low and she was having some episodes of orthostasis. Symptoms improved considerably. More recently she forgot a few doses of Bystolic and has noted palpitations, she started taking her medication again, and symptoms resolved. She is here more for reassurance, her vitals are stable, she is not orthostatic, no chest pain, shortness of breath, palpitations, auscultation is normal, I think I can give her a clean bill of health and she can return to see me [place] needed for this complaint.    ___________________________________________ Gwen Her. Dianah Field, M.D., ABFM., CAQSM. Primary Care and [place] Instructor of [place] [facility]

## 2019-06-30 NOTE — Assessment & Plan Note (Signed)
This is a pleasant 59 year old female, she has a history of paroxysmal SVT seen on event recorder back in 2010, since then she is done well on Bystolic. She did have a recent 72-hour Holter monitor in the summertime of 2020, she reported palpitations through the entire 72-hour period but the Holter monitor only picked up a few extrasystoles, no SVT or V. Tach. We advised hyperhydration, liberalization of salt considering her blood pressure was a little low and she was having some episodes of orthostasis. Symptoms improved considerably. More recently she forgot a few doses of Bystolic and has noted palpitations, she started taking her medication again, and symptoms resolved. She is here more for reassurance, her vitals are stable, she is not orthostatic, no chest pain, shortness of breath, palpitations, auscultation is normal, I think I can give her a clean bill of health and she can return to see me [place] needed for this complaint.

## 2019-07-15 ENCOUNTER — Encounter: Payer: Self-pay | Admitting: Radiology

## 2019-07-21 ENCOUNTER — Other Ambulatory Visit: Payer: Self-pay | Admitting: *Deleted

## 2019-07-21 MED ORDER — RIZATRIPTAN BENZOATE 10 MG PO TABS: 10.0000 mg | ORAL_TABLET | ORAL | 2 refills | Status: DC | PRN

## 2019-08-12 ENCOUNTER — Other Ambulatory Visit: Payer: Self-pay | Admitting: Family Medicine

## 2019-08-12 DIAGNOSIS — I471 Supraventricular tachycardia: Secondary | ICD-10-CM

## 2019-08-13 ENCOUNTER — Encounter: Payer: Managed Care, Other (non HMO) | Admitting: Physician Assistant

## 2019-08-27 ENCOUNTER — Encounter: Payer: Self-pay | Admitting: Physician Assistant

## 2019-08-27 ENCOUNTER — Ambulatory Visit: Payer: Managed Care, Other (non HMO) | Admitting: Physician Assistant

## 2019-08-27 ENCOUNTER — Other Ambulatory Visit: Payer: Self-pay

## 2019-08-27 VITALS — BP 127/74 | Wt 142.0 lb

## 2019-08-27 DIAGNOSIS — G43009 Migraine without aura, not intractable, without status migrainosus: Secondary | ICD-10-CM

## 2019-08-27 MED ORDER — EMGALITY 120 MG/ML ~~LOC~~ SOAJ: SUBCUTANEOUS | 11 refills | Status: DC

## 2019-08-27 MED ORDER — RIZATRIPTAN BENZOATE 10 MG PO TABS: 10.0000 mg | ORAL_TABLET | ORAL | 11 refills | Status: DC | PRN

## 2019-08-27 NOTE — Progress Notes (Signed)
History:  Anne Mcdonald is a 59 y.o. [id number] who presents to clinic today for headache follow up.  She states her migraines are much better on Emgality.  They have reduced significantly in frequency.  When she does have a migraine, her maxalt works well every time. She wants to continue on her current regimen.    Past Medical History:  Diagnosis Date  . Fibroid uterus   . PUD (peptic ulcer disease)     Social History   Socioeconomic History  . Marital status: Married    Spouse name: Not on file  . Number of children: Not on file  . Years of education: Not on file  . Highest education level: Not on file  Occupational History  . Not on file  Tobacco Use  . Smoking status: Current Every Day Smoker    Packs/day: 1.00    Types: Cigarettes  . Smokeless tobacco: Never Used  Substance and Sexual Activity  . Alcohol use: No  . Drug use: No  . Sexual activity: Not Currently    Birth control/protection: None, Surgical  Other Topics Concern  . Not on file  Social History Narrative  . Not on file   Social Determinants of Health   Financial Resource Strain:   . Difficulty of Paying Living Expenses:   Food Insecurity:   . Worried About Charity fundraiser in the Last Year:   . Arboriculturist in the Last Year:   Transportation Needs:   . Film/video editor (Medical):   Marland Kitchen Lack of Transportation (Non-Medical):   Physical Activity:   . Days of Exercise per Week:   . Minutes of Exercise per Session:   Stress:   . Feeling of Stress :   Social Connections:   . Frequency of Communication with Friends and Family:   . Frequency of Social Gatherings with Friends and Family:   . Attends Religious Services:   . Active Member of Clubs or Organizations:   . Attends Archivist Meetings:   Marland Kitchen Marital Status:   Intimate Partner Violence:   . Fear of Current or Ex-Partner:   . Emotionally Abused:   Marland Kitchen Physically Abused:   . Sexually Abused:     No family history on  file.  Allergies  Allergen Reactions  . Other Rash  . Trulance [Plecanatide] Other (See Comments)    Abdominal pain    Current Outpatient Medications on File Prior to Visit  Medication Sig Dispense Refill  . Ascorbic Acid (VITAMIN C) 1000 MG tablet Take by mouth.    . Betamethasone Valerate 0.12 % foam Apply 1 application topically daily as needed. Stop after 2 weeks of use. 100 g 1  . BYSTOLIC 2.5 MG tablet TAKE 1 TABLET BY MOUTH EVERY DAY 30 tablet 3  . CALCIUM-MAGNESIUM-ZINC PO Take by mouth.    . Cholecalciferol (VITAMIN D3) 5000 units CAPS Take by mouth.    . lamoTRIgine (LAMICTAL) 200 MG tablet Take 200 mg by mouth daily.    . Multiple Vitamin (THERA) TABS Take by mouth.    . Omega-3 Fatty Acids (FISH OIL) 1000 MG CAPS Take by mouth.    . rizatriptan (MAXALT) 10 MG tablet Take 1 tablet (10 mg total) by mouth as needed for migraine. May repeat in 2 hours if needed 12 tablet 2  . rOPINIRole (REQUIP) 1 MG tablet TAKE 1 TABLET BY MOUTH EVERYDAY AT BEDTIME 90 tablet 1  . terbinafine (LAMISIL) 250 MG tablet TAKE 1 TABLET  BY MOUTH EVERY DAY 90 tablet 1  . traZODone (DESYREL) 150 MG tablet TAKE 1 TABLET BY MOUTH EVERYDAY AT BEDTIME    . VITAMIN E PO Take by mouth.    . clobetasol ointment (TEMOVATE) 0.05 % Apply to palms of hands QD prn. 45 g 0  . cyclobenzaprine (FLEXERIL) 10 MG tablet Take 1 tablet (10 mg total) by mouth 3 (three) times daily as needed for muscle spasms. 30 tablet 0  . Galcanezumab-gnlm (EMGALITY) 120 MG/ML SOAJ Inject 240 mg into the skin as directed AND 120 mg every 30 (thirty) days. Inj 240mg  once then 120mg  monthly. 1 pen 11  . hydrOXYzine (VISTARIL) 25 MG capsule TAKE 1 TWICE DAILY AS NEEDED ANXIETY  0   No current facility-administered medications on file prior to visit.     Review of Systems:  All pertinent positive/negative included in HPI, all other review of systems are negative   Objective:  Physical Exam BP 127/74   Wt 142 lb (64.4 kg)   LMP  07/17/2016   BMI 24.37 kg/m  CONSTITUTIONAL: Well-developed, well-nourished female in no acute distress.  EYES: EOM intact ENT: Normocephalic CARDIOVASCULAR: Regular rate  RESPIRATORY: Normal rate.  MUSCULOSKELETAL: Normal ROM SKIN: Warm, dry without erythema  NEUROLOGICAL: Alert, oriented, CN II-XII grossly intact, Appropriate balance PSYCH: Normal behavior, mood   Assessment & Plan:  Assessment: 1. Migraine without aura and without status migrainosus, not intractable   improved   Plan: Continue monthly emgality.  Savings card and samples provided.   Continue maxalt.  Encouraged healthy lifestyle Follow-up in 12 months or sooner PRN  [name], PA-C 08/27/2019 10:45 AM

## 2019-08-27 NOTE — Patient Instructions (Signed)

## 2019-09-20 ENCOUNTER — Other Ambulatory Visit: Payer: Self-pay

## 2019-09-20 ENCOUNTER — Ambulatory Visit
Admission: RE | Admit: 2019-09-20 | Discharge: 2019-09-20 | Disposition: A | Discharge status: Home / Self Care | Payer: Managed Care, Other (non HMO) | Source: Ambulatory Visit | Attending: Family Medicine | Admitting: Family Medicine

## 2019-09-20 DIAGNOSIS — N6320 Unspecified lump in the left breast, unspecified quadrant: Secondary | ICD-10-CM

## 2019-09-20 NOTE — Progress Notes (Signed)
Stable left breast mass. Will need final ultrasound in 1 year with bilateral diagnostic.

## 2019-10-05 ENCOUNTER — Emergency Department
Admission: EM | Admit: 2019-10-05 | Discharge: 2019-10-05 | Disposition: A | Discharge status: Home / Self Care | Payer: Managed Care, Other (non HMO) | Source: Home / Self Care

## 2019-10-05 ENCOUNTER — Emergency Department (INDEPENDENT_AMBULATORY_CARE_PROVIDER_SITE_OTHER): Payer: Managed Care, Other (non HMO)

## 2019-10-05 ENCOUNTER — Other Ambulatory Visit: Payer: Self-pay

## 2019-10-05 ENCOUNTER — Encounter: Payer: Self-pay | Admitting: Emergency Medicine

## 2019-10-05 DIAGNOSIS — R071 Chest pain on breathing: Secondary | ICD-10-CM

## 2019-10-05 DIAGNOSIS — R0602 Shortness of breath: Secondary | ICD-10-CM

## 2019-10-05 DIAGNOSIS — J9811 Atelectasis: Secondary | ICD-10-CM | POA: Diagnosis not present

## 2019-10-05 DIAGNOSIS — R079 Chest pain, unspecified: Secondary | ICD-10-CM | POA: Diagnosis not present

## 2019-10-05 MED ORDER — PREDNISONE 50 MG PO TABS: 50.0000 mg | ORAL_TABLET | Freq: Every day | ORAL | 0 refills | Status: AC

## 2019-10-05 MED ORDER — DEXAMETHASONE SODIUM PHOSPHATE 10 MG/ML IJ SOLN
10.0000 mg | Freq: Once | INTRAMUSCULAR | Status: AC
  Administered 2019-10-05: 10 mg via INTRAMUSCULAR

## 2019-10-05 MED ORDER — ALBUTEROL SULFATE HFA 108 (90 BASE) MCG/ACT IN AERS: 1.0000 | INHALATION_SPRAY | Freq: Four times a day (QID) | RESPIRATORY_TRACT | 0 refills | Status: DC | PRN

## 2019-10-05 NOTE — Discharge Instructions (Addendum)
°  You were given a shot of decadron (a steroid) today to help with inflammation in your lungs to help you breath better and to help with your cough.  You have been prescribed 5 days of prednisone, an oral steroid.  You may start this medication tomorrow with breakfast.    You may also use the prescribed inhaler to help with your shortness of breath.  Call 911 or have someone drive you to the hospital if you develop worsening shortness of breath, worsening chest pain, dizziness/passing out, or other new concerning symptoms develop.

## 2019-10-05 NOTE — ED Triage Notes (Addendum)
Shortness of breath started yesterday, when I breath it hurts.I cough all the time because I smoke, but this is worse

## 2019-10-05 NOTE — ED Provider Notes (Addendum)
[facility]    CSN: [id number] Arrival date & time: 10/05/19  1036      History   Chief Complaint Chief Complaint  Patient presents with  . Shortness of Breath    HPI Anne Mcdonald is a 59 y.o. female.   HPI  Anne Mcdonald is a 59 y.o. female presenting to UC with c/o chest pain with breathing since yesterday.  Pain is a burning sensation in center of her chest. She reports sanding and painting her kitchen cabinets inside her house without wearing a mask last week, wonders if that could have caused her symptoms. Denies fever, chills, new cough (chronic cough from smoking), n/v/d. Denies hx of asthma or COPD. No hx of blood clots. Denies leg pain or swelling. No sick contacts. Pt has not received the covid vaccine, does not plan to get the vaccine. Pt not concerned about having Covid. Does not want to be tested.     Past Medical History:  Diagnosis Date  . Fibroid uterus   . PUD (peptic ulcer disease)     Patient Active Problem List   Diagnosis Date Noted  . RLS (restless legs syndrome) 07/15/2018  . Seborrheic dermatitis 07/15/2018  . Trigger thumb, left thumb 08/06/2017  . Primary osteoarthritis of both hands 05/02/2017  . Right carpal tunnel syndrome 05/02/2017  . Dyshidrotic eczema 12/24/2016  . Uterovaginal prolapse 08/27/2016  . Bipolar 1 disorder ([place]) 08/02/2016  . Moderate episode of recurrent major depressive disorder ([place]) 05/07/2016  . Uterine fibroid 09/18/2015  . Chronic migraine without aura without status migrainosus, not intractable 05/02/2015  . Muscle spasm 05/02/2015  . Medication overuse headache 05/02/2015  . Headache, menstrual migraine, intractable, with status migrainosus 12/09/2014  . GERD (gastroesophageal reflux disease) 11/29/2014  . Paroxysmal SVT (supraventricular tachycardia) ([place]) 06/10/2012  . CIGARETTE SMOKER 08/24/2009  . Migraine without aura 09/07/2008  . HYPERTRIGLYCERIDEMIA 07/19/2008  . PREMENSTRUAL  DYSPHORIC SYNDROME 04/24/2006  . HOT FLASHES 04/24/2006  . Acute left-sided low back pain with left-sided sciatica 04/24/2006  . FATIGUE 04/24/2006    Past Surgical History:  Procedure Laterality Date  . CESAREAN SECTION    . CHOLECYSTECTOMY      OB History    Gravida  5   Para  5   Term  5   Preterm      AB      Living  5     SAB      TAB      Ectopic      Multiple      Live Births  5            Home Medications    Prior to Admission medications   Medication Sig Start Date End Date Taking? Authorizing Provider  albuterol (VENTOLIN HFA) 108 (90 Base) MCG/ACT inhaler Inhale 1-2 puffs into the lungs every 6 (six) hours as needed for wheezing or shortness of breath. 10/05/19   Noe Gens, PA-C  Ascorbic Acid (VITAMIN C) 1000 MG tablet Take by mouth.    [provider]  Betamethasone Valerate 0.12 % foam Apply 1 application topically daily as needed. Stop after 2 weeks of use. 07/14/18   Hali Marry, MD  BYSTOLIC 2.5 MG tablet TAKE 1 TABLET BY MOUTH EVERY DAY 08/12/19   Hali Marry, MD  CALCIUM-MAGNESIUM-ZINC PO Take by mouth.    [provider]  Cholecalciferol (VITAMIN D3) 5000 units CAPS Take by mouth.    [provider]  clobetasol ointment (  TEMOVATE) 0.05 % Apply to palms of hands QD prn. 12/24/16   Hali Marry, MD  cyclobenzaprine (FLEXERIL) 10 MG tablet Take 1 tablet (10 mg total) by mouth 3 (three) times daily as needed for muscle spasms. 01/04/19   Hali Marry, MD  Galcanezumab-gnlm (EMGALITY) 120 MG/ML SOAJ Inject 240 mg into the skin as directed AND 120 mg every 30 (thirty) days. Inj 240mg  once then 120mg  monthly. 08/27/19   Jaclyn Prime, Collene Leyden, PA-C  hydrOXYzine (VISTARIL) 25 MG capsule TAKE 1 TWICE DAILY AS NEEDED ANXIETY 04/10/17   [provider]  lamoTRIgine (LAMICTAL) 200 MG tablet Take 200 mg by mouth daily.    [provider]  Multiple Vitamin (THERA) TABS Take  by mouth.    [provider]  Omega-3 Fatty Acids (FISH OIL) 1000 MG CAPS Take by mouth.    [provider]  predniSONE (DELTASONE) 50 MG tablet Take 1 tablet (50 mg total) by mouth daily with breakfast for 5 days. 10/05/19 10/10/19  Noe Gens, PA-C  rizatriptan (MAXALT) 10 MG tablet Take 1 tablet (10 mg total) by mouth as needed for migraine. May repeat in 2 hours if needed 08/27/19 09/26/19  Jaclyn Prime, Collene Leyden, PA-C  rOPINIRole (REQUIP) 1 MG tablet TAKE 1 TABLET BY MOUTH EVERYDAY AT BEDTIME 04/19/19   Hali Marry, MD  terbinafine (LAMISIL) 250 MG tablet TAKE 1 TABLET BY MOUTH EVERY DAY 04/16/19   Hali Marry, MD  traZODone (DESYREL) 150 MG tablet TAKE 1 TABLET BY MOUTH EVERYDAY AT BEDTIME 05/09/18   [provider]  VITAMIN E PO Take by mouth.    [provider]    Family History Family History  Adopted: Yes  Family history unknown: Yes    Social History Social History   Tobacco Use  . Smoking status: Current Every Day Smoker    Packs/day: 1.00    Types: Cigarettes  . Smokeless tobacco: Never Used  Vaping Use  . Vaping Use: Never used  Substance Use Topics  . Alcohol use: No  . Drug use: No     Allergies   Other and Trulance [plecanatide]   Review of Systems Review of Systems  Constitutional: Negative for chills and fever.  HENT: Positive for congestion. Negative for ear pain, sore throat, trouble swallowing and voice change.   Respiratory: Positive for cough, chest tightness and shortness of breath.   Cardiovascular: Positive for chest pain. Negative for palpitations and leg swelling.  Gastrointestinal: Negative for abdominal pain, diarrhea, nausea and vomiting.  Musculoskeletal: Negative for arthralgias, back pain and myalgias.  Skin: Negative for rash.  All other systems reviewed and are negative.    Physical Exam Triage Vital Signs ED Triage Vitals  Enc Vitals Group     BP 10/05/19 1049 125/81     Pulse  Rate 10/05/19 1049 68     Resp --      Temp 10/05/19 1049 98.2 F (36.8 C)     Temp Source 10/05/19 1049 Oral     SpO2 --      Weight 10/05/19 1052 144 lb (65.3 kg)     Height 10/05/19 1052 5\' 4"  (1.626 m)     Head Circumference --      Peak Flow --      Pain Score 10/05/19 1051 8     Pain Loc --      Pain Edu? --      Excl. in GC? --    No  data found.  Updated Vital Signs BP 125/81   Pulse 68   Temp 98.2 F (36.8 C) (Oral)   Resp 18   Ht 5\' 4"  (1.626 m)   Wt 144 lb (65.3 kg)   LMP 07/17/2016   SpO2 98%   BMI 24.72 kg/m   Visual Acuity Right Eye Distance:   Left Eye Distance:   Bilateral Distance:    Right Eye Near:   Left Eye Near:    Bilateral Near:     Physical Exam Vitals and nursing note reviewed.  Constitutional:      General: She is not in acute distress.    Appearance: She is well-developed. She is not ill-appearing, toxic-appearing or diaphoretic.  HENT:     Head: Normocephalic and atraumatic.     Right Ear: Tympanic membrane and ear canal normal.     Left Ear: Tympanic membrane and ear canal normal.     Nose: Nose normal.     Right Sinus: No maxillary sinus tenderness or frontal sinus tenderness.     Left Sinus: No maxillary sinus tenderness or frontal sinus tenderness.     Mouth/Throat:     Lips: Pink.     Mouth: Mucous membranes are moist.     Pharynx: Oropharynx is clear. Uvula midline.  Cardiovascular:     Rate and Rhythm: Normal rate and regular rhythm.  Pulmonary:     Effort: Pulmonary effort is normal. No respiratory distress.     Breath sounds: Examination of the right-lower field reveals decreased breath sounds. Examination of the left-lower field reveals decreased breath sounds. Decreased breath sounds present. No wheezing, rhonchi or rales.  Musculoskeletal:        General: Normal range of motion.     Cervical back: Normal range of motion.  Skin:    General: Skin is warm and dry.  Neurological:     Mental Status: She is alert and  oriented to person, place, and time.  Psychiatric:        Behavior: Behavior normal.      UC Treatments / Results  Labs (all labs ordered are listed, but only abnormal results are displayed) Labs Reviewed - No data to display  EKG Date/Time:10/05/2019    11:20:03 Ventricular Rate: 55 PR Interval: 180 QRS Duration: 88 QT Interval: 420 QTC Calculation: 401 P-R-T axes: 56   74    74 Text Interpretation: Sinus bradycardia, otherwise normal ECG   Radiology DG Chest 2 View  Result Date: 10/05/2019 CLINICAL DATA:  Chest pain, shortness of breath EXAM: CHEST - 2 VIEW COMPARISON:  09/18/2018 FINDINGS: The heart size and mediastinal contours are within normal limits. Linear opacity in the left lung base. Right lung is clear. No pleural effusion or pneumothorax. The visualized skeletal structures are unremarkable. IMPRESSION: Linear opacity in the left lung base, favor atelectasis. Otherwise no acute cardiopulmonary findings. Electronically Signed   By: Davina Poke D.O.   On: 10/05/2019 11:36    Procedures Procedures (including critical care time)  Medications Ordered in UC Medications  dexamethasone (DECADRON) injection 10 mg (10 mg Intramuscular Given 10/05/19 1159)    Initial Impression / Assessment and Plan / UC Course  I have reviewed the triage vital signs and the nursing notes.  Pertinent labs & imaging results that were available during my care of the patient were reviewed by me and considered in my medical decision making (see chart for details).     No evidence of bacterial infection Atelectasis likely due to inhalation  of irritates a few days ago while painting and sanding cabinets without a mask.  EKG: unremarkable Doubt PE, no hx of clots, no leg pain or swelling, no recent travel or surgery. Vitals WNL  Will tx with steroids and albuterol inhaler Discouraged smoking F/u PCP, especially if not improving later this week. Discussed symptoms that warrant  emergent care in the ED. AVS given  Final Clinical Impressions(s) / UC Diagnoses   Final diagnoses:  Shortness of breath  Chest pain on breathing  Atelectasis     Discharge Instructions      You were given a shot of decadron (a steroid) today to help with inflammation in your lungs to help you breath better and to help with your cough.  You have been prescribed 5 days of prednisone, an oral steroid.  You may start this medication tomorrow with breakfast.    You may also use the prescribed inhaler to help with your shortness of breath.  Call 911 or have someone drive you to the hospital if you develop worsening shortness of breath, worsening chest pain, dizziness/passing out, or other new concerning symptoms develop.     ED Prescriptions    Medication Sig Dispense Auth. Provider   predniSONE (DELTASONE) 50 MG tablet Take 1 tablet (50 mg total) by mouth daily with breakfast for 5 days. 5 tablet Gerarda Fraction, Lera Gaines O, PA-C   albuterol (VENTOLIN HFA) 108 (90 Base) MCG/ACT inhaler Inhale 1-2 puffs into the lungs every 6 (six) hours as needed for wheezing or shortness of breath. 18 g Noe Gens, PA-C     PDMP not reviewed this encounter.   Noe Gens, PA-C 10/05/19 1533    Noe Gens, PA-C 10/05/19 1609

## 2019-10-12 ENCOUNTER — Other Ambulatory Visit: Payer: Self-pay | Admitting: Family Medicine

## 2019-10-12 DIAGNOSIS — G2581 Restless legs syndrome: Secondary | ICD-10-CM

## 2019-10-21 ENCOUNTER — Other Ambulatory Visit: Payer: Self-pay | Admitting: Family Medicine

## 2019-11-25 ENCOUNTER — Other Ambulatory Visit: Payer: Self-pay | Admitting: Physician Assistant

## 2019-12-06 ENCOUNTER — Other Ambulatory Visit: Payer: Self-pay | Admitting: Physician Assistant

## 2019-12-08 NOTE — Progress Notes (Signed)
Established Patient Office Visit  Subjective:  Patient ID: Anne Mcdonald, female    DOB: February 09, 1961  Age: 59 y.o. MRN: [id number]  CC:  Chief Complaint  Patient presents with  . restless leg    HPI Anne Mcdonald presents for   RLS follow up  -she reports that every night she takes 1 tab of the ropinirole but occasionally if she wants to rest in the afternoon or lay down she will take an extra tab because usually within about 30 minutes her restless leg symptoms start back.  She also reports that more recently she has been giving her husband some of the ropinirole as well and so now she is completely out.  She cannot get a refill until October.  She is also having more abdominal pain.  She saw GI and had a colonoscopy in March.  Dr. Loura Halt, her gastroenterologist actually discontinued her doxycycline.  Per the note it was because it was not working.  But she is actually continued to fill the medication up until this last month when she ran out of refills she says her abdominal discomfort and pain has been much worse since coming off of the medication and she would like a refill she actually does have a virtual follow-up appointment with him in 2 weeks.  She also complains of bilateral knee pain but it is really the left that is bothering her the most.  She says sometimes it will crack or pop and it is really painful when it happens.  Its not constant but does happen occasionally.  She is not currently taking any medications or applying any topical agents.  She denies any significant swelling or giving out of the joint.  Past Medical History:  Diagnosis Date  . Fibroid uterus   . PUD (peptic ulcer disease)     Past Surgical History:  Procedure Laterality Date  . CESAREAN SECTION    . CHOLECYSTECTOMY      Family History  Adopted: Yes  Family history unknown: Yes    Social History   Socioeconomic History  . Marital status: Married    Spouse name: Not on file   . Number of children: Not on file  . Years of education: Not on file  . Highest education level: Not on file  Occupational History  . Not on file  Tobacco Use  . Smoking status: Current Every Day Smoker    Packs/day: 1.00    Types: Cigarettes  . Smokeless tobacco: Never Used  Vaping Use  . Vaping Use: Never used  Substance and Sexual Activity  . Alcohol use: No  . Drug use: No  . Sexual activity: Not Currently    Birth control/protection: None, Surgical  Other Topics Concern  . Not on file  Social History Narrative  . Not on file   Social Determinants of Health   Financial Resource Strain:   . Difficulty of Paying Living Expenses: Not on file  Food Insecurity:   . Worried About Charity fundraiser in the Last Year: Not on file  . Ran Out of Food in the Last Year: Not on file  Transportation Needs:   . Lack of Transportation (Medical): Not on file  . Lack of Transportation (Non-Medical): Not on file  Physical Activity:   . Days of Exercise per Week: Not on file  . Minutes of Exercise per Session: Not on file  Stress:   . Feeling of Stress : Not on file  Social Connections:   .  Frequency of Communication with Friends and Family: Not on file  . Frequency of Social Gatherings with Friends and Family: Not on file  . Attends Religious Services: Not on file  . Active Member of Clubs or Organizations: Not on file  . Attends Archivist Meetings: Not on file  . Marital Status: Not on file  Intimate Partner Violence:   . Fear of Current or Ex-Partner: Not on file  . Emotionally Abused: Not on file  . Physically Abused: Not on file  . Sexually Abused: Not on file    Outpatient Medications Prior to Visit  Medication Sig Dispense Refill  . Ascorbic Acid (VITAMIN C) 1000 MG tablet Take by mouth.    . BYSTOLIC 2.5 MG tablet TAKE 1 TABLET BY MOUTH EVERY DAY 30 tablet 3  . CALCIUM-MAGNESIUM-ZINC PO Take by mouth.    . Cholecalciferol (VITAMIN D3) 5000 units CAPS Take  by mouth.    . clobetasol ointment (TEMOVATE) 0.05 % Apply to palms of hands QD prn. 45 g 0  . cyclobenzaprine (FLEXERIL) 10 MG tablet Take 1 tablet (10 mg total) by mouth 3 (three) times daily as needed for muscle spasms. 30 tablet 0  . Galcanezumab-gnlm (EMGALITY) 120 MG/ML SOAJ Inject 240 mg into the skin as directed AND 120 mg every 30 (thirty) days. Inj 240mg  once then 120mg  monthly. 1 pen 11  . lamoTRIgine (LAMICTAL) 200 MG tablet Take 200 mg by mouth daily.    . Multiple Vitamin (THERA) TABS Take by mouth.    . Omega-3 Fatty Acids (FISH OIL) 1000 MG CAPS Take by mouth.    . rizatriptan (MAXALT) 10 MG tablet TAKE 1 TABLET BY MOUTH AS NEEDED FOR MIGRAINE. MAY REPEAT IN 2 HOURS IF NEEDED 12 tablet 1  . terbinafine (LAMISIL) 250 MG tablet TAKE 1 TABLET BY MOUTH EVERY DAY 90 tablet 1  . traZODone (DESYREL) 150 MG tablet TAKE 1 TABLET BY MOUTH EVERYDAY AT BEDTIME    . VITAMIN E PO Take by mouth.    Marland Kitchen rOPINIRole (REQUIP) 1 MG tablet TAKE 1 TABLET BY MOUTH EVERYDAY AT BEDTIME 90 tablet 1  . albuterol (VENTOLIN HFA) 108 (90 Base) MCG/ACT inhaler Inhale 1-2 puffs into the lungs every 6 (six) hours as needed for wheezing or shortness of breath. 18 g 0  . Betamethasone Valerate 0.12 % foam Apply 1 application topically daily as needed. Stop after 2 weeks of use. 100 g 1  . hydrOXYzine (VISTARIL) 25 MG capsule TAKE 1 TWICE DAILY AS NEEDED ANXIETY  0   No facility-administered medications prior to visit.    Allergies  Allergen Reactions  . Other Rash  . Trulance [Plecanatide] Other (See Comments)    Abdominal pain    ROS Review of Systems    Objective:    Physical Exam Constitutional:      Appearance: She is well-developed.  HENT:     Head: Normocephalic and atraumatic.  Cardiovascular:     Rate and Rhythm: Normal rate and regular rhythm.     Heart sounds: Normal heart sounds.  Pulmonary:     Effort: Pulmonary effort is normal.     Breath sounds: Normal breath sounds.   Musculoskeletal:     Comments: Both knees with knee with normal flexion, extension with significant crepitus.  Mildly tender along the medial joint line on the left knee.  No popping or clicking with range of motion.  Negative McMurray's bilaterally.  Strength at the hip knee and ankle is 5 out  of 5.  Patellar reflex 1+ bilaterally.  No increased laxity with anterior drawer.  Skin:    General: Skin is warm and dry.  Neurological:     Mental Status: She is alert and oriented to person, place, and time.  Psychiatric:        Behavior: Behavior normal.     BP 120/62   Pulse 61   Ht 5\' 4"  (1.626 m)   Wt 147 lb (66.7 kg)   LMP 07/17/2016   SpO2 100%   BMI 25.23 kg/m  Wt Readings from Last 3 Encounters:  12/09/19 147 lb (66.7 kg)  10/05/19 144 lb (65.3 kg)  08/27/19 142 lb (64.4 kg)     There are no preventive care reminders to display for this patient.  There are no preventive care reminders to display for this patient.  Lab Results  Component Value Date   TSH 2.45 07/21/2018   Lab Results  Component Value Date   WBC 9.2 07/27/2018   HGB 13.6 07/27/2018   HCT 38.3 07/27/2018   MCV 93.4 07/27/2018   PLT 342 07/27/2018   Lab Results  Component Value Date   NA 140 11/19/2018   K 4.4 11/19/2018   CO2 24 11/19/2018   GLUCOSE 115 (H) 11/19/2018   BUN 19 11/19/2018   CREATININE 0.90 11/19/2018   BILITOT 0.7 11/19/2018   ALKPHOS 86 11/26/2016   AST 13 11/19/2018   ALT 12 11/19/2018   PROT 6.5 11/19/2018   ALBUMIN 4.4 11/26/2016   CALCIUM 10.3 11/19/2018   Lab Results  Component Value Date   CHOL 202 (H) 07/21/2018   Lab Results  Component Value Date   HDL 58 07/21/2018   Lab Results  Component Value Date   LDLCALC 124 (H) 07/21/2018   Lab Results  Component Value Date   TRIG 98 07/21/2018   Lab Results  Component Value Date   CHOLHDL 3.5 07/21/2018   Lab Results  Component Value Date   HGBA1C 5.1 12/02/2018      Assessment & Plan:   Problem  List Items Addressed This Visit      Cardiovascular and Mediastinum   Migraine without aura - Primary    Migraines are okay right now overall well controlled on Emgality.        Digestive   Irritable bowel syndrome with constipation    She really feels like the dicyclomine did help when she came off of it she felt like it made a big difference.  Discussed with her that she really needs to speak with GI about that as they are managing this condition I did go ahead and refill her for 30 days to get her until her appointment with the GI but ultimately it needs to be a discussion with them about what the best regimen is for her.      Relevant Medications   dicyclomine (BENTYL) 10 MG capsule     Nervous and Auditory   Hearing loss     Other   RLS (restless legs syndrome)    She would like to be able to take an extra tab occasionally so I did adjust her dose on the ropinirole but reminded her that she should not give it to other people as it is a prescription drug and may cause side effects or problems depending on what that other person is taking.      Relevant Medications   rOPINIRole (REQUIP) 1 MG tablet    Other Visit Diagnoses  Acute pain of left knee       Relevant Orders   DG Knee Complete 4 Views Left (Completed)     Left knee pain-fairly normal exam today no worrisome findings but she is having painful popping.  She also does have significant crepitus on exam suspect OA.  We will get x-rays for further evaluation and she is never had any work-up.  Meds ordered this encounter  Medications  . dicyclomine (BENTYL) 10 MG capsule    Sig: Take 1 capsule (10 mg total) by mouth 4 (four) times daily -  before meals and at bedtime.    Dispense:  60 capsule    Refill:  0  . rOPINIRole (REQUIP) 1 MG tablet    Sig: Take 1 tablet (1 mg total) by mouth at bedtime. Ok to take extra tab QD prn.    Dispense:  135 tablet    Refill:  3    Follow-up: No follow-ups on file.     Beatrice Lecher, MD

## 2019-12-09 ENCOUNTER — Ambulatory Visit (INDEPENDENT_AMBULATORY_CARE_PROVIDER_SITE_OTHER): Payer: Managed Care, Other (non HMO)

## 2019-12-09 ENCOUNTER — Ambulatory Visit (INDEPENDENT_AMBULATORY_CARE_PROVIDER_SITE_OTHER): Payer: Managed Care, Other (non HMO) | Admitting: Family Medicine

## 2019-12-09 ENCOUNTER — Other Ambulatory Visit: Payer: Self-pay

## 2019-12-09 VITALS — BP 120/62 | HR 61 | Ht 64.0 in | Wt 147.0 lb

## 2019-12-09 DIAGNOSIS — K581 Irritable bowel syndrome with constipation: Secondary | ICD-10-CM

## 2019-12-09 DIAGNOSIS — G2581 Restless legs syndrome: Secondary | ICD-10-CM

## 2019-12-09 DIAGNOSIS — H9193 Unspecified hearing loss, bilateral: Secondary | ICD-10-CM

## 2019-12-09 DIAGNOSIS — G43009 Migraine without aura, not intractable, without status migrainosus: Secondary | ICD-10-CM | POA: Diagnosis not present

## 2019-12-09 DIAGNOSIS — M25562 Pain in left knee: Secondary | ICD-10-CM

## 2019-12-09 MED ORDER — DICYCLOMINE HCL 10 MG PO CAPS: 10.0000 mg | ORAL_CAPSULE | Freq: Three times a day (TID) | ORAL | 0 refills | Status: DC

## 2019-12-09 MED ORDER — ROPINIROLE HCL 1 MG PO TABS: 1.0000 mg | ORAL_TABLET | Freq: Every day | ORAL | 3 refills | Status: DC

## 2019-12-10 ENCOUNTER — Encounter: Payer: Self-pay | Admitting: Family Medicine

## 2019-12-10 DIAGNOSIS — H9190 Unspecified hearing loss, unspecified ear: Secondary | ICD-10-CM | POA: Insufficient documentation

## 2019-12-10 NOTE — Assessment & Plan Note (Signed)
She really feels like the dicyclomine did help when she came off of it she felt like it made a big difference.  Discussed with her that she really needs to speak with GI about that as they are managing this condition I did go ahead and refill her for 30 days to get her until her appointment with the GI but ultimately it needs to be a discussion with them about what the best regimen is for her.

## 2019-12-10 NOTE — Assessment & Plan Note (Addendum)
Migraines are okay right now overall well controlled on Emgality.

## 2019-12-10 NOTE — Assessment & Plan Note (Signed)
She would like to be able to take an extra tab occasionally so I did adjust her dose on the ropinirole but reminded her that she should not give it to other people as it is a prescription drug and may cause side effects or problems depending on what that other person is taking.

## 2019-12-16 ENCOUNTER — Other Ambulatory Visit: Payer: Self-pay | Admitting: Family Medicine

## 2019-12-16 DIAGNOSIS — I471 Supraventricular tachycardia: Secondary | ICD-10-CM

## 2019-12-17 ENCOUNTER — Other Ambulatory Visit: Payer: Self-pay | Admitting: Family Medicine

## 2019-12-17 DIAGNOSIS — K581 Irritable bowel syndrome with constipation: Secondary | ICD-10-CM

## 2019-12-22 ENCOUNTER — Other Ambulatory Visit: Payer: Self-pay

## 2019-12-22 ENCOUNTER — Encounter: Payer: Self-pay | Admitting: Sports Medicine

## 2019-12-22 ENCOUNTER — Ambulatory Visit (INDEPENDENT_AMBULATORY_CARE_PROVIDER_SITE_OTHER): Payer: Managed Care, Other (non HMO)

## 2019-12-22 ENCOUNTER — Ambulatory Visit (INDEPENDENT_AMBULATORY_CARE_PROVIDER_SITE_OTHER): Payer: Managed Care, Other (non HMO) | Admitting: Sports Medicine

## 2019-12-22 DIAGNOSIS — M545 Low back pain: Secondary | ICD-10-CM

## 2019-12-22 DIAGNOSIS — M5442 Lumbago with sciatica, left side: Secondary | ICD-10-CM

## 2019-12-22 MED ORDER — HYDROCODONE-ACETAMINOPHEN 5-325 MG PO TABS: 1.0000 | ORAL_TABLET | Freq: Three times a day (TID) | ORAL | 0 refills | Status: DC | PRN

## 2019-12-22 MED ORDER — PREDNISONE 10 MG (48) PO TBPK: ORAL_TABLET | Freq: Every day | ORAL | 0 refills | Status: DC

## 2019-12-22 NOTE — Assessment & Plan Note (Signed)
This is a pleasant 59 year old female, she throughout her back again doing some yard work. Pain is axial, discogenic. No red flag symptoms. She was seen in a minute clinic, she got some prednisone, muscle relaxer and did not have much improvement in her symptoms, we are going to go a little bit more aggressive, 12-day prednisone taper, short course of hydrocodone, x-rays, formal PT. Return to see me in 6 weeks, MRI for interventional planning if no better. Her knee pain has resolved.

## 2019-12-22 NOTE — Progress Notes (Signed)
    Procedures performed today:    None.  Independent interpretation of notes and tests performed by another provider:   None.  Brief History, Exam, Impression, and Recommendations:    Acute left-sided low back pain with left-sided sciatica This is a pleasant 59 year old female, she throughout her back again doing some yard work. Pain is axial, discogenic. No red flag symptoms. She was seen in a [facility] clinic, she got some prednisone, muscle relaxer and did not have much improvement in her symptoms, we are going to go a little bit more aggressive, 12-day prednisone taper, short course of hydrocodone, x-rays, formal PT. Return to see me in 6 weeks, MRI for interventional planning if no better. Her knee pain has resolved.    ___________________________________________ Gwen Her. Dianah Field, M.D., ABFM., CAQSM. Primary Care and [place] Instructor of [place] of [facility] of Medicine

## 2019-12-27 ENCOUNTER — Other Ambulatory Visit: Payer: Self-pay | Admitting: Family Medicine

## 2019-12-27 DIAGNOSIS — K581 Irritable bowel syndrome with constipation: Secondary | ICD-10-CM

## 2020-01-27 ENCOUNTER — Other Ambulatory Visit: Payer: Self-pay | Admitting: Physician Assistant

## 2020-02-02 ENCOUNTER — Other Ambulatory Visit: Payer: Self-pay

## 2020-02-02 ENCOUNTER — Ambulatory Visit: Payer: Managed Care, Other (non HMO) | Admitting: Sports Medicine

## 2020-02-02 DIAGNOSIS — L219 Seborrheic dermatitis, unspecified: Secondary | ICD-10-CM | POA: Diagnosis not present

## 2020-02-02 DIAGNOSIS — M5442 Lumbago with sciatica, left side: Secondary | ICD-10-CM | POA: Diagnosis not present

## 2020-02-02 MED ORDER — KETOCONAZOLE 2 % EX SHAM: 1.0000 "application " | MEDICATED_SHAMPOO | CUTANEOUS | 6 refills | Status: DC

## 2020-02-02 NOTE — Assessment & Plan Note (Signed)
Acute low back pain with lumbar degenerative disc disease, now resolved with a prednisone taper, hydrocodone. Return as needed.

## 2020-02-02 NOTE — Progress Notes (Signed)
    Procedures performed today:    None.  Independent interpretation of notes and tests performed by another provider:   None.  Brief History, Exam, Impression, and Recommendations:    Acute left-sided low back pain with left-sided sciatica Acute low back pain with lumbar degenerative disc disease, now resolved with a prednisone taper, hydrocodone. Return as needed.  Seborrheic dermatitis Needs refill on ketoconazole shampoo. She also had some questions about her memory and her daughters lymphadenopathy, she will make appointments for herself and her daughter with their PCP.    ___________________________________________ Gwen Her. Dianah Field, M.D., ABFM., CAQSM. Primary Care and [place] Instructor of [place] of [facility]

## 2020-02-02 NOTE — Assessment & Plan Note (Addendum)
Needs refill on ketoconazole shampoo. She also had some questions about her memory and her daughters lymphadenopathy, she will make appointments for herself and her daughter with their PCP.

## 2020-02-14 ENCOUNTER — Ambulatory Visit: Payer: Managed Care, Other (non HMO) | Admitting: Family Medicine

## 2020-02-14 ENCOUNTER — Other Ambulatory Visit: Payer: Self-pay

## 2020-02-14 ENCOUNTER — Encounter: Payer: Self-pay | Admitting: Psychology

## 2020-02-14 ENCOUNTER — Encounter: Payer: Self-pay | Admitting: Family Medicine

## 2020-02-14 VITALS — BP 118/82 | HR 70 | Ht 64.17 in | Wt 149.2 lb

## 2020-02-14 DIAGNOSIS — G43009 Migraine without aura, not intractable, without status migrainosus: Secondary | ICD-10-CM

## 2020-02-14 DIAGNOSIS — R413 Other amnesia: Secondary | ICD-10-CM | POA: Diagnosis not present

## 2020-02-14 DIAGNOSIS — F331 Major depressive disorder, recurrent, moderate: Secondary | ICD-10-CM

## 2020-02-14 DIAGNOSIS — R61 Generalized hyperhidrosis: Secondary | ICD-10-CM | POA: Diagnosis not present

## 2020-02-14 NOTE — Progress Notes (Signed)
Established Patient Office Visit  Subjective:  Patient ID: Anne Mcdonald, female    DOB: [birth year]  Age: 59 y.o. MRN: [id number]  CC:  Chief Complaint  Patient presents with  . Memory Loss    HPI Anne Mcdonald presents for f/u of memory issues.  She reports over the last year she is really been struggling more with her memory particularly may be the last 9 months.  He says she will forget conversations that she has had.  She says twice she is actually mixed up her medications and could not remember if she taken them or not taking them.  She is accidentally left things in places that they do not usually go and lost items.  She said that she actually had a brief period when she was driving to [place] recently to visit her mother when she could not quite remember where she was.  She says she has been driving it for 30 years and so it really quite scared her that she could not remember where she was.  She has not noticed any significant difficulty paying bills etc. she does take a multivitamin no prior history of stroke or vascular disease.  She does have significant hearing loss and does not wear hearing aids.  She has had more frequent headaches recently.  She also complains of night sweats though she is postmenopausal.  For her mood and feels like that is actually pretty well controlled right now.  She is adopted so no known history of Alzheimer's in the family.  Denies any other neurologic symptoms such as numbness or tingling.  Just the increase in headache frequency recently.  No weakness.  Past Medical History:  Diagnosis Date  . Fibroid uterus   . PUD (peptic ulcer disease)     Past Surgical History:  Procedure Laterality Date  . CESAREAN SECTION    . CHOLECYSTECTOMY      Family History  Adopted: Yes  Family history unknown: Yes    Social History   Socioeconomic History  . Marital status: Married    Spouse name: Not on file  . Number of children: Not on file  .  Years of education: Not on file  . Highest education level: Not on file  Occupational History  . Not on file  Tobacco Use  . Smoking status: Current Every Day Smoker    Packs/day: 1.00    Types: Cigarettes  . Smokeless tobacco: Never Used  Vaping Use  . Vaping Use: Never used  Substance and Sexual Activity  . Alcohol use: No  . Drug use: No  . Sexual activity: Not Currently    Birth control/protection: None, Surgical  Other Topics Concern  . Not on file  Social History Narrative  . Not on file   Social Determinants of Health   Financial Resource Strain:   . Difficulty of Paying Living Expenses: Not on file  Food Insecurity:   . Worried About Charity fundraiser in the Last Year: Not on file  . Ran Out of Food in the Last Year: Not on file  Transportation Needs:   . Lack of Transportation (Medical): Not on file  . Lack of Transportation (Non-Medical): Not on file  Physical Activity:   . Days of Exercise per Week: Not on file  . Minutes of Exercise per Session: Not on file  Stress:   . Feeling of Stress : Not on file  Social Connections:   . Frequency of Communication with Friends and Family:  Not on file  . Frequency of Social Gatherings with Friends and Family: Not on file  . Attends Religious Services: Not on file  . Active Member of Clubs or Organizations: Not on file  . Attends Archivist Meetings: Not on file  . Marital Status: Not on file  Intimate Partner Violence:   . Fear of Current or Ex-Partner: Not on file  . Emotionally Abused: Not on file  . Physically Abused: Not on file  . Sexually Abused: Not on file    Outpatient Medications Prior to Visit  Medication Sig Dispense Refill  . Ascorbic Acid (VITAMIN C) 1000 MG tablet Take by mouth.    . BYSTOLIC 2.5 MG tablet TAKE 1 TABLET BY MOUTH EVERY DAY 90 tablet 1  . CALCIUM-MAGNESIUM-ZINC PO Take by mouth.    . Cholecalciferol (VITAMIN D3) 5000 units CAPS Take by mouth.    . clobetasol ointment  (TEMOVATE) 0.05 % Apply to palms of hands QD prn. 45 g 0  . cyclobenzaprine (FLEXERIL) 10 MG tablet Take 1 tablet (10 mg total) by mouth 3 (three) times daily as needed for muscle spasms. 30 tablet 0  . dicyclomine (BENTYL) 10 MG capsule TAKE 1 CAPSULE (10 MG TOTAL) BY MOUTH 4 (FOUR) TIMES DAILY - BEFORE MEALS AND AT BEDTIME. 60 capsule 0  . Galcanezumab-gnlm (EMGALITY) 120 MG/ML SOAJ Inject 240 mg into the skin as directed AND 120 mg every 30 (thirty) days. Inj 240mg  once then 120mg  monthly. 1 pen 11  . HYDROcodone-acetaminophen (NORCO/VICODIN) 5-325 MG tablet Take 1 tablet by mouth every 8 (eight) hours as needed for moderate pain. 15 tablet 0  . ketoconazole (NIZORAL) 2 % shampoo Apply 1 application topically 2 (two) times a week. 120 mL 6  . lamoTRIgine (LAMICTAL) 200 MG tablet Take 200 mg by mouth daily.    . Multiple Vitamin (THERA) TABS Take by mouth.    . Omega-3 Fatty Acids (FISH OIL) 1000 MG CAPS Take by mouth.    . rizatriptan (MAXALT) 10 MG tablet TAKE 1 TABLET BY MOUTH AS NEEDED FOR MIGRAINE. MAY REPEAT IN 2 HOURS IF NEEDED 12 tablet 1  . rOPINIRole (REQUIP) 1 MG tablet Take 1 tablet (1 mg total) by mouth at bedtime. Ok to take extra tab QD prn. 135 tablet 3  . terbinafine (LAMISIL) 250 MG tablet TAKE 1 TABLET BY MOUTH EVERY DAY 90 tablet 1  . traZODone (DESYREL) 150 MG tablet TAKE 1 TABLET BY MOUTH EVERYDAY AT BEDTIME    . VITAMIN E PO Take by mouth.    . predniSONE (STERAPRED UNI-PAK 48 TAB) 10 MG (48) TBPK tablet Take by mouth daily. 12-day taper pack, use as directed for taper 1 tablet 0   No facility-administered medications prior to visit.    Allergies  Allergen Reactions  . Other Rash  . Trulance [Plecanatide] Other (See Comments)    Abdominal pain    ROS Review of Systems    Objective:    Physical Exam Vitals reviewed.  Constitutional:      Appearance: She is well-developed.  HENT:     Head: Normocephalic and atraumatic.  Eyes:     Conjunctiva/sclera:  Conjunctivae normal.  Cardiovascular:     Rate and Rhythm: Normal rate.  Pulmonary:     Effort: Pulmonary effort is normal.  Skin:    General: Skin is dry.     Coloration: Skin is not pale.  Neurological:     Mental Status: She is alert and oriented to person,  place, and time.  Psychiatric:        Behavior: Behavior normal.     BP 118/82 (BP Location: Left Arm, Patient Position: Sitting, Cuff Size: Normal)   Pulse 70   Ht 5' 4.17" (1.63 m)   Wt 149 lb 3.2 oz (67.7 kg)   LMP 07/17/2016   SpO2 100%   BMI 25.47 kg/m  Wt Readings from Last 3 Encounters:  02/14/20 149 lb 3.2 oz (67.7 kg)  12/09/19 147 lb (66.7 kg)  10/05/19 144 lb (65.3 kg)     There are no preventive care reminders to display for this patient.  There are no preventive care reminders to display for this patient.  Lab Results  Component Value Date   TSH 2.45 07/21/2018   Lab Results  Component Value Date   WBC 9.2 07/27/2018   HGB 13.6 07/27/2018   HCT 38.3 07/27/2018   MCV 93.4 07/27/2018   PLT 342 07/27/2018   Lab Results  Component Value Date   NA 140 11/19/2018   K 4.4 11/19/2018   CO2 24 11/19/2018   GLUCOSE 115 (H) 11/19/2018   BUN 19 11/19/2018   CREATININE 0.90 11/19/2018   BILITOT 0.7 11/19/2018   ALKPHOS 86 11/26/2016   AST 13 11/19/2018   ALT 12 11/19/2018   PROT 6.5 11/19/2018   ALBUMIN 4.4 11/26/2016   CALCIUM 10.3 11/19/2018   Lab Results  Component Value Date   CHOL 202 (H) 07/21/2018   Lab Results  Component Value Date   HDL 58 07/21/2018   Lab Results  Component Value Date   LDLCALC 124 (H) 07/21/2018   Lab Results  Component Value Date   TRIG 98 07/21/2018   Lab Results  Component Value Date   CHOLHDL 3.5 07/21/2018   Lab Results  Component Value Date   HGBA1C 5.1 12/02/2018      Assessment & Plan:   Problem List Items Addressed This Visit      Cardiovascular and Mediastinum   Migraine without aura - Primary    She has had more frequent  headaches.        Relevant Orders   CBC   COMPLETE METABOLIC PANEL WITH GFR   Lipid panel   TSH   Progesterone   Estradiol   Follicle stimulating hormone   Luteinizing hormone   B12   Vitamin B6   Vitamin B1   Fe+TIBC+Fer   VITAMIN D 25 Hydroxy (Vit-D Deficiency, Fractures)   MR Brain W Wo Contrast     Other   Moderate episode of recurrent major depressive disorder ([place])    Follows with psychiatry.  No recent medication changes.      Memory impairment    She has some very legitimate memory concerns that seem to be more persistent over the last year.  Mini-Mental status exam score of 30 out of 30.  With completing high school and 1 year of college.  Please see scanned document.  Order do some additional labs to rule out deficiencies, move forward with brain MRI for further work-up and refer for neuropsychiatric evaluation and testing.  Consider medication side effect.  Check CMP, lipid, CBC, TSH, B12, B6 and B1.  Also check hormone levels since she is complaining of hot flashes at night.  She says even now she is concerned that she may not remember this conversation and to tell her husband later this evening when he gets home.      Relevant Orders   CBC  COMPLETE METABOLIC PANEL WITH GFR   Lipid panel   TSH   Progesterone   Estradiol   Follicle stimulating hormone   Luteinizing hormone   B12   Vitamin B6   Vitamin B1   Fe+TIBC+Fer   VITAMIN D 25 Hydroxy (Vit-D Deficiency, Fractures)   Ambulatory referral to Neuropsychology   MR Brain W Wo Contrast    Other Visit Diagnoses    Night sweats       Relevant Orders   CBC   COMPLETE METABOLIC PANEL WITH GFR   Lipid panel   TSH   Progesterone   Estradiol   Follicle stimulating hormone   Luteinizing hormone   B12   Vitamin B6   Vitamin B1   Fe+TIBC+Fer   VITAMIN D 25 Hydroxy (Vit-D Deficiency, Fractures)      No orders of the defined types were placed in this encounter.   Follow-up: No follow-ups on file.     Beatrice Lecher, MD

## 2020-02-14 NOTE — Patient Instructions (Addendum)
To work-up your memory concerns we are going to start first with blood work.  We should have those results back over the next couple of days.  We will check for some deficiencies, check her thyroid, check for anemia and will check her hormone levels as well. The next step is to also get an MRI of your brain just to rule out tumor, growth, extra fluid, sign of old stroke etc. Then we will work on getting you in with a neuropsychiatrist for consultation for the memory loss.  They can help [place] determine what might actually be causing this especially if the blood work and MRI are normal.  Once we get the testing results back from the neuropsychiatrist we will give you a call and schedule a follow-up appointment.

## 2020-02-14 NOTE — Assessment & Plan Note (Signed)
She has had more frequent headaches.

## 2020-02-14 NOTE — Assessment & Plan Note (Signed)
Follows with psychiatry.  No recent medication changes.

## 2020-02-14 NOTE — Assessment & Plan Note (Addendum)
She has some very legitimate memory concerns that seem to be more persistent over the last year.  Mini-Mental status exam score of 30 out of 30.  With completing high school and 1 year of college.  Please see scanned document.  Order do some additional labs to rule out deficiencies, move forward with brain MRI for further work-up and refer for neuropsychiatric evaluation and testing.  Consider medication side effect.  Check CMP, lipid, CBC, TSH, B12, B6 and B1.  Also check hormone levels since she is complaining of hot flashes at night.  She says even now she is concerned that she may not remember this conversation and to tell her husband later this evening when he gets home.

## 2020-02-15 LAB — CBC
HCT: 39.9 % (ref 35.0–45.0)
Hemoglobin: 13.7 g/dL (ref 11.7–15.5)
MCH: 32.7 pg (ref 27.0–33.0)
MCHC: 34.3 g/dL (ref 32.0–36.0)
MCV: 95.2 fL (ref 80.0–100.0)
MPV: 8.9 fL (ref 7.5–12.5)
Platelets: 299 10*3/uL (ref 140–400)
RBC: 4.19 10*6/uL (ref 3.80–5.10)
RDW: 13.1 % (ref 11.0–15.0)
WBC: 9.1 10*3/uL (ref 3.8–10.8)

## 2020-02-15 LAB — COMPLETE METABOLIC PANEL WITH GFR
AG Ratio: 2.4 (calc) (ref 1.0–2.5)
ALT: 10 U/L (ref 6–29)
AST: 14 U/L (ref 10–35)
Albumin: 4.6 g/dL (ref 3.6–5.1)
Alkaline phosphatase (APISO): 58 U/L (ref 37–153)
BUN: 14 mg/dL (ref 7–25)
CO2: 26 mmol/L (ref 20–32)
Calcium: 9.8 mg/dL (ref 8.6–10.4)
Chloride: 105 mmol/L (ref 98–110)
Creat: 0.75 mg/dL (ref 0.50–0.99)
GFR, Est African American: 100 mL/min/{1.73_m2} (ref >=60)
GFR, Est Non African American: 87 mL/min/{1.73_m2} (ref >=60)
Globulin: 1.9 g/dL (calc) (ref 1.9–3.7)
Glucose, Bld: 91 mg/dL (ref 65–99)
Potassium: 4 mmol/L (ref 3.5–5.3)
Sodium: 140 mmol/L (ref 135–146)
Total Bilirubin: 0.7 mg/dL (ref 0.2–1.2)
Total Protein: 6.5 g/dL (ref 6.1–8.1)

## 2020-02-15 LAB — TSH: TSH: 2.02 mIU/L (ref 0.40–4.50)

## 2020-02-15 LAB — LIPID PANEL
Cholesterol: 191 mg/dL (ref <200)
HDL: 61 mg/dL (ref >=50)
LDL Cholesterol (Calc): 109 mg/dL (calc) — ABNORMAL HIGH
Non-HDL Cholesterol (Calc): 130 mg/dL (calc) — ABNORMAL HIGH (ref <130)
Total CHOL/HDL Ratio: 3.1 (calc) (ref <5.0)
Triglycerides: 107 mg/dL (ref <150)

## 2020-02-15 LAB — ESTRADIOL: Estradiol: <15 pg/mL

## 2020-02-15 LAB — LUTEINIZING HORMONE: LH: 41 m[IU]/mL

## 2020-02-15 LAB — PROGESTERONE: Progesterone: <0.5 ng/mL

## 2020-02-15 LAB — FOLLICLE STIMULATING HORMONE: FSH: 64.5 m[IU]/mL

## 2020-02-19 ENCOUNTER — Ambulatory Visit (HOSPITAL_BASED_OUTPATIENT_CLINIC_OR_DEPARTMENT_OTHER)
Admission: RE | Admit: 2020-02-19 | Discharge: 2020-02-19 | Disposition: A | Discharge status: Home / Self Care | Payer: Managed Care, Other (non HMO) | Source: Ambulatory Visit | Attending: Family Medicine | Admitting: Family Medicine

## 2020-02-19 ENCOUNTER — Other Ambulatory Visit: Payer: Self-pay

## 2020-02-19 DIAGNOSIS — G43009 Migraine without aura, not intractable, without status migrainosus: Secondary | ICD-10-CM | POA: Diagnosis not present

## 2020-02-19 DIAGNOSIS — R413 Other amnesia: Secondary | ICD-10-CM | POA: Insufficient documentation

## 2020-02-19 MED ORDER — GADOBUTROL 1 MMOL/ML IV SOLN
6.7000 mL | Freq: Once | INTRAVENOUS | Status: AC | PRN
  Administered 2020-02-19: 6.7 mL via INTRAVENOUS

## 2020-02-23 LAB — VITAMIN D 25 HYDROXY (VIT D DEFICIENCY, FRACTURES): Vit D, 25-Hydroxy: 103 ng/mL — ABNORMAL HIGH (ref 30–100)

## 2020-02-23 LAB — VITAMIN B12: Vitamin B-12: 553 pg/mL (ref 200–1100)

## 2020-02-23 LAB — VITAMIN B1: Vitamin B1 (Thiamine): 28 nmol/L (ref 8–30)

## 2020-02-23 LAB — IRON,TIBC AND FERRITIN PANEL
%SAT: 31 % (calc) (ref 16–45)
Ferritin: 55 ng/mL (ref 16–232)
Iron: 103 ug/dL (ref 45–160)
TIBC: 328 mcg/dL (calc) (ref 250–450)

## 2020-02-23 LAB — VITAMIN B6: Vitamin B6: 33.1 ng/mL — ABNORMAL HIGH (ref 2.1–21.7)

## 2020-03-21 ENCOUNTER — Other Ambulatory Visit: Payer: Self-pay

## 2020-03-21 ENCOUNTER — Ambulatory Visit: Payer: Managed Care, Other (non HMO) | Admitting: Sports Medicine

## 2020-03-21 ENCOUNTER — Other Ambulatory Visit: Payer: Self-pay | Admitting: Family Medicine

## 2020-03-21 ENCOUNTER — Encounter: Payer: Managed Care, Other (non HMO) | Admitting: Psychology

## 2020-03-21 ENCOUNTER — Other Ambulatory Visit: Payer: Self-pay | Admitting: Physician Assistant

## 2020-03-21 ENCOUNTER — Encounter: Payer: Self-pay | Admitting: Sports Medicine

## 2020-03-21 ENCOUNTER — Ambulatory Visit (INDEPENDENT_AMBULATORY_CARE_PROVIDER_SITE_OTHER): Payer: Managed Care, Other (non HMO)

## 2020-03-21 DIAGNOSIS — R2241 Localized swelling, mass and lump, right lower limb: Secondary | ICD-10-CM

## 2020-03-21 DIAGNOSIS — G2581 Restless legs syndrome: Secondary | ICD-10-CM

## 2020-03-21 DIAGNOSIS — W109XXA Fall (on) (from) unspecified stairs and steps, initial encounter: Secondary | ICD-10-CM

## 2020-03-21 DIAGNOSIS — M79671 Pain in right foot: Secondary | ICD-10-CM

## 2020-03-21 DIAGNOSIS — S99921A Unspecified injury of right foot, initial encounter: Secondary | ICD-10-CM | POA: Diagnosis not present

## 2020-03-21 DIAGNOSIS — I471 Supraventricular tachycardia: Secondary | ICD-10-CM

## 2020-03-21 MED ORDER — HYDROCODONE-ACETAMINOPHEN 10-325 MG PO TABS: 1.0000 | ORAL_TABLET | Freq: Three times a day (TID) | ORAL | 0 refills | Status: DC | PRN

## 2020-03-21 NOTE — Assessment & Plan Note (Addendum)
Blacked out and fell down the stairs about a week ago, now has pain, swelling, bruising, predominantly over the fourth MTP as well as the calcaneocuboid articulation, I think she has fractures in both of these locations, adding x-rays, high-dose hydrocodone, cam boot. Return to see me in 2 weeks.  She is seeing a neurologist later today and she will discuss syncope with her PCP as well.  No chest pain today.

## 2020-03-21 NOTE — Progress Notes (Addendum)
    Procedures performed today:    None.  Independent interpretation of notes and tests performed by another provider:   X-rays personally reviewed, there is definitely an irregularity at the base of the fourth proximal phalanx consistent with a fracture, likely starting to heal, no change in plan.  Brief History, Exam, Impression, and Recommendations:    Right foot injury Blacked out and fell down the stairs about a week ago, now has pain, swelling, bruising, predominantly over the fourth MTP as well as the calcaneocuboid articulation, I think she has fractures in both of these locations, adding x-rays, high-dose hydrocodone, cam boot. Return to see me in 2 weeks.  She is seeing a neurologist later today and she will discuss syncope with her PCP as well.  No chest pain today.    ___________________________________________ Ihor Austin. Benjamin Stain, M.D., ABFM., CAQSM. Primary Care and Sports Medicine [facility]  Adjunct Instructor of Family Medicine  [employer]

## 2020-04-04 ENCOUNTER — Ambulatory Visit: Payer: Managed Care, Other (non HMO) | Admitting: Sports Medicine

## 2020-04-05 ENCOUNTER — Encounter: Payer: Managed Care, Other (non HMO) | Admitting: Psychology

## 2020-04-11 ENCOUNTER — Ambulatory Visit (INDEPENDENT_AMBULATORY_CARE_PROVIDER_SITE_OTHER): Payer: Managed Care, Other (non HMO) | Admitting: Sports Medicine

## 2020-04-11 ENCOUNTER — Other Ambulatory Visit: Payer: Self-pay

## 2020-04-11 DIAGNOSIS — S99921D Unspecified injury of right foot, subsequent encounter: Secondary | ICD-10-CM | POA: Diagnosis not present

## 2020-04-11 NOTE — Progress Notes (Signed)
    Procedures performed today:    None.  Independent interpretation of notes and tests performed by another provider:   None.  Brief History, Exam, Impression, and Recommendations:    Right foot injury This pleasant 60 year old female returns, she blacked out and fell down the stairs about 3 weeks ago, she had pain, swelling, bruising, predominantly over the fourth MTP as well as the calcaneocuboid articulation, we obtained x-rays that did show a deformity at the base of the fourth proximal phalanx, we placed her in a boot. She is doing a good amount better now 3 weeks post injury. 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 Raeford Razor. If insufficient improvement over the dorsal midfoot after a solid month we will consider injection into the intertarsal joints. I have also advised her to avoid barefoot walking around the house and get some cushioned slippers.    ___________________________________________ Gwen Her. Dianah Field, M.D., ABFM., CAQSM. Primary Care and [place] Instructor of [place] [facility] [employer]

## 2020-04-11 NOTE — Assessment & Plan Note (Signed)
This pleasant 60 year old female returns, she blacked out and fell down the stairs about 3 weeks ago, she had pain, swelling, bruising, predominantly over the fourth MTP as well as the calcaneocuboid articulation, we obtained x-rays that did show a deformity at the base of the fourth proximal phalanx, we placed her in a boot. She is doing a good amount better now 3 weeks post injury. 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 Raeford Razor. If insufficient improvement over the dorsal midfoot after a solid month we will consider injection into the intertarsal joints. I have also advised her to avoid barefoot walking around the house and get some cushioned slippers.

## 2020-04-13 ENCOUNTER — Other Ambulatory Visit: Payer: Self-pay | Admitting: Family Medicine

## 2020-04-13 ENCOUNTER — Other Ambulatory Visit: Payer: Self-pay

## 2020-04-13 ENCOUNTER — Encounter: Payer: Managed Care, Other (non HMO) | Admitting: Family Medicine

## 2020-04-13 ENCOUNTER — Encounter: Payer: Managed Care, Other (non HMO) | Attending: Psychology | Admitting: Psychology

## 2020-04-13 ENCOUNTER — Encounter: Payer: Self-pay | Admitting: Psychology

## 2020-04-13 DIAGNOSIS — R4789 Other speech disturbances: Secondary | ICD-10-CM

## 2020-04-13 DIAGNOSIS — R419 Unspecified symptoms and signs involving cognitive functions and awareness: Secondary | ICD-10-CM

## 2020-04-13 DIAGNOSIS — R413 Other amnesia: Secondary | ICD-10-CM | POA: Diagnosis not present

## 2020-04-13 DIAGNOSIS — H9193 Unspecified hearing loss, bilateral: Secondary | ICD-10-CM | POA: Diagnosis not present

## 2020-04-13 NOTE — Progress Notes (Signed)
NEUROBEHAVIORAL STATUS EXAM   Name: Zamyrah Jasin Date of Birth: Aug 12, 1960 Date of Interview: 04/13/2020  Reason for Referral:  Helenann Vanbergen is a 60 y.o. female who is referred for neuropsychological evaluation by Barbarann Ehlers. Linford Arnold, MD (PCP) due to concerns about her memory and cognitive decline. This patient is accompanied in the office by her husband who supplements the history.   History of Presenting Problem:  Patient describes subjective memory decline that began around 11-12 months ago. He says she will forget conversations that she has had.  She says twice she is actually mixed up her medications and could not remember if she taken them or not taking them.  She is accidentally left things in places that they do not usually go and lost items.  She said that she actually had a brief period when she was driving to [place] recently to visit her mother when she could not quite remember where she was.  She says she has been driving it for 30 years and so it really quite scared her that she could not remember where she was.  She has not noticed any significant difficulty paying bills etc. she does take a multivitamin no prior history of stroke or vascular disease.  She does have significant hearing loss and does not wear hearing aids (onset around 5-6 years ago; at least 40% loss in both ears).  She has had more frequent headaches recently.  She also complains of night sweats though she is postmenopausal.  For her mood and feels like that is actually pretty well controlled right now.  She is adopted so no known history of Alzheimer's in the family. Denies any other neurologic symptoms such as numbness or tingling.  Just the increase in headache frequency recently. No weakness.  Upon direct questioning, the patient reported:   Forgetting recent conversations/events: Yes, more frequent within last year Repeating statements/questions: No not often Misplacing/losing items:  Yes, often   Forgetting appointments or other obligations: No, pretty good  Forgetting to take medications: Get mixed up on occasion. System: morning vitamin on left night medications on right   Difficulty concentrating: On occasion but no significant change Starting but not finishing tasks: No  Distracted easily: No  Processing information more slowly: Mild difficulties   Word-finding difficulty: Significant trouble that began within last year. Word substitutions: Denied  Writing difficulty: Cant form letters (writing cursive is difficult)   Spelling difficulty: "Cant spell my own name right" - "cant form Y"  Comprehension difficulty: No   Getting lost when driving: One recent occasion while driving to [place] to visit mother; could not quite remember where she was. This caused concern given that she has driven same route for 30 years.   Making wrong turns when driving: Denied  Uncertain about directions when driving or passenger: During incident described above. Denied in more familiar places.   Family neuro hx including dementia?: Unknown due adoption   Current Functioning: Work: Futures trader   Complex ADLs Driving: Able to without difficulty  Medication management: Management of finances: Husband took over 3-4 years ago. Needed more oversight.  Appointments: Use planner  Cooking: Able to perform IND   Medical/Physical complaints:   Any hx of stroke/TIA, MI, LOC/TBI, Sz? Concussion as child.  Hx falls? 2 recent falls. Reportedly "blacked out" both times.  Balance, probs walking? Reduced balance and coordination.  Sleep: Insomnia? OSA? CPAP? REM sleep beh sx? Husband reports that she laughs often when dreaming  Visual illusions/hallucinations? At least once  daily for past year. Describes seeing animals, vehicles, flags, and other objects that others don's appear to perceive. Has not reported this to husband (until now) or family/friends due to fear of their reaction and not wanting to cause  more concern.  Appetite/Nutrition/Weight changes: Poor appetite Current mood: Anxious and stressed Behavioral disturbance/Personality change: Denied  Suicidal Ideation/Intention: Denied current ideation, plan, or intent.   Psychiatric History: History of depression, anxiety, other MH disorder: Had "nervous break down" around 4-5 years ago related to marital dispute and psychiatrically hospitalized 10 days for suicidal/homicidal thoughts. Received diagnosis of bipolar disorder during hospitalization.   History of MH treatment: Individual counseling (4 yrs.) and antidepressant medications (I.e,, Lamotrigine and Prozac)  History of SI: 4-5 years ago (see above)  History of substance dependence/treatment: Denied   Social History: Born/Raised: [place], [place]. Adopted  Education: 13. Had some trouble early on and almost repeated 2nd grade. Performance in reading, comprehension, and language arts was relatively weak compared to mathematics.  Occupational history: Haematologist (6 yrs.), Retail (e.g., [employer], [employer], etc.)  Marital history: married 24 years  Children: 5  Alcohol: Endorsed heavy drinking in past but No current use  Tobacco: around 1 pack per day SA: Denied   Medical History: Past Medical History:  Diagnosis Date  . Fibroid uterus   . PUD (peptic ulcer disease)     Current Medications:  Outpatient Encounter Medications as of 04/13/2020  Medication Sig  . Ascorbic Acid (VITAMIN C) 1000 MG tablet Take by mouth.  . BYSTOLIC 2.5 MG tablet TAKE 1 TABLET BY MOUTH EVERY DAY  . CALCIUM-MAGNESIUM-ZINC PO Take by mouth.  . Cholecalciferol (VITAMIN D3) 5000 units CAPS Take by mouth.  . clobetasol ointment (TEMOVATE) 0.05 % Apply to palms of hands QD prn.  . cyclobenzaprine (FLEXERIL) 10 MG tablet Take 1 tablet (10 mg total) by mouth 3 (three) times daily as needed for muscle spasms.  Marland Kitchen dicyclomine (BENTYL) 10 MG capsule TAKE 1 CAPSULE (10 MG TOTAL) BY MOUTH 4 (FOUR) TIMES  DAILY - BEFORE MEALS AND AT BEDTIME.  . Galcanezumab-gnlm (EMGALITY) 120 MG/ML SOAJ Inject 240 mg into the skin as directed AND 120 mg every 30 (thirty) days. Inj 240mg  once then 120mg  monthly.  Marland Kitchen HYDROcodone-acetaminophen (NORCO) 10-325 MG tablet Take 1 tablet by mouth every 8 (eight) hours as needed.  Marland Kitchen ketoconazole (NIZORAL) 2 % shampoo Apply 1 application topically 2 (two) times a week.  . lamoTRIgine (LAMICTAL) 200 MG tablet Take 200 mg by mouth daily.  . Multiple Vitamin (THERA) TABS Take by mouth.  . Omega-3 Fatty Acids (FISH OIL) 1000 MG CAPS Take by mouth.  . rizatriptan (MAXALT) 10 MG tablet TAKE 1 TABLET BY MOUTH AS NEEDED FOR MIGRAINE. MAY REPEAT IN 2 HOURS IF NEEDED  . rOPINIRole (REQUIP) 1 MG tablet Take 1 tablet (1 mg total) by mouth at bedtime. Ok to take extra tab QD prn.  . terbinafine (LAMISIL) 250 MG tablet TAKE 1 TABLET BY MOUTH EVERY DAY  . traZODone (DESYREL) 150 MG tablet TAKE 1 TABLET BY MOUTH EVERYDAY AT BEDTIME  . VITAMIN E PO Take by mouth.   No facility-administered encounter medications on file as of 04/13/2020.   Behavioral Observations:   Appearance: Casually and appropriately dressed and groomed Gait: Ambulated independently, no gross abnormalities observed Speech: Fluent; normal rate, rhythm and volume. Moderate word finding difficulty. Thought process: disorganized and concrete  Affect: Blunted, anxious Interpersonal: Pleasant, appropriate  60  minutes spent face-to-face with patient completing  neurobehavioral status exam. 60 minutes spent integrating medical records/clinical data and completing this report. [id number] unit; [id number].  TESTING: There is medical necessity to proceed with neuropsychological assessment as the results will be used to aid in differential diagnosis and clinical decision-making and to inform specific treatment recommendations. Per the patient, medical records reviewed, there has been a change in cognitive functioning and a  reasonable suspicion of a neurocognitive disorder.   Clinical Decision Making: In considering the patient's current level of functioning, level of presumed impairment, nature of symptoms, emotional and behavioral responses during the interview, level of literacy, and observed level of motivation, a battery of tests was selected to be administered during separate 4-hour testing appointment.    PLAN: The patient will return to complete the above referenced full battery of neuropsychological testing with this provider. Education regarding testing procedures was provided to the patient. Subsequently, the patient will see this provider for a follow-up session at which time her test performances and my impressions and treatment recommendations will be reviewed in detail.   Evaluation ongoing; full report to follow.

## 2020-04-16 ENCOUNTER — Other Ambulatory Visit: Payer: Self-pay | Admitting: Physician Assistant

## 2020-04-19 ENCOUNTER — Ambulatory Visit: Payer: Managed Care, Other (non HMO) | Admitting: Family Medicine

## 2020-04-19 ENCOUNTER — Other Ambulatory Visit: Payer: Self-pay

## 2020-04-19 DIAGNOSIS — S99921D Unspecified injury of right foot, subsequent encounter: Secondary | ICD-10-CM | POA: Diagnosis not present

## 2020-04-19 NOTE — Assessment & Plan Note (Signed)
Having dorsal sided pain from her initial trauma.  Likely contusion in nature. -Counseled on supportive care. -Orthotics.

## 2020-04-19 NOTE — Progress Notes (Signed)
  Anne Mcdonald - 60 y.o. female MRN [id number]  Date of birth: 01-09-1961  SUBJECTIVE:  Including CC & ROS.  No chief complaint on file.   [name] is a 60 y.o. female that is presenting with right foot pain.  Pain is been ongoing for a few weeks after initial injury.   Review of Systems See HPI   HISTORY: Past Medical, Surgical, Social, and Family History Reviewed & Updated per EMR.   Pertinent Historical Findings include:  Past Medical History:  Diagnosis Date  . Fibroid uterus   . PUD (peptic ulcer disease)     Past Surgical History:  Procedure Laterality Date  . CESAREAN SECTION    . CHOLECYSTECTOMY      Family History  Adopted: Yes  Family history unknown: Yes    Social History   Socioeconomic History  . Marital status: Married    Spouse name: Not on file  . Number of children: Not on file  . Years of education: Not on file  . Highest education level: Not on file  Occupational History  . Not on file  Tobacco Use  . Smoking status: Current Every Day Smoker    Packs/day: 1.00    Types: Cigarettes  . Smokeless tobacco: Never Used  Vaping Use  . Vaping Use: Never used  Substance and Sexual Activity  . Alcohol use: No  . Drug use: No  . Sexual activity: Not Currently    Birth control/protection: None, Surgical  Other Topics Concern  . Not on file  Social History Narrative  . Not on file   Social Determinants of Health   Financial Resource Strain: Not on file  Food Insecurity: Not on file  Transportation Needs: Not on file  Physical Activity: Not on file  Stress: Not on file  Social Connections: Not on file  Intimate Partner Violence: Not on file     PHYSICAL EXAM:  VS: BP (!) 80/58   Ht 5\' 4"  (1.626 m)   Wt 145 lb (65.8 kg)   LMP 07/17/2016   BMI 24.89 kg/m  Physical Exam Gen: NAD, alert, cooperative with exam, well-appearing MSK:  Right foot: Fairly cavus foot. Some loss of the transverse arch. Tenderness palpation of  the dorsum of the foot. Neurovascularly intact  Patient was fitted for a standard, cushioned, semi-rigid orthotic. The orthotic was heated and afterward the patient stood on the orthotic blank positioned on the orthotic stand. The patient was positioned in subtalar neutral position and 10 degrees of ankle dorsiflexion in a weight bearing stance. After completion of molding, a stable base was applied to the orthotic blank. The blank was ground to a stable position for weight bearing. Size: 22F Pairs: 2 Base: Blue EVA Additional Posting and Padding: None The patient ambulated these, and they were very comfortable.    ASSESSMENT & PLAN:   Right foot injury Having dorsal sided pain from her initial trauma.  Likely contusion in nature. -Counseled on supportive care. -Orthotics.

## 2020-05-09 ENCOUNTER — Other Ambulatory Visit: Payer: Self-pay

## 2020-05-09 ENCOUNTER — Ambulatory Visit (INDEPENDENT_AMBULATORY_CARE_PROVIDER_SITE_OTHER): Payer: Managed Care, Other (non HMO) | Admitting: Sports Medicine

## 2020-05-09 DIAGNOSIS — S99921D Unspecified injury of right foot, subsequent encounter: Secondary | ICD-10-CM

## 2020-05-09 NOTE — Progress Notes (Signed)
    Procedures performed today:    None.  Independent interpretation of notes and tests performed by another provider:   None.  Brief History, Exam, Impression, and Recommendations:    Right foot injury This is a pleasant 60 year old female, just over 2 months ago she blacked out, she fell and injured her foot, she had some bruising and pain at the calcaneocuboid articulation, we got her custom orthotics, she wore a boot for a while and feels significantly better, still has a bit of discomfort at the base of the fourth and fifth metatarsals, I did remove the sock liners, her shoes were too tight with the orthotic. I had like to see her back in about 6 weeks to see how things are going at which point we can consider an injection at the base of the fourth metatarsal and the tarsal articulation.     ___________________________________________ Gwen Her. Dianah Field, M.D., ABFM., CAQSM. Primary Care and [place] Instructor of [place] [facility] [employer] Medicine

## 2020-05-09 NOTE — Assessment & Plan Note (Signed)
This is a pleasant 60 year old female, just over 2 months ago she blacked out, she fell and injured her foot, she had some bruising and pain at the calcaneocuboid articulation, we got her custom orthotics, she wore a boot for a while and feels significantly better, still has a bit of discomfort at the base of the fourth and fifth metatarsals, I did remove the sock liners, her shoes were too tight with the orthotic. I had like to see her back in about 6 weeks to see how things are going at which point we can consider an injection at the base of the fourth metatarsal and the tarsal articulation.

## 2020-06-13 ENCOUNTER — Other Ambulatory Visit: Payer: Self-pay | Admitting: Family Medicine

## 2020-06-13 DIAGNOSIS — I471 Supraventricular tachycardia: Secondary | ICD-10-CM

## 2020-06-19 ENCOUNTER — Encounter: Payer: Managed Care, Other (non HMO) | Attending: Psychology | Admitting: Psychology

## 2020-06-19 ENCOUNTER — Encounter: Payer: Self-pay | Admitting: Psychology

## 2020-06-19 ENCOUNTER — Other Ambulatory Visit: Payer: Self-pay

## 2020-06-19 DIAGNOSIS — F0150 Vascular dementia without behavioral disturbance: Secondary | ICD-10-CM | POA: Insufficient documentation

## 2020-06-19 DIAGNOSIS — F319 Bipolar disorder, unspecified: Secondary | ICD-10-CM | POA: Insufficient documentation

## 2020-06-19 DIAGNOSIS — F17200 Nicotine dependence, unspecified, uncomplicated: Secondary | ICD-10-CM | POA: Diagnosis present

## 2020-06-19 DIAGNOSIS — R4789 Other speech disturbances: Secondary | ICD-10-CM | POA: Diagnosis not present

## 2020-06-19 DIAGNOSIS — H9193 Unspecified hearing loss, bilateral: Secondary | ICD-10-CM | POA: Insufficient documentation

## 2020-06-19 DIAGNOSIS — G43009 Migraine without aura, not intractable, without status migrainosus: Secondary | ICD-10-CM | POA: Diagnosis present

## 2020-06-19 DIAGNOSIS — R413 Other amnesia: Secondary | ICD-10-CM | POA: Diagnosis not present

## 2020-06-19 NOTE — Progress Notes (Signed)
   Neuropsychology Note Anne Mcdonald completed 240 minutes of neuropsychological testing with this provider Health and safety inspector). The patient did appear overtly distressed by the testing session at times, per behavioral observation and via self-report. She was highly self-critical and denigrating. Rest breaks were offered. Effort and motivation were good and she persisted well overall.   Clinical Decision Making: In considering the patient's current level of functioning, level of presumed impairment, nature of symptoms, emotional and behavioral responses during the interview, level of literacy, and observed level of motivation/effort, a battery of tests was selected. Changes were made as deemed necessary based on patient performance on testing, behavioral observations and additional pertinent factors such as those listed above.  Tests Administered:  Beck Depression Inventory, 2nd Edition (BDI-II)  Boston Naming Test (BNT)  [place] Verbal Learning Test, 3rd Edition (CVLT-3): Standard Version  Controlled Oral Word Association Test (COWAT; FAS & Animals)    Line Orientation (RBANS-Form C)  Trail Making Test (TMT)  Rey Complex Figure Test (RCFT)  Wechsler Adult Intelligence Test, 4th Edition (WAIS-IV)   Wechsler Memory Scale, 4th Edition (WMS-IV): Adult Battery; Select subtests  [employer] (WCST)  Word Reading (WRAT-5)  Results: To be included once scored.   Feedback to Patient:  Anne Mcdonald will return on 06/27/20 for an interactive feedback session with this provider at which time her test performances, clinical impressions and treatment recommendations will be reviewed in detail. The patient understands she can contact our office should she require our assistance before this time.  Full report to follow.

## 2020-06-21 ENCOUNTER — Encounter (HOSPITAL_BASED_OUTPATIENT_CLINIC_OR_DEPARTMENT_OTHER): Payer: Managed Care, Other (non HMO) | Admitting: Psychology

## 2020-06-21 ENCOUNTER — Other Ambulatory Visit: Payer: Self-pay

## 2020-06-21 DIAGNOSIS — H9193 Unspecified hearing loss, bilateral: Secondary | ICD-10-CM | POA: Diagnosis not present

## 2020-06-21 DIAGNOSIS — F0150 Vascular dementia without behavioral disturbance: Secondary | ICD-10-CM

## 2020-06-21 DIAGNOSIS — F17200 Nicotine dependence, unspecified, uncomplicated: Secondary | ICD-10-CM

## 2020-06-21 DIAGNOSIS — G43009 Migraine without aura, not intractable, without status migrainosus: Secondary | ICD-10-CM | POA: Diagnosis not present

## 2020-06-21 DIAGNOSIS — G3184 Mild cognitive impairment, so stated: Secondary | ICD-10-CM | POA: Diagnosis not present

## 2020-06-21 DIAGNOSIS — R413 Other amnesia: Secondary | ICD-10-CM | POA: Diagnosis not present

## 2020-06-21 DIAGNOSIS — F319 Bipolar disorder, unspecified: Secondary | ICD-10-CM

## 2020-06-21 DIAGNOSIS — F330 Major depressive disorder, recurrent, mild: Secondary | ICD-10-CM

## 2020-06-22 ENCOUNTER — Encounter: Payer: Self-pay | Admitting: Psychology

## 2020-06-22 NOTE — Progress Notes (Addendum)
NEUROPSYCHOLOGICAL EVALUATION   Name:    Anne Mcdonald  Date of Birth:   1960/11/09 Date of Interview:  04/13/20 Date of Testing:  06/19/20  Date of Feedback:  06/27/20     Background Information:  Reason for Referral:  Anne Mcdonald is a 60 y.o. female referred by Rene Kocher. Metheney, MD (PCP) to assess her current level of cognitive functioning and assist in differential diagnosis. The current evaluation consisted of a review of available medical records, an interview with the patient and her husband, and the completion of a neuropsychological testing battery. Informed consent was obtained.  History of Presenting Problem:  Patient describes subjective memory decline that began around 11-12 months ago. He says she will forget conversations that she has had. She says twice she is actually mixed up her medications and could not remember if she taken them or not taking them. She is accidentally left things in places that they do not usually go and lost items. She said that she actually had a brief period when she was driving to [place] recently to visit her mother when she could not quite remember where she was. She says she has been driving it for 30 years and so it really quite scared her that she could not remember where she was. She has not noticed any significant difficulty paying bills etc. she does take a multivitamin no prior history of stroke or vascular disease. She does have significant hearing loss and does not wear hearing aids (onset around 5-6 years ago; at least 40% loss in both ears). She has had more frequent headaches recently. She also complains of night sweats though she is postmenopausal. For her mood and feels like that is actually pretty well controlled right now. She is adopted so no known history of Alzheimer's in the family. Denies any other neurologic symptoms such as numbness or tingling. Just the increase in headache frequency recently. No  weakness.  Review of Records  PHQ-9 was a 17 on 03/04/2013 - Treated with fluoxetine (40mg ) at time.   On 08/30/20, complained of lightheadedness after working outside in the heat at work [employer]) to Dr. Madilyn Fireman; off iron for a week.   Diagnosed with chronic constipation on 04/20/2014.   On 12/22/2014, complained of a headache that lasted over a month. Described as starting in the back of her head and traversing to the front. Compliant with Topamax. Patient felt there was a link between bowels and migraines.   Blacked out and fell down the stairs around 02/2020 that caused deformity at the base of the fourth proximal phalanx  Psychiatric History: History of depression, anxiety, other MH disorder: Had "nervous break down" circa 2018 related to marital dispute and psychiatrically hospitalized 10 days for suicidal/homicidal thoughts. Received diagnosis of bipolar disorder during hospitalization.  History of MH treatment: Individual counseling (4 yrs.) and antidepressant medications (I.e,, Lamotrigine and Prozac)  History of SI: 4-5 years ago (see above)  History of substance dependence/treatment: Denied   Social History: Born/Raised: [place], [place]. Adopted  Education: 13. Had some trouble early on and almost repeated 2nd grade. Performance in reading, comprehension, and language arts was relatively weak compared to mathematics.  Occupational history: Secretary/administrator (6 yrs.), Retail (e.g., [employer], [employer], etc.)  Marital history: married 24 years  Children: 5  Alcohol: Endorsed heavy drinking in past but No current use  Tobacco: around 1 pack per day SA: Denied   Medical History:  Past Medical History:  Diagnosis Date  .  Fibroid uterus   . PUD (peptic ulcer disease)    Imaging: MRI of the brain with and without contrast on 02/18/21 showed the following impressions:  1. No acute intracranial abnormality or abnormal contrast enhancement. 2. 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 3. Few punctate foci of T2 hyperintensity within the white matter of the cerebral hemispheres, nonspecific but may represent mild chronic microvascular ischemic changes.  MRI of lumbar spine on 12/22/19 showed the following impressions:  1. Severe disc space narrowing at L5-S1 with mild vacuum phenomenon. 2. 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 Other levels appear unremarkable.  3. No fracture or spondylolisthesis.  Current medications:  Outpatient Encounter Medications as of 06/21/2020  Medication Sig  . Ascorbic Acid (VITAMIN C) 1000 MG tablet Take by mouth.  Marland Kitchen CALCIUM-MAGNESIUM-ZINC PO Take by mouth.  . Cholecalciferol (VITAMIN D3) 5000 units CAPS Take by mouth.  . clobetasol ointment (TEMOVATE) 0.05 % Apply to palms of hands QD prn.  . cyclobenzaprine (FLEXERIL) 10 MG tablet Take 1 tablet (10 mg total) by mouth 3 (three) times daily as needed for muscle spasms.  Marland Kitchen dicyclomine (BENTYL) 10 MG capsule TAKE 1 CAPSULE (10 MG TOTAL) BY MOUTH 4 (FOUR) TIMES DAILY - BEFORE MEALS AND AT BEDTIME.  . Galcanezumab-gnlm (EMGALITY) 120 MG/ML SOAJ Inject 240 mg into the skin as directed AND 120 mg every 30 (thirty) days. Inj 240mg  once then 120mg  monthly.  Marland Kitchen HYDROcodone-acetaminophen (NORCO) 10-325 MG tablet Take 1 tablet by mouth every 8 (eight) hours as needed.  Marland Kitchen ketoconazole (NIZORAL) 2 % shampoo Apply 1 application topically 2 (two) times a week.  . lamoTRIgine (LAMICTAL) 200 MG tablet Take 200 mg by mouth daily.  . Multiple Vitamin (THERA) TABS Take by mouth.  . nebivolol (BYSTOLIC) 2.5 MG tablet TAKE 1 TABLET BY MOUTH EVERY DAY  . Omega-3 Fatty Acids (FISH OIL) 1000 MG CAPS Take by mouth.  . rizatriptan (MAXALT) 10 MG tablet TAKE 1 TABLET BY MOUTH AS NEEDED FOR MIGRAINE. MAY REPEAT IN 2 HOURS IF NEEDED  . rOPINIRole (REQUIP) 1 MG tablet Take 1  tablet (1 mg total) by mouth at bedtime. Ok to take extra tab QD prn.  . terbinafine (LAMISIL) 250 MG tablet TAKE 1 TABLET BY MOUTH EVERY DAY  . traZODone (DESYREL) 150 MG tablet TAKE 1 TABLET BY MOUTH EVERYDAY AT BEDTIME  . VITAMIN E PO Take by mouth.   No facility-administered encounter medications on file as of 06/21/2020.   Current Examination:  Behavioral Observations:   Appearance: Casually and appropriately dressed and groomed Gait: Ambulated independently, no gross abnormalities observed Speech: Fluent; normal rate, rhythm and volume. Moderate word finding difficulty. Thought process: disorganized and concrete  Affect: Blunted, anxious Interpersonal: Pleasant, appropriate Orientation: Oriented to all spheres. Accurately named the current President and his predecessor.   Anne Mcdonald completed 240 minutes of neuropsychological testing with this provider. The patient did appear overtly distressed by the testing session at times, per behavioral observation and via self-report. She was highly self-critical and denigrating. Rest breaks were offered. Effort and motivation were good and she persisted well overall.   Clinical Decision Making: In considering the patient's current level of functioning, level of presumed impairment, nature of symptoms, emotional and behavioral responses during the interview, level of literacy, and observed level of motivation/effort, a battery of tests was selected. Changes were made as deemed necessary  based on patient performance on testing, behavioral observations and additional pertinent factors such as those listed above.  Tests Administered:  Beck Depression Inventory, 2nd Edition (BDI-II)  [place] Naming Test (BNT)  [place] Verbal Learning Test, 3rd Edition (CVLT-3): Standard Version  Controlled Oral Word Association Test (COWAT; FAS & Animals)    Line Orientation (RBANS-Form C)  Trail Making Test (TMT)  Rey Complex Figure Test  (RCFT)  Wechsler Adult Intelligence Test, 4th Edition (WAIS-IV)   Wechsler Memory Scale, 4th Edition (WMS-IV): Adult Battery; Select subtests  [employer] (WCST)  Word Reading (WRAT-5)  Test Results: Note: Standardized scores are presented only for use by appropriately trained professionals and to allow for any future test-retest comparison. These scores should not be interpreted without consideration of all the information that is contained in the rest of the report. The most recent standardization samples from the test publisher or other sources were used whenever possible to derive standard scores; scores were corrected for age, gender, ethnicity and education when available.   Test Scores & Description of Results:  Scores on objective and embedded measures of performance validity were within normal limits. However, there were some behavioral manifestations that suggested poor test engagement on a test of higher order problem solving. While her performance on this measure was significantly below expectation, it is consistent with collateral informant's subjective reports and the patient's constellation of symptoms. In addition, similar patterns of performance are often reflected in individuals with genuine cognitive impairment.  Therefore, it is likely that the above mentioned performance was more reflective of actual impairment than of suboptimal effort.  Thus, the current below findings are believed to be an accurate representation of the patient's current level of cognitive functioning.  Premorbid Functioning: Premorbid verbal intellectual abilities were estimated to have been within the average range based on a test of word reading (SS=95, 37th percentile).   Intellectual Functioning: The patient produced a full-scale IQ score of 88 which falls at the 21st percentile and is in the below average range.  This is below predicted levels based on the patient's educational and  occupational history and does suggest 1 or more domains are currently being adversely impacted. The patient produced a slightly weaker score on the general abilities index (GAI=81, 10th percentile). This measure places less emphasis on the most acutely impacted measures and this comprehensive battery of cognitive measures, notably working memory and information processing speed variables.  Composite Score Summary  Scale Sum of Scaled Scores Composite Score Percentile Rank 95% Conf. Interval Qualitative Description  Verbal Comprehension 22 VCI 85 16 80-91 Low Average  Perceptual Reasoning 21 PRI 82 12 77-89 Low Average  Working Memory 19 WMI 97 42 90-104 Average  Processing Speed 21 PSI 102 55 93-110 Average  Full Scale 83 FSIQ 88 21 84-92 Low Average  General Ability 43 GAI 81 10 77-87 Low Average   Language: Language abilities were slightly mixed. Specifically, vocabulary knowledge was average for her age (50th percentile) while her ability to answer general knowledge type questions was well below average. Confrontation naming and semantic verbal fluency were below average. Lexical fluency was average. Hearing was impaired bilaterally but more impacted on right side. Auditory comprehension appeared generally intact.   Verbal Comprehension Subtests Summary  Subtest Raw Score Scaled Score Percentile Rank Reference Group Scaled Score SEM  Similarities 19 7 16 7  1.08  Vocabulary 39 10 50 11 0.73  Information 6 5 5 6  0.67   BNT   Total=51/60, T=41,  18th percentile  COWAT  FAS=45, T=51, 54th percentile  Animals= 16, T=40, 16th percentile   Visuospatial/Construction: Control and instrumentation engineer was below average. Visual-spatial construction was below average. Spatial judgement was intact    Perceptual Reasoning Subtests Summary  Subtest Raw Score Scaled Score Percentile Rank Reference Group Scaled Score SEM  Block Design 24 7 16 6  1.04  Matrix Reasoning 7 5 5 3  0.95  Visual Puzzles 11 9  37 7 0.99   Line Orientation (RBANS-C)   Total=19/20, >75th percentile   Attention: Behavioral observations during the current evaluation suggest that the patient had some difficulty maintaining basic auditory attention. Regarding objective assessment of working memory, average performance was found overall but with some variability demonstrated. Specifically, immediate auditory attention was below average and relatively weak compared to working memory capacity and mental sequencing, which were average. Information processing speed was average and relative strength compared to overall verbal and nonverbal abilities.  Her ability to solve word problems requiring basic arithmetic was average.  Her performance on a test of visual working memory was below average and similar to basic auditory attention. This measure required her to temporarily hold and manipulate spatial locations and visual details.    Working Doctor, general practice Raw Score Scaled Score Percentile Rank Reference Group Scaled Score SEM  Digit Span 23 8 25 7  0.85  Arithmetic 16 11 63 11 1.04   Working Memory Process Score Summary  Process Score Raw Score Scaled Score Percentile Rank Base Rate SEM  Digit Span Forward 7 7 16  -- 1.44  Digit Span Backward 8 10 50 -- 1.27  Digit Span Sequencing 8 10 50 -- 1.37  Longest Digit Span Forward 5 -- -- 94.5 --  Longest Digit Span Backward 4 -- -- 79.5 --  Longest Digit Span Sequence 6 -- -- 63.0 --   WMS-IV Subtest Domain Raw Score Scaled Score Percentile Rank  Symbol Span VWM 14 7 16    Processing Speed: Psychomotor speed involving simple sequencing and visual scanning was average.   Processing Speed Subtests Summary  Subtest Raw Score Scaled Score Percentile Rank Reference Group Scaled Score SEM  Symbol Search 28 10 50 8 1.31  Coding 64 11 63 8 0.99   TMT  Part A: 28", T=54, 66th percentile   Executive Functions: More complex sequencing requiring divided  attention, mental flexibility, and set shifting was below average. The patient's ability to identify, maintain, and shift problem solving strategies in response to examiner feedback was exceptionally low. She showed evidence of significant perseveration and poor distress tolerance. Verbal fluency with phonemic search restrictions was average. Verbal abstract reasoning was below average. Non-verbal abstract reasoning was well below average. Performance on a clock drawing task was intact.   TMT  Part B: 90", T=42, 21st percentile    [employer] Test: Raw Score T-Score Percentile    Categories (trials) 0 (128) --- <1 Exceptionally Low  Trials to Complete 1st Category 129 --- <1 Exceptionally Low  Total Errors 92 25 1 Exceptionally Low  % Perseverative Errors 0.52 25 1 Exceptionally Low  % Non-Perseverative Errors 0.20 37 10 Below Average  Failure to Maintain Set 0 --- >16 Average   Learning and Memory: With regard to verbal memory, encoding and acquisition of non-contextual information (i.e., word list) was average. Performance on a second distracter list was average. Short delayed free recall was average, cued recall was average. After a 20 minute delay, free recall was average but significantly improved. Long delayed cued  recall was average. Performance on a yes/no recognition task was average. On another verbal memory test, encoding and acquisition of contextual auditory information (i.e., short stories) was above average. After a delay, free recall was above average. Performance on a yes/no recognition task was above average. With regard to non-verbal memory, immediate and delayed free recall of simple line drawings was average. Recognition for these designs was above average. Immediate recall of a complex visual spatial figure was well below average. Her ability to draw the figure 30 minutes after initial exposure was exceptionally low. Recognition of figure details was excellent.    WMS-IV Primary Subtest Scaled Score Summary  Subtest Domain Raw Score Scaled Score Percentile Rank  Logical Memory I AM 32 13 84  Logical Memory II AM 29 13 84  Visual Reproduction I VM 31 9 37  Visual Reproduction II VM 24 10 50   Auditory Memory Process Score Summary  Process Score Raw Score Scaled Score Percentile Rank Cumulative Percentage (Base Rate)  LM II Recognition 28 - - >75%   Visual Memory Process Score Summary  Process Score Raw Score Scaled Score Percentile Rank Cumulative Percentage (Base Rate)  VR II Recognition 7 - - >75%   Index Sum of scaled scores Index score Percentile rank    Trials 1-5 Correct 52 102 55    Delayed Recall Correct 37 97 42    Total Recall Correct 100 100 50     Rey-Osterrieth Complex Figure Test (RCFT): Raw Score  (T Score) Percentile   Immediate Recall 9.5/36 (35) 7 Well Below Average  Delayed Recall 7/36 (27) 1 Exceptionally Low  Recognition Total Correct 24/24 (74) 99 Exceptionally High  True Positives 12 >16 Within Normal Limits  False Positives 0 >16 Within Normal Limits   Emotional Functioning: On a self-report measure of mood, the patient's responses were not indicative of clinically significant depression at the present time.   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 On a battery of neuropsychological tests, she showed below average to average performance. Performance was generally intact for working memory and information processing speed tasks. Basic auditory attention was relatively weak compared to mental computation and basic arithmetic skills. Verbal and non-verbal abstract problem solving were below expectation compared to  predicted ability. Language abilities were slightly mixed. Specifically, vocabulary knowledge was average while her ability to answer general knowledge type questions was well below average. Confrontation naming and  semantic verbal fluency were below average. Lexical fluency was average. Hearing was impaired bilaterally but more impacted on right side. Auditory comprehension appeared generally intact. The patient showed good performance with regard to her verbal learning and memory. Encoding, organization, and storage of simple visual information was also intact. Her ability to identify, maintain, and shift problem solving strategies in response to examiner feedback was exceptionally low. She showed evidence of significant perseveration and poor distress tolerance. This is consistent with her husband's report of mood instability and worsening cognitive status since 2018.  Results of neuropsychological testing are somewhat inconsistent with estimates of baseline functioning and normal aging. Her profile is mildly concerning for an organic process. The presence of executive dysfunction, weakness in abstract reasoning and higher order problem solving, and notable perseveration, with no reported decline in activities of daily living, warrant a diagnosis of Mild neurocognitive disorder at this time.    Her profile likely reflects several factors working in combination including possible neurodevelopmental (e.g., learning difficulties), mood related factors,  and cerebrovascular involvement. 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 She does not have a history of seizures and there is no evidence of cerebral hemorrhage, infection, infestation, neoplasm or trauma. However, she has briefly "blacked out" and fallen twice in the past year; 6 weeks apart. It is important to continue monitoring patient's cardiovascular health and for patient to engage in healthier lifestyle behaviors.   Positive prognostic factors include preserved ability to follow multi-step instructions, communicate most needs, acknowledge need for help, cooperate with others, enjoy leisure activities, ambulate without assistance,  independently perform all basic ADL's, and supportive family/husband.   Recommendations: Based on the findings of the present evaluation, the following recommendations are offered: 1. Optimal control of vascular risk factors is necessary to reduce the risk of further cognitive decline.  2. Based on her test performances, the patient will likely benefit from repetition of to-be-remembered information.  3. Cognitive behavioral treatment for depression and bipolar disorder can be highly effective and therefore recommended for this patient. If she is amenable to this modality of treatment, I will gladly refer her to [facility].  4. Psychopharmacological treatment is also indicated. She currently takes Lamictal (200mg ). This should be continued for now, but after she starts psychotherapy, she may wish to see a psychiatrist to determine if this is the most beneficial medication for her condition.  5. Neuropsychological re-assessment in one year (or sooner if there is a change in cognition or behavior) is recommended in order to monitor cognitive status, track any progression of symptoms and further assist with treatment planning.  Feedback to Patient: Anne Mcdonald will return for a feedback appointment on 06/27/2020 to review the results of her neuropsychological evaluation with this provider.   Thank you for your referral of Anne Mcdonald. Please feel free to contact me if you have any questions or concerns regarding this report.

## 2020-06-26 ENCOUNTER — Other Ambulatory Visit: Payer: Self-pay | Admitting: Family Medicine

## 2020-06-26 ENCOUNTER — Other Ambulatory Visit: Payer: Self-pay | Admitting: Sports Medicine

## 2020-06-26 DIAGNOSIS — G2581 Restless legs syndrome: Secondary | ICD-10-CM

## 2020-06-27 ENCOUNTER — Encounter: Payer: Managed Care, Other (non HMO) | Attending: Psychology | Admitting: Psychology

## 2020-06-27 ENCOUNTER — Encounter: Payer: Self-pay | Admitting: Psychology

## 2020-06-27 ENCOUNTER — Other Ambulatory Visit: Payer: Self-pay

## 2020-06-27 DIAGNOSIS — F17200 Nicotine dependence, unspecified, uncomplicated: Secondary | ICD-10-CM

## 2020-06-27 DIAGNOSIS — F319 Bipolar disorder, unspecified: Secondary | ICD-10-CM | POA: Diagnosis present

## 2020-06-27 DIAGNOSIS — G3184 Mild cognitive impairment, so stated: Secondary | ICD-10-CM

## 2020-06-27 DIAGNOSIS — H9193 Unspecified hearing loss, bilateral: Secondary | ICD-10-CM | POA: Diagnosis not present

## 2020-06-27 DIAGNOSIS — G43009 Migraine without aura, not intractable, without status migrainosus: Secondary | ICD-10-CM

## 2020-06-27 DIAGNOSIS — F330 Major depressive disorder, recurrent, mild: Secondary | ICD-10-CM | POA: Diagnosis present

## 2020-06-27 NOTE — Progress Notes (Addendum)
Neuropsychology Feedback Appointment  Anne Mcdonald and her husband returned for a feedback appointment today to review the results of her recent neuropsychological evaluation with this provider. 60 minutes face-to-face time was spent reviewing her test results, my impressions and my recommendations as detailed in her report. Education was provided about Mild Neurocognitive disorder. The patient and husband were given the opportunity to ask questions, and I did my best to answer these to their satisfaction.   Below is a copy of my clinical impressions and recommendations from the evaluation dated 06/21/20 in EMR. Initial consult can be found in EMR on 04/13/20  Clinical Impressions: Mild neurocognitive disorder [ICD-10 Code: G31.84]   The patient's performance appeared generally valid. On a battery of neuropsychological tests, she showed below average to average performance. Performance was generally intact for working memory and information processing speed tasks. Basic auditory attention was relatively weak compared to mental computation and basic arithmetic skills. Verbal and non-verbal abstract problem solving were below expectation compared to  predicted ability. Language abilities were slightly mixed. Specifically, vocabulary knowledge was average while her ability to answer general knowledge type questions was well below average. Confrontation naming and semantic verbal fluency were below average. Lexical fluency was average. Hearing was impaired bilaterally but more impacted on right side. Auditory comprehension appeared generally intact. The patient showed good performance with regard to her verbal learning and memory. Encoding, organization, and storage of simple visual information was also intact. Her ability to identify, maintain, and shift problem solving strategies in response to examiner feedback was exceptionally low. She showed evidence of significant perseveration and poor distress  tolerance. This is consistent with her husband's report of mood instability and worsening cognitive status since 2018.  Results of neuropsychological testing are somewhat inconsistent with estimates of baseline functioning and normal aging. Her profile is mildly concerning for an organic process. The presence of executive dysfunction, weakness in abstract reasoning and higher order problem solving, and notable perseveration, with no reported decline in activities of daily living, warrant a diagnosis of Mild neurocognitive disorder at this time.    Her profile likely reflects several factors working in combination including possible neurodevelopmental (e.g., learning difficulties), mood related factors, and cerebrovascular involvement. 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 She does not have a history of seizures and there is no evidence of cerebral hemorrhage, infection, infestation, neoplasm or trauma. However, she has briefly "blacked out" and fallen twice in the past year; 6 weeks apart. It is important to continue monitoring patient's cardiovascular health and for patient to engage in healthier lifestyle behaviors.   Positive prognostic factors include preserved ability to follow multi-step instructions, communicate most needs, acknowledge need for help, cooperate with others, enjoy leisure activities, ambulate without assistance, independently perform all basic ADL's, and supportive family/husband.   Recommendations: Based on the findings of the present evaluation, the following recommendations are offered: 1. Optimal control of vascular risk factors is necessary to reduce the risk of further cognitive decline.  2. Based on her test performances, the patient will likely benefit from repetition of to-be-remembered information.  3. Cognitive behavioral treatment for depression and bipolar disorder can be highly effective and therefore recommended for this patient.  If she is amenable to this modality of treatment, I will gladly refer her to [facility].  4. Psychopharmacological treatment is also indicated. She currently takes Lamictal (200mg ). This should be continued for now, but after she starts psychotherapy, she may wish to see a psychiatrist to  determine if this is the most beneficial medication for her condition.  5. Neuropsychological re-assessment in one year (or sooner if there is a change in cognition or behavior) is recommended in order to monitor cognitive status, track any progression of symptoms and further assist with treatment planning.  Feedback to Patient: Anne Mcdonald will return for a feedback appointment on 06/27/2020 to review the results of her neuropsychological evaluation with this provider.   Thank you for your referral of Anne Mcdonald. Please feel free to contact me if you have any questions or concerns regarding this report.   Billing/Service Summary:   Neurobehavioral Status Exam (DOS: 04/13/20)  Base: 41937 Add-on: 90240  Direct clinical assessment (interview) of the patient and collateral interviews (as appropriate) by the licensed psychologist                                                                                      Total time: 120 minutes       Total units:  1 1  Neuropsychological Testing Evaluation Services (DOS: 06/21/20)  Base: 97353 Add-on: 29924  Records review & clarify referral question; Patient symptom management; clinical decision making/battery modification; Integration/report generation; and, post-service work   Total time:  240 minutes  Total units:  1 3  Designer, fashion/clothing by Psychologist (DOS: 06/19/20)  Base: 26834 Add-on: 19622  Test Administration (face-to-face) Scoring (Non-face-to-face)                                                                                       Total time: 240 minutes      Total units:  1 7

## 2020-07-04 ENCOUNTER — Other Ambulatory Visit: Payer: Self-pay

## 2020-07-04 ENCOUNTER — Ambulatory Visit: Payer: Managed Care, Other (non HMO) | Admitting: Sports Medicine

## 2020-07-04 ENCOUNTER — Ambulatory Visit: Payer: Self-pay

## 2020-07-04 ENCOUNTER — Encounter: Payer: Self-pay | Admitting: Sports Medicine

## 2020-07-04 DIAGNOSIS — S99921D Unspecified injury of right foot, subsequent encounter: Secondary | ICD-10-CM

## 2020-07-04 MED ORDER — CYCLOBENZAPRINE HCL 10 MG PO TABS: 10.0000 mg | ORAL_TABLET | Freq: Three times a day (TID) | ORAL | 0 refills | Status: DC | PRN

## 2020-07-04 NOTE — Progress Notes (Signed)
    Procedures performed today:    Procedure: Real-time Ultrasound Guided injection of the right calcaneocuboid joint Device: Samsung HS60  Verbal informed consent obtained.  Time-out conducted.  Noted no overlying erythema, induration, or other signs of local infection.  Skin prepped in a sterile fashion.  Local anesthesia: Topical Ethyl chloride.  With sterile technique and under real time ultrasound guidance:  Noted arthritic joint with some synovitis, 1 cc kenalog 40, and 1 cc lidocaine injected easily.   Completed without difficulty  Advised to call if fevers/chills, erythema, induration, drainage, or persistent bleeding.  Images permanently stored and available for review in PACS.  Impression: Technically successful ultrasound guided injection.  Independent interpretation of notes and tests performed by another provider:   None.  Brief History, Exam, Impression, and Recommendations:    Right foot injury This is a very pleasant 60 year old female, approximately 4 months ago she blacked out and fell, injured her foot.  She had some bruising and pain at the calcaneocuboid articulation, she got some custom orthotics, a boot for a while and felt a lot better, unfortunately she continues to have pain near the calcaneocuboid joint. 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    ___________________________________________ Gwen Her. Dianah Field, M.D., ABFM., CAQSM. Primary Care and [place] Instructor of [employer] [facility]

## 2020-07-04 NOTE — Assessment & Plan Note (Signed)
This is a very pleasant 60 year old female, approximately 4 months ago she blacked out and fell, injured her foot.  She had some bruising and pain at the calcaneocuboid articulation, she got some custom orthotics, a boot for a while and felt a lot better, unfortunately she continues to have pain near the calcaneocuboid joint. 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

## 2020-07-24 ENCOUNTER — Other Ambulatory Visit: Payer: Self-pay

## 2020-07-24 ENCOUNTER — Encounter: Payer: Self-pay | Admitting: Family Medicine

## 2020-07-24 ENCOUNTER — Ambulatory Visit: Payer: Managed Care, Other (non HMO) | Admitting: Family Medicine

## 2020-07-24 VITALS — BP 131/79 | HR 61 | Ht 64.0 in | Wt 146.0 lb

## 2020-07-24 DIAGNOSIS — I471 Supraventricular tachycardia: Secondary | ICD-10-CM | POA: Diagnosis not present

## 2020-07-24 DIAGNOSIS — R1013 Epigastric pain: Secondary | ICD-10-CM | POA: Diagnosis not present

## 2020-07-24 DIAGNOSIS — K581 Irritable bowel syndrome with constipation: Secondary | ICD-10-CM | POA: Diagnosis not present

## 2020-07-24 DIAGNOSIS — D72829 Elevated white blood cell count, unspecified: Secondary | ICD-10-CM | POA: Diagnosis not present

## 2020-07-24 NOTE — Progress Notes (Signed)
Established Patient Office Visit  Subjective:  Patient ID: Anne Mcdonald, female    DOB: 1960-05-27  Age: 60 y.o. MRN: [id number]  CC:  Chief Complaint  Patient presents with  . Abdominal Pain    HPI Anne Mcdonald presents for abdominal pain -she describes it as mostly epigastric discomfort.  He has a history of abience and follows with GI.  In fact they actually did a recent upper endoscopy and did not find any worrisome findings did mention maybe doing a scope but she said she is already had that done twice over the last several years and it really was unrevealing and she did not think it would be helpful.  She says that the discomfort will come and go most in a flare.  He says she recently had labs and was told that her white blood cell count was elevated.  She is also been more fatigued than usual.  She feels like the generic bystolic makes her heart pound.  She would like to go back to the brand if possible it was switched right around the new start of the new year.  Her insurance wouldn't cover [place].  She had side effects with the alternatives, Trulance and the Amitiza.  Linzess really is what has worked the best for her overall.  Past Medical History:  Diagnosis Date  . Fibroid uterus   . PUD (peptic ulcer disease)     Past Surgical History:  Procedure Laterality Date  . CESAREAN SECTION    . CHOLECYSTECTOMY      Family History  Adopted: Yes  Family history unknown: Yes    Social History   Socioeconomic History  . Marital status: Married    Spouse name: Not on file  . Number of children: Not on file  . Years of education: Not on file  . Highest education level: Not on file  Occupational History  . Not on file  Tobacco Use  . Smoking status: Current Every Day Smoker    Packs/day: 1.00    Types: Cigarettes  . Smokeless tobacco: Never Used  Vaping Use  . Vaping Use: Never used  Substance and Sexual Activity  . Alcohol use: No  . Drug use: No   . Sexual activity: Not Currently    Birth control/protection: None, Surgical  Other Topics Concern  . Not on file  Social History Narrative  . Not on file   Social Determinants of Health   Financial Resource Strain: Not on file  Food Insecurity: Not on file  Transportation Needs: Not on file  Physical Activity: Not on file  Stress: Not on file  Social Connections: Not on file  Intimate Partner Violence: Not on file    Outpatient Medications Prior to Visit  Medication Sig Dispense Refill  . Ascorbic Acid (VITAMIN C) 1000 MG tablet Take by mouth.    Marland Kitchen CALCIUM-MAGNESIUM-ZINC PO Take by mouth.    . Cholecalciferol (VITAMIN D3) 5000 units CAPS Take by mouth.    . clobetasol ointment (TEMOVATE) 0.05 % Apply to palms of hands QD prn. 45 g 0  . cyclobenzaprine (FLEXERIL) 10 MG tablet Take 1 tablet (10 mg total) by mouth 3 (three) times daily as needed for muscle spasms. 30 tablet 0  . dicyclomine (BENTYL) 20 MG tablet Take 20 mg by mouth 3 (three) times daily as needed.  1  . Galcanezumab-gnlm (EMGALITY) 120 MG/ML SOAJ Inject 240 mg into the skin as directed AND 120 mg every 30 (thirty) days. Inj 240mg   once then 120mg  monthly. 1 pen 11  . HYDROcodone-acetaminophen (NORCO) 10-325 MG tablet Take 1 tablet by mouth every 8 (eight) hours as needed. 15 tablet 0  . ketoconazole (NIZORAL) 2 % shampoo Apply 1 application topically 2 (two) times a week. 120 mL 6  . lamoTRIgine (LAMICTAL) 200 MG tablet Take 200 mg by mouth daily.    . Multiple Vitamin (THERA) TABS Take by mouth.    . Omega-3 Fatty Acids (FISH OIL) 1000 MG CAPS Take by mouth.    . rizatriptan (MAXALT) 10 MG tablet TAKE 1 TABLET BY MOUTH AS NEEDED FOR MIGRAINE. MAY REPEAT IN 2 HOURS IF NEEDED 12 tablet 5  . rOPINIRole (REQUIP) 1 MG tablet TAKE 1 TABLET BY MOUTH AT BEDTIME. MAY TAKE 1 EXTRA TAB PER DAY IF NEEDED 180 tablet 2  . traZODone (DESYREL) 150 MG tablet TAKE 1 TABLET BY MOUTH EVERYDAY AT BEDTIME    . dicyclomine (BENTYL) 10 MG  capsule TAKE 1 CAPSULE (10 MG TOTAL) BY MOUTH 4 (FOUR) TIMES DAILY - BEFORE MEALS AND AT BEDTIME. 60 capsule 0  . nebivolol (BYSTOLIC) 2.5 MG tablet TAKE 1 TABLET BY MOUTH EVERY DAY 90 tablet 1  . terbinafine (LAMISIL) 250 MG tablet TAKE 1 TABLET BY MOUTH EVERY DAY 90 tablet 0   No facility-administered medications prior to visit.    Allergies  Allergen Reactions  . Other Rash  . Trulance [Plecanatide] Other (See Comments)    Abdominal pain    ROS Review of Systems    Objective:    Physical Exam  BP 131/79   Pulse 61   Ht 5\' 4"  (1.626 m)   Wt 146 lb (66.2 kg)   LMP 07/17/2016   SpO2 100%   BMI 25.06 kg/m  Wt Readings from Last 3 Encounters:  07/24/20 146 lb (66.2 kg)  04/19/20 145 lb (65.8 kg)  02/14/20 149 lb 3.2 oz (67.7 kg)     There are no preventive care reminders to display for this patient.  There are no preventive care reminders to display for this patient.  Lab Results  Component Value Date   TSH 2.02 02/14/2020   Lab Results  Component Value Date   WBC 8.3 07/24/2020   HGB 13.6 07/24/2020   HCT 40.6 07/24/2020   MCV 95.5 07/24/2020   PLT 269 07/24/2020   Lab Results  Component Value Date   NA 140 02/14/2020   K 4.0 02/14/2020   CO2 26 02/14/2020   GLUCOSE 91 02/14/2020   BUN 14 02/14/2020   CREATININE 0.75 02/14/2020   BILITOT 0.7 02/14/2020   ALKPHOS 86 11/26/2016   AST 14 02/14/2020   ALT 10 02/14/2020   PROT 6.5 02/14/2020   ALBUMIN 4.4 11/26/2016   CALCIUM 9.8 02/14/2020   Lab Results  Component Value Date   CHOL 191 02/14/2020   Lab Results  Component Value Date   HDL 61 02/14/2020   Lab Results  Component Value Date   LDLCALC 109 (H) 02/14/2020   Lab Results  Component Value Date   TRIG 107 02/14/2020   Lab Results  Component Value Date   CHOLHDL 3.1 02/14/2020   Lab Results  Component Value Date   HGBA1C 5.1 12/02/2018      Assessment & Plan:   Problem List Items Addressed This Visit       Cardiovascular and Mediastinum   Paroxysmal SVT (supraventricular tachycardia) ([place])    She has done really well on brand Bystolic but it was recently changed  to generic and feels like it is just not as effective in controlling her symptoms.  We will try to send over new prescription to the pharmacy for brand only but we will have to see whether or not the insurance will cover it and at what cost.      Relevant Medications   BYSTOLIC 2.5 MG tablet     Digestive   Irritable bowel syndrome with constipation    Organ to try to see if we can get the Linzess covered again.  It fell off of her formulary and so we were unable to get it even with the PA.  But it is in a year and we discussed that it is probably worth trying to see if we can get it covered again.      Relevant Medications   dicyclomine (BENTYL) 20 MG tablet   linaclotide (LINZESS) 290 MCG CAPS capsule    Other Visit Diagnoses    Leukocytosis, unspecified type    -  Primary   Relevant Orders   CBC with Differential/Platelet (Completed)   Urinalysis, Routine w reflex microscopic (Completed)   CT Abdomen Pelvis W Contrast   Epigastric pain       Relevant Orders   CT Abdomen Pelvis W Contrast      Epigastric pain-unclear etiology not sure if it is related to her IBS in part.  She just had a normal upper endoscopy which is great.  We discussed moving forward with CT of abdomen for just further comprehensive work-up.  If normal then unfortunately she will probably have to get back in with her GI doctor.  Leukocytosis on recent lab work-we will plan to recheck white blood cell count to see if it is normalized.  Meds ordered this encounter  Medications  . nitrofurantoin, macrocrystal-monohydrate, (MACROBID) 100 MG capsule    Sig: Take 1 capsule (100 mg total) by mouth 2 (two) times daily.    Dispense:  10 capsule    Refill:  0  . linaclotide (LINZESS) 290 MCG CAPS capsule    Sig: Take 1 capsule (290 mcg total) by mouth daily  before breakfast.    Dispense:  90 capsule    Refill:  3    Has tried and failed Amitiza and Trulance.  Marland Kitchen BYSTOLIC 2.5 MG tablet    Sig: Take 1 tablet (2.5 mg total) by mouth daily.    Dispense:  90 tablet    Refill:  3    Follow-up: Return in about 4 weeks (around 08/21/2020), or Epigastric pain.    Beatrice Lecher, MD

## 2020-07-25 ENCOUNTER — Telehealth: Payer: Self-pay | Admitting: Radiology

## 2020-07-25 ENCOUNTER — Encounter: Payer: Self-pay | Admitting: Family Medicine

## 2020-07-25 LAB — URINALYSIS, ROUTINE W REFLEX MICROSCOPIC
Bilirubin Urine: NEGATIVE
Glucose, UA: NEGATIVE
Hgb urine dipstick: NEGATIVE
Hyaline Cast: NONE SEEN /LPF
Ketones, ur: NEGATIVE
Nitrite: NEGATIVE
Protein, ur: NEGATIVE
RBC / HPF: NONE SEEN /HPF (ref 0–2)
Specific Gravity, Urine: 1.01 (ref 1.001–1.035)
WBC, UA: NONE SEEN /HPF (ref 0–5)
pH: 7 (ref 5.0–8.0)

## 2020-07-25 LAB — CBC WITH DIFFERENTIAL/PLATELET
Absolute Monocytes: 498 cells/uL (ref 200–950)
Basophils Absolute: 33 cells/uL (ref 0–200)
Basophils Relative: 0.4 %
Eosinophils Absolute: 141 cells/uL (ref 15–500)
Eosinophils Relative: 1.7 %
HCT: 40.6 % (ref 35.0–45.0)
Hemoglobin: 13.6 g/dL (ref 11.7–15.5)
Lymphs Abs: 2058 cells/uL (ref 850–3900)
MCH: 32 pg (ref 27.0–33.0)
MCHC: 33.5 g/dL (ref 32.0–36.0)
MCV: 95.5 fL (ref 80.0–100.0)
MPV: 8.7 fL (ref 7.5–12.5)
Monocytes Relative: 6 %
Neutro Abs: 5569 cells/uL (ref 1500–7800)
Neutrophils Relative %: 67.1 %
Platelets: 269 10*3/uL (ref 140–400)
RBC: 4.25 10*6/uL (ref 3.80–5.10)
RDW: 13.1 % (ref 11.0–15.0)
Total Lymphocyte: 24.8 %
WBC: 8.3 10*3/uL (ref 3.8–10.8)

## 2020-07-25 LAB — MICROSCOPIC MESSAGE

## 2020-07-25 MED ORDER — LINACLOTIDE 290 MCG PO CAPS: 290.0000 ug | ORAL_CAPSULE | Freq: Every day | ORAL | 3 refills | Status: DC

## 2020-07-25 MED ORDER — NITROFURANTOIN MONOHYD MACRO 100 MG PO CAPS: 100.0000 mg | ORAL_CAPSULE | Freq: Two times a day (BID) | ORAL | 0 refills | Status: DC

## 2020-07-25 MED ORDER — BYSTOLIC 2.5 MG PO TABS: 2.5000 mg | ORAL_TABLET | Freq: Every day | ORAL | 3 refills | Status: DC

## 2020-07-25 NOTE — Assessment & Plan Note (Signed)
She has done really well on brand Bystolic but it was recently changed to generic and feels like it is just not as effective in controlling her symptoms.  We will try to send over new prescription to the pharmacy for brand only but we will have to see whether or not the insurance will cover it and at what cost.

## 2020-07-25 NOTE — Telephone Encounter (Signed)
Left message to call [facility] to schedule for July Headache follow-up

## 2020-07-25 NOTE — Assessment & Plan Note (Signed)
Organ to try to see if we can get the Linzess covered again.  It fell off of her formulary and so we were unable to get it even with the PA.  But it is in a year and we discussed that it is probably worth trying to see if we can get it covered again.

## 2020-08-02 ENCOUNTER — Ambulatory Visit: Payer: Managed Care, Other (non HMO) | Admitting: Sports Medicine

## 2020-08-02 ENCOUNTER — Other Ambulatory Visit: Payer: Self-pay

## 2020-08-02 DIAGNOSIS — S99921D Unspecified injury of right foot, subsequent encounter: Secondary | ICD-10-CM | POA: Diagnosis not present

## 2020-08-02 DIAGNOSIS — M7742 Metatarsalgia, left foot: Secondary | ICD-10-CM | POA: Diagnosis not present

## 2020-08-02 NOTE — Assessment & Plan Note (Signed)
Pad placed in the left orthotic, return as needed for this.

## 2020-08-02 NOTE — Progress Notes (Signed)
    Procedures performed today:    None.  Independent interpretation of notes and tests performed by another provider:   None.  Brief History, Exam, Impression, and Recommendations:    Right foot injury This is a pleasant 60 year old female, I saw her about a month ago, we injected her calcaneocuboid joint. She improved considerably. She has some pain a little bit further down in the foot, but attributes this to standing on a ladder for hours doing some painting. Were to leave this alone for now and I like to see her back maybe 2 weeks after she finishes painting to see if it goes away on its own, if not we will do an MRI.  Metatarsalgia, left foot Pad placed in the left orthotic, return as needed for this.    ___________________________________________ Gwen Her. Dianah Field, M.D., ABFM., CAQSM. Primary Care and [place] Instructor of [place] [facility] Medicine

## 2020-08-02 NOTE — Assessment & Plan Note (Signed)
This is a pleasant 60 year old female, I saw her about a month ago, we injected her calcaneocuboid joint. She improved considerably. She has some pain a little bit further down in the foot, but attributes this to standing on a ladder for hours doing some painting. Were to leave this alone for now and I like to see her back maybe 2 weeks after she finishes painting to see if it goes away on its own, if not we will do an MRI.

## 2020-08-04 ENCOUNTER — Telehealth: Payer: Self-pay | Admitting: Family Medicine

## 2020-08-08 NOTE — Telephone Encounter (Signed)
Error

## 2020-08-23 ENCOUNTER — Other Ambulatory Visit: Payer: Self-pay

## 2020-08-23 ENCOUNTER — Ambulatory Visit (INDEPENDENT_AMBULATORY_CARE_PROVIDER_SITE_OTHER): Payer: Managed Care, Other (non HMO)

## 2020-08-23 ENCOUNTER — Ambulatory Visit: Payer: Managed Care, Other (non HMO) | Admitting: Sports Medicine

## 2020-08-23 DIAGNOSIS — M7742 Metatarsalgia, left foot: Secondary | ICD-10-CM | POA: Diagnosis not present

## 2020-08-23 DIAGNOSIS — S99921D Unspecified injury of right foot, subsequent encounter: Secondary | ICD-10-CM | POA: Diagnosis not present

## 2020-08-23 NOTE — Assessment & Plan Note (Signed)
Calcaneocuboid joint on the right side continues to do well after injection back in April. Return as needed for this.

## 2020-08-23 NOTE — Progress Notes (Signed)
    Procedures performed today:    None.  Independent interpretation of notes and tests performed by another provider:   None.  Brief History, Exam, Impression, and Recommendations:    Metatarsalgia, left foot Watt Climes returns, she is a pleasant 60 year old female with left-sided metatarsalgia, fourth MTP. She had a wedding reception this weekend, and had some increase in pain after wearing some heels. She also has a lot of work to do in the yard and some home improvement, we will get some x-rays today, once she is done with all of her yard work we will consider MRI if still hurting. She can call me for the MRI and I am happy to order it.  Right foot injury Calcaneocuboid joint on the right side continues to do well after injection back in April. Return as needed for this.    ___________________________________________ Gwen Her. Dianah Field, M.D., ABFM., CAQSM. Primary Care and [place] Instructor of [place] [facility] Medicine

## 2020-08-23 NOTE — Assessment & Plan Note (Signed)
Anne Mcdonald returns, she is a pleasant 60 year old female with left-sided metatarsalgia, fourth MTP. She had a wedding reception this weekend, and had some increase in pain after wearing some heels. She also has a lot of work to do in the yard and some home improvement, we will get some x-rays today, once she is done with all of her yard work we will consider MRI if still hurting. She can call me for the MRI and I am happy to order it.

## 2020-08-24 ENCOUNTER — Telehealth: Payer: Self-pay | Admitting: *Deleted

## 2020-08-24 ENCOUNTER — Ambulatory Visit: Payer: Managed Care, Other (non HMO) | Admitting: Family Medicine

## 2020-08-24 ENCOUNTER — Encounter: Payer: Self-pay | Admitting: Family Medicine

## 2020-08-24 ENCOUNTER — Telehealth: Payer: Self-pay | Admitting: Family Medicine

## 2020-08-24 VITALS — BP 113/66 | HR 65 | Ht 64.0 in | Wt 147.0 lb

## 2020-08-24 DIAGNOSIS — K581 Irritable bowel syndrome with constipation: Secondary | ICD-10-CM | POA: Diagnosis not present

## 2020-08-24 DIAGNOSIS — F331 Major depressive disorder, recurrent, moderate: Secondary | ICD-10-CM

## 2020-08-24 DIAGNOSIS — I471 Supraventricular tachycardia: Secondary | ICD-10-CM | POA: Diagnosis not present

## 2020-08-24 NOTE — Telephone Encounter (Signed)
PA submitted on Cover My Meds for Bystolic 2.5mg .  Awaiting response.

## 2020-08-24 NOTE — Telephone Encounter (Signed)
Sent over prescription for branded Bystolic about a month ago patient says she never heard back from anyone including the pharmacy.  Can we call the pharmacy and see if maybe it requires a PA I have not received any paperwork on the medication.  She feels like the brand was better at controlling her palpitations which is why she would like to go back to brand it was changed to generic in January.

## 2020-08-24 NOTE — Telephone Encounter (Signed)
PA submitted.

## 2020-08-24 NOTE — Progress Notes (Signed)
She reports that she is doing much better [place]. Hasn't had anymore of the stomach problems.    She also mentioned that over the weekend she returned home from [place] on Sunday and her daughter brought their dogs home from being boarded she felt her heart begin to pound. She stated that this lasts for a few seconds, she denies any SOB but does get a little lightheaded. She mentioned this to Dr. Dianah Field at her last visit he advised her to eat more salt.  She feels that she has been under more stress lately due to trying to renovate her home before her daughter gets married in August. I asked her what does she do when the episode comes on she states that she just slows down or stops what she is doing for a while until the feeling passes.

## 2020-08-24 NOTE — Progress Notes (Signed)
Established Patient Office Visit  Subjective:  Patient ID: Anne Mcdonald, female    DOB: [birth year]  Age: 60 y.o. MRN: [id number]  CC:  Chief Complaint  Patient presents with  . Follow-up    HPI Anne Mcdonald presents for   She reports that she is doing much better since being on the [place]. The insurance was willing to cover it this time.     Hasn't had anymore of the stomach problems.   She also mentioned that over the weekend she returned home from [place] on Sunday and her daughter brought their dogs home from being boarded she felt her heart begin to pound. She stated that this lasts for a few seconds, she denies any SOB but does get a little lightheaded. She mentioned this to Dr. Dianah Field at her last visit he advised her to eat more salt.  She feels that she has been under more stress lately due to trying to renovate her home before her daughter gets married in August. I asked her what does she do when the episode comes on she states that she just slows down or stops what she is doing for a while until the feeling passes.   She also has 2 high strung dogs and realize that when they picked them up after boarding the to go to the wedding, her heart started pounding and realized how much stress they actually cause her.  She is still working with her therapist regularly.  Unfortunately she feels trapped in a marriage that she does not have the financial means to get out of.  She just feels like her husband never really backs her up or supports her and the trust level was not there.  Palpitations-she was not able to get the branded Bystolic she said they never called her about it.  Past Medical History:  Diagnosis Date  . Fibroid uterus   . PUD (peptic ulcer disease)     Past Surgical History:  Procedure Laterality Date  . CESAREAN SECTION    . CHOLECYSTECTOMY      Family History  Adopted: Yes  Family history unknown: Yes    Social History    Socioeconomic History  . Marital status: Married    Spouse name: Not on file  . Number of children: Not on file  . Years of education: Not on file  . Highest education level: Not on file  Occupational History  . Not on file  Tobacco Use  . Smoking status: Current Every Day Smoker    Packs/day: 1.00    Types: Cigarettes  . Smokeless tobacco: Never Used  Vaping Use  . Vaping Use: Never used  Substance and Sexual Activity  . Alcohol use: No  . Drug use: No  . Sexual activity: Not Currently    Birth control/protection: None, Surgical  Other Topics Concern  . Not on file  Social History Narrative  . Not on file   Social Determinants of Health   Financial Resource Strain: Not on file  Food Insecurity: Not on file  Transportation Needs: Not on file  Physical Activity: Not on file  Stress: Not on file  Social Connections: Not on file  Intimate Partner Violence: Not on file    Outpatient Medications Prior to Visit  Medication Sig Dispense Refill  . hydrOXYzine (VISTARIL) 25 MG capsule Take 25 mg by mouth 2 (two) times daily.  2  . LamoTRIgine 200 MG TB24 24 hour tablet Take 1 tablet by mouth daily.    Marland Kitchen  Ascorbic Acid (VITAMIN C) 1000 MG tablet Take by mouth.    . BYSTOLIC 2.5 MG tablet Take 1 tablet (2.5 mg total) by mouth daily. 90 tablet 3  . CALCIUM-MAGNESIUM-ZINC PO Take by mouth.    . Cholecalciferol (VITAMIN D3) 5000 units CAPS Take by mouth.    . clobetasol ointment (TEMOVATE) 0.05 % Apply to palms of hands QD prn. 45 g 0  . dicyclomine (BENTYL) 20 MG tablet Take 20 mg by mouth 3 (three) times daily as needed.  1  . Galcanezumab-gnlm (EMGALITY) 120 MG/ML SOAJ Inject 240 mg into the skin as directed AND 120 mg every 30 (thirty) days. Inj 240mg  once then 120mg  monthly. 1 pen 11  . ketoconazole (NIZORAL) 2 % shampoo Apply 1 application topically 2 (two) times a week. 120 mL 6  . linaclotide (LINZESS) 290 MCG CAPS capsule Take 1 capsule (290 mcg total) by mouth daily  before breakfast. 90 capsule 3  . Multiple Vitamin (THERA) TABS Take by mouth.    . Omega-3 Fatty Acids (FISH OIL) 1000 MG CAPS Take by mouth.    . rizatriptan (MAXALT) 10 MG tablet TAKE 1 TABLET BY MOUTH AS NEEDED FOR MIGRAINE. MAY REPEAT IN 2 HOURS IF NEEDED 12 tablet 5  . rOPINIRole (REQUIP) 1 MG tablet TAKE 1 TABLET BY MOUTH AT BEDTIME. MAY TAKE 1 EXTRA TAB PER DAY IF NEEDED 180 tablet 2  . traZODone (DESYREL) 150 MG tablet TAKE 1 TABLET BY MOUTH EVERYDAY AT BEDTIME    . cyclobenzaprine (FLEXERIL) 10 MG tablet Take 1 tablet (10 mg total) by mouth 3 (three) times daily as needed for muscle spasms. 30 tablet 0  . HYDROcodone-acetaminophen (NORCO) 10-325 MG tablet Take 1 tablet by mouth every 8 (eight) hours as needed. 15 tablet 0  . lamoTRIgine (LAMICTAL) 200 MG tablet Take 200 mg by mouth daily.    . nitrofurantoin, macrocrystal-monohydrate, (MACROBID) 100 MG capsule Take 1 capsule (100 mg total) by mouth 2 (two) times daily. 10 capsule 0   No facility-administered medications prior to visit.    Allergies  Allergen Reactions  . Other Rash  . Trulance [Plecanatide] Other (See Comments)    Abdominal pain    ROS Review of Systems    Objective:    Physical Exam  BP 113/66   Pulse 65   Ht 5\' 4"  (1.626 m)   Wt 147 lb (66.7 kg)   LMP 07/17/2016   SpO2 98%   BMI 25.23 kg/m  Wt Readings from Last 3 Encounters:  08/24/20 147 lb (66.7 kg)  07/24/20 146 lb (66.2 kg)  04/19/20 145 lb (65.8 kg)     Health Maintenance Due  Topic Date Due  . Pneumococcal Vaccine 63-23 Years old (1 of 2 - PPSV23) Never done  . Zoster Vaccines- Shingrix (1 of 2) Never done    There are no preventive care reminders to display for this patient.  Lab Results  Component Value Date   TSH 2.02 02/14/2020   Lab Results  Component Value Date   WBC 8.3 07/24/2020   HGB 13.6 07/24/2020   HCT 40.6 07/24/2020   MCV 95.5 07/24/2020   PLT 269 07/24/2020   Lab Results  Component Value Date   NA 140  02/14/2020   K 4.0 02/14/2020   CO2 26 02/14/2020   GLUCOSE 91 02/14/2020   BUN 14 02/14/2020   CREATININE 0.75 02/14/2020   BILITOT 0.7 02/14/2020   ALKPHOS 86 11/26/2016   AST 14 02/14/2020   ALT  10 02/14/2020   PROT 6.5 02/14/2020   ALBUMIN 4.4 11/26/2016   CALCIUM 9.8 02/14/2020   Lab Results  Component Value Date   CHOL 191 02/14/2020   Lab Results  Component Value Date   HDL 61 02/14/2020   Lab Results  Component Value Date   LDLCALC 109 (H) 02/14/2020   Lab Results  Component Value Date   TRIG 107 02/14/2020   Lab Results  Component Value Date   CHOLHDL 3.1 02/14/2020   Lab Results  Component Value Date   HGBA1C 5.1 12/02/2018      Assessment & Plan:   Problem List Items Addressed This Visit      Cardiovascular and Mediastinum   Paroxysmal SVT (supraventricular tachycardia) (HCC)    Bystolic has worked best for her but ever since it got switched to generic she has been having more palpitations she would like to go back to brand if at all possible.  We had had sent a prescription when she was here couple months ago but she never received it so we will call the pharmacy to see if it might need to be prior off.        Digestive   Irritable bowel syndrome with constipation - Primary    Back on Linzess and doing really well which is fantastic.        Other   Moderate episode of recurrent major depressive disorder ([place])    Still meeting with her psychiatrist regularly.  They did recommend a trial of hydroxyzine but she has not tried it yet we discussed the medication and I do think it could be helpful for her even if she just takes it as needed for really stressful days.  Continue with therapy and counseling which I think is going to be extremely helpful for her.      Relevant Medications   hydrOXYzine (VISTARIL) 25 MG capsule      No orders of the defined types were placed in this encounter.   Follow-up: Return in about 6 months (around  02/23/2021) for heart rate.   I spent 35 minutes on the day of the encounter to include pre-visit record review, face-to-face time with the patient and post visit ordering of test.   Beatrice Lecher, MD

## 2020-08-25 NOTE — Assessment & Plan Note (Signed)
Still meeting with her psychiatrist regularly.  They did recommend a trial of hydroxyzine but she has not tried it yet we discussed the medication and I do think it could be helpful for her even if she just takes it as needed for really stressful days.  Continue with therapy and counseling which I think is going to be extremely helpful for her.

## 2020-08-25 NOTE — Assessment & Plan Note (Signed)
Back on Linzess and doing really well which is fantastic.

## 2020-08-25 NOTE — Assessment & Plan Note (Signed)
Bystolic has worked best for her but ever since it got switched to generic she has been having more palpitations she would like to go back to brand if at all possible.  We had had sent a prescription when she was here couple months ago but she never received it so we will call the pharmacy to see if it might need to be prior off.

## 2020-08-31 NOTE — Telephone Encounter (Signed)
PA denied.  Placed in provider's basket.

## 2020-09-01 NOTE — Telephone Encounter (Signed)
OK, can you let pt know we did try.

## 2020-09-05 NOTE — Telephone Encounter (Signed)
LMOM notifying pt.

## 2020-09-15 ENCOUNTER — Other Ambulatory Visit: Payer: Self-pay | Admitting: Family Medicine

## 2020-09-15 DIAGNOSIS — Z1231 Encounter for screening mammogram for malignant neoplasm of breast: Secondary | ICD-10-CM

## 2020-09-29 ENCOUNTER — Encounter: Payer: Managed Care, Other (non HMO) | Admitting: Physician Assistant

## 2020-09-29 ENCOUNTER — Other Ambulatory Visit: Payer: Self-pay | Admitting: Physician Assistant

## 2020-10-10 ENCOUNTER — Other Ambulatory Visit: Payer: Self-pay | Admitting: Physician Assistant

## 2020-10-13 ENCOUNTER — Encounter: Payer: Self-pay | Admitting: Physician Assistant

## 2020-10-13 ENCOUNTER — Other Ambulatory Visit: Payer: Self-pay

## 2020-10-13 ENCOUNTER — Ambulatory Visit: Payer: Managed Care, Other (non HMO) | Admitting: Physician Assistant

## 2020-10-13 VITALS — BP 104/68 | HR 59

## 2020-10-13 DIAGNOSIS — G43009 Migraine without aura, not intractable, without status migrainosus: Secondary | ICD-10-CM | POA: Diagnosis not present

## 2020-10-13 MED ORDER — EMGALITY 120 MG/ML ~~LOC~~ SOAJ: SUBCUTANEOUS | 11 refills | Status: DC

## 2020-10-13 NOTE — Progress Notes (Signed)
History:  Anne Mcdonald is a 60 y.o. [id number] who presents to clinic today for yearly HA eval.  She has noted more HA of late but states she is busy/stressed with upcoming wedding for her daughter in 2 weeks and health problems for her son requiring multiple hospital admissions over the previous 6 months.  She continues to feel emgality helps control the frequency of her migraines.  She continues to get relief with Maxalt.      Past Medical History:  Diagnosis Date   Fibroid uterus    PUD (peptic ulcer disease)     Social History   Socioeconomic History   Marital status: Married    Spouse name: Not on file   Number of children: Not on file   Years of education: Not on file   Highest education level: Not on file  Occupational History   Not on file  Tobacco Use   Smoking status: Every Day    Packs/day: 1.00    Types: Cigarettes   Smokeless tobacco: Never  Vaping Use   Vaping Use: Never used  Substance and Sexual Activity   Alcohol use: No   Drug use: No   Sexual activity: Not Currently    Birth control/protection: None, Surgical  Other Topics Concern   Not on file  Social History Narrative   Not on file   Social Determinants of Health   Financial Resource Strain: Not on file  Food Insecurity: Not on file  Transportation Needs: Not on file  Physical Activity: Not on file  Stress: Not on file  Social Connections: Not on file  Intimate Partner Violence: Not on file    Family History  Adopted: Yes  Family history unknown: Yes    Allergies  Allergen Reactions   Other Rash   Trulance [Plecanatide] Other (See Comments)    Abdominal pain    Current Outpatient Medications on File Prior to Visit  Medication Sig Dispense Refill   Ascorbic Acid (VITAMIN C) 1000 MG tablet Take by mouth.     BYSTOLIC 2.5 MG tablet Take 1 tablet (2.5 mg total) by mouth daily. 90 tablet 3   CALCIUM-MAGNESIUM-ZINC PO Take by mouth.     Cholecalciferol (VITAMIN D3) 5000 units CAPS  Take by mouth.     clobetasol ointment (TEMOVATE) 0.05 % Apply to palms of hands QD prn. 45 g 0   dicyclomine (BENTYL) 20 MG tablet Take 20 mg by mouth 3 (three) times daily as needed.  1   EMGALITY 120 MG/ML SOAJ INJECT 240 MG INTO THE SKIN ONCE AS DIRECTED AND 120 MG EVERY 30 (THIRTY) DAYS. 1 mL 0   hydrOXYzine (VISTARIL) 25 MG capsule Take 25 mg by mouth 2 (two) times daily.  2   ketoconazole (NIZORAL) 2 % shampoo Apply 1 application topically 2 (two) times a week. 120 mL 6   LamoTRIgine 200 MG TB24 24 hour tablet Take 1 tablet by mouth daily.     linaclotide (LINZESS) 290 MCG CAPS capsule Take 1 capsule (290 mcg total) by mouth daily before breakfast. 90 capsule 3   Multiple Vitamin (THERA) TABS Take by mouth.     Omega-3 Fatty Acids (FISH OIL) 1000 MG CAPS Take by mouth.     rizatriptan (MAXALT) 10 MG tablet TAKE 1 TABLET BY MOUTH AS NEEDED FOR MIGRAINE. MAY REPEAT IN 2 HOURS IF NEEDED 12 tablet 5   rOPINIRole (REQUIP) 1 MG tablet TAKE 1 TABLET BY MOUTH AT BEDTIME. MAY TAKE 1 EXTRA TAB PER DAY  IF NEEDED 180 tablet 2   traZODone (DESYREL) 150 MG tablet TAKE 1 TABLET BY MOUTH EVERYDAY AT BEDTIME     No current facility-administered medications on file prior to visit.     Review of Systems:  All pertinent positive/negative included in HPI, all other review of systems are negative   Objective:  Physical Exam BP 104/68   Pulse (!) 59   LMP 07/17/2016  CONSTITUTIONAL: Well-developed, well-nourished female in no acute distress.  EYES: EOM intact ENT: Normocephalic, hard of hearing as is her baseline CARDIOVASCULAR: Regular rate  RESPIRATORY: Normal rate.  MUSCULOSKELETAL: Normal ROM, SKIN: Warm, dry without erythema  NEUROLOGICAL: Alert, oriented, CN II-XII grossly intact, Appropriate balance PSYCH: Normal behavior, mood   Assessment & Plan:  Assessment: 1. Migraine without aura and without status migrainosus, not intractable    stable  Plan: Continue Emgality for prevention  and maxalt for acute HA.   Follow-up in 12 months or sooner PRN  Lacie Draft 10/13/2020 9:19 AM

## 2020-10-26 ENCOUNTER — Other Ambulatory Visit: Payer: Self-pay | Admitting: Family Medicine

## 2020-10-26 DIAGNOSIS — N63 Unspecified lump in unspecified breast: Secondary | ICD-10-CM

## 2020-10-31 ENCOUNTER — Other Ambulatory Visit: Payer: Self-pay

## 2020-10-31 ENCOUNTER — Ambulatory Visit
Admission: RE | Admit: 2020-10-31 | Discharge: 2020-10-31 | Disposition: A | Discharge status: Home / Self Care | Payer: Managed Care, Other (non HMO) | Source: Ambulatory Visit | Attending: Family Medicine | Admitting: Family Medicine

## 2020-10-31 DIAGNOSIS — N63 Unspecified lump in unspecified breast: Secondary | ICD-10-CM

## 2020-11-26 ENCOUNTER — Other Ambulatory Visit: Payer: Self-pay | Admitting: Family Medicine

## 2020-11-26 DIAGNOSIS — I471 Supraventricular tachycardia: Secondary | ICD-10-CM

## 2021-01-03 ENCOUNTER — Encounter: Payer: Self-pay | Admitting: Family Medicine

## 2021-02-27 ENCOUNTER — Other Ambulatory Visit: Payer: Self-pay

## 2021-02-27 ENCOUNTER — Ambulatory Visit: Payer: Managed Care, Other (non HMO) | Admitting: Family Medicine

## 2021-02-27 ENCOUNTER — Encounter: Payer: Self-pay | Admitting: Family Medicine

## 2021-02-27 VITALS — BP 108/64 | HR 61 | Ht 64.0 in | Wt 149.0 lb

## 2021-02-27 DIAGNOSIS — E781 Pure hyperglyceridemia: Secondary | ICD-10-CM

## 2021-02-27 DIAGNOSIS — I471 Supraventricular tachycardia: Secondary | ICD-10-CM | POA: Diagnosis not present

## 2021-02-27 DIAGNOSIS — F331 Major depressive disorder, recurrent, moderate: Secondary | ICD-10-CM | POA: Diagnosis not present

## 2021-02-27 DIAGNOSIS — E559 Vitamin D deficiency, unspecified: Secondary | ICD-10-CM | POA: Diagnosis not present

## 2021-02-27 NOTE — Assessment & Plan Note (Signed)
Overall she is doing okay she has quite a few stressors.  Her relationship with her husband is actually been a little bit better lately which is reassuring.  And I think having her daughter who recently got married move out has also reduced some stress for her as well.

## 2021-02-27 NOTE — Assessment & Plan Note (Signed)
Due for updated labs.  We will call with results once available.

## 2021-02-27 NOTE — Assessment & Plan Note (Signed)
Continue with Bystolic for now but we did discuss consultation with cardiology since she is still having some breakthrough symptoms and I am really unable to push her dose because of her blood pressure.  She would really like to have a better explanation for why she still having some breakthroughs it may just be that we need to consider switching to a different medication but she has tolerated this 1 well.  Going to place cardiology referral she will be traveling to [place] in January to see her mom and so would prefer to be seen after she returns.

## 2021-02-27 NOTE — Progress Notes (Signed)
Established Patient Office Visit  Subjective:  Patient ID: Anne Mcdonald, female    DOB: 09-29-1960  Age: 60 y.o. MRN: [id number]  CC:  Chief Complaint  Patient presents with   Follow-up    HPI Anne Mcdonald presents for follow-up paroxysmal SVT.  She is currently on Bystolic.  She says that it is better than it was but she still occasionally gets breakthrough symptoms sometimes with physical activity occasionally it can just be at rest but more often it is with physical activity.  She will still feel it race or pound.  But she does feel like the Bystolic helpful.  We have not increased her dose because her blood pressures tend to run a little bit low.  She follows with neurology for her migraines.  She has been under some stress recently with 2 of her children.  Her daughter actually recently got married.  Her mother also has advancing dementia and they did switch nursing homes back in August and she is actually doing a lot better.  Effectual be going down to [place] to visit her in January.  Past Medical History:  Diagnosis Date   Fibroid uterus    PUD (peptic ulcer disease)     Past Surgical History:  Procedure Laterality Date   CESAREAN SECTION     CHOLECYSTECTOMY     TOTAL ABDOMINAL HYSTERECTOMY W/ BILATERAL SALPINGOOPHORECTOMY  08/2016    Family History  Adopted: Yes  Family history unknown: Yes    Social History   Socioeconomic History   Marital status: Married    Spouse name: Not on file   Number of children: Not on file   Years of education: Not on file   Highest education level: Not on file  Occupational History   Not on file  Tobacco Use   Smoking status: Every Day    Packs/day: 1.00    Types: Cigarettes   Smokeless tobacco: Never  Vaping Use   Vaping Use: Never used  Substance and Sexual Activity   Alcohol use: No   Drug use: No   Sexual activity: Not Currently    Birth control/protection: None, Surgical  Other Topics Concern    Not on file  Social History Narrative   Not on file   Social Determinants of Health   Financial Resource Strain: Not on file  Food Insecurity: Not on file  Transportation Needs: Not on file  Physical Activity: Not on file  Stress: Not on file  Social Connections: Not on file  Intimate Partner Violence: Not on file    Outpatient Medications Prior to Visit  Medication Sig Dispense Refill   Ascorbic Acid (VITAMIN C) 1000 MG tablet Take by mouth.     CALCIUM-MAGNESIUM-ZINC PO Take by mouth.     Cholecalciferol (VITAMIN D3) 5000 units CAPS Take by mouth.     clobetasol ointment (TEMOVATE) 0.05 % Apply to palms of hands QD prn. 45 g 0   dicyclomine (BENTYL) 20 MG tablet Take 20 mg by mouth 3 (three) times daily as needed.  1   Galcanezumab-gnlm (EMGALITY) 120 MG/ML SOAJ INJECT 240 MG INTO THE SKIN ONCE AS DIRECTED AND 120 MG EVERY 30 (THIRTY) DAYS. 1 mL 11   ketoconazole (NIZORAL) 2 % shampoo Apply 1 application topically 2 (two) times a week. 120 mL 6   LamoTRIgine 200 MG TB24 24 hour tablet Take 1 tablet by mouth daily.     linaclotide (LINZESS) 290 MCG CAPS capsule Take 1 capsule (290 mcg total)  by mouth daily before breakfast. 90 capsule 3   Multiple Vitamin (THERA) TABS Take by mouth.     nebivolol (BYSTOLIC) 2.5 MG tablet TAKE 1 TABLET BY MOUTH EVERY DAY 90 tablet 1   Omega-3 Fatty Acids (FISH OIL) 1000 MG CAPS Take by mouth.     rizatriptan (MAXALT) 10 MG tablet TAKE 1 TABLET BY MOUTH AS NEEDED FOR MIGRAINE. MAY REPEAT IN 2 HOURS IF NEEDED 12 tablet 11   rOPINIRole (REQUIP) 1 MG tablet TAKE 1 TABLET BY MOUTH AT BEDTIME. MAY TAKE 1 EXTRA TAB PER DAY IF NEEDED 180 tablet 2   traZODone (DESYREL) 150 MG tablet TAKE 1 TABLET BY MOUTH EVERYDAY AT BEDTIME     hydrOXYzine (VISTARIL) 25 MG capsule Take 25 mg by mouth 2 (two) times daily.  2   No facility-administered medications prior to visit.    Allergies  Allergen Reactions   Other Rash   Trulance [Plecanatide] Other (See Comments)     Abdominal pain    ROS Review of Systems    Objective:    Physical Exam Constitutional:      Appearance: Normal appearance. She is well-developed.  HENT:     Head: Normocephalic and atraumatic.  Cardiovascular:     Rate and Rhythm: Normal rate and regular rhythm.     Heart sounds: Normal heart sounds.  Pulmonary:     Effort: Pulmonary effort is normal.     Breath sounds: Normal breath sounds.  Skin:    General: Skin is warm and dry.  Neurological:     Mental Status: She is alert and oriented to person, place, and time.  Psychiatric:        Behavior: Behavior normal.    BP 108/64   Pulse 61   Ht 5\' 4"  (1.626 m)   Wt 149 lb (67.6 kg)   LMP 07/17/2016   SpO2 100%   BMI 25.58 kg/m  Wt Readings from Last 3 Encounters:  02/27/21 149 lb (67.6 kg)  08/24/20 147 lb (66.7 kg)  07/24/20 146 lb (66.2 kg)     There are no preventive care reminders to display for this patient.   There are no preventive care reminders to display for this patient.  Lab Results  Component Value Date   TSH 2.02 02/14/2020   Lab Results  Component Value Date   WBC 8.3 07/24/2020   HGB 13.6 07/24/2020   HCT 40.6 07/24/2020   MCV 95.5 07/24/2020   PLT 269 07/24/2020   Lab Results  Component Value Date   NA 140 02/14/2020   K 4.0 02/14/2020   CO2 26 02/14/2020   GLUCOSE 91 02/14/2020   BUN 14 02/14/2020   CREATININE 0.75 02/14/2020   BILITOT 0.7 02/14/2020   ALKPHOS 86 11/26/2016   AST 14 02/14/2020   ALT 10 02/14/2020   PROT 6.5 02/14/2020   ALBUMIN 4.4 11/26/2016   CALCIUM 9.8 02/14/2020   Lab Results  Component Value Date   CHOL 191 02/14/2020   Lab Results  Component Value Date   HDL 61 02/14/2020   Lab Results  Component Value Date   LDLCALC 109 (H) 02/14/2020   Lab Results  Component Value Date   TRIG 107 02/14/2020   Lab Results  Component Value Date   CHOLHDL 3.1 02/14/2020   Lab Results  Component Value Date   HGBA1C 5.1 12/02/2018       Assessment & Plan:   Problem List Items Addressed This Visit  Cardiovascular and Mediastinum   Paroxysmal SVT (supraventricular tachycardia) (HCC) - Primary    Continue with Bystolic for now but we did discuss consultation with cardiology since she is still having some breakthrough symptoms and I am really unable to push her dose because of her blood pressure.  She would really like to have a better explanation for why she still having some breakthroughs it may just be that we need to consider switching to a different medication but she has tolerated this 1 well.  Going to place cardiology referral she will be traveling to [place] in January to see her mom and so would prefer to be seen after she returns.      Relevant Orders   COMPLETE METABOLIC PANEL WITH GFR   Lipid panel   CBC   Vitamin D (25 hydroxy)   TSH   Ambulatory referral to Cardiology     Other   Moderate episode of recurrent major depressive disorder ([place])    Overall she is doing okay she has quite a few stressors.  Her relationship with her husband is actually been a little bit better lately which is reassuring.  And I think having her daughter who recently got married move out has also reduced some stress for her as well.      HYPERTRIGLYCERIDEMIA    Due for updated labs.  We will call with results once available.      Relevant Orders   COMPLETE METABOLIC PANEL WITH GFR   Lipid panel   CBC   Vitamin D (25 hydroxy)   TSH   Other Visit Diagnoses     Vitamin D deficiency       Relevant Orders   COMPLETE METABOLIC PANEL WITH GFR   Lipid panel   CBC   Vitamin D (25 hydroxy)   TSH        No orders of the defined types were placed in this encounter.   Follow-up: Return in about 6 months (around 08/28/2021) for bp.    Beatrice Lecher, MD

## 2021-03-01 LAB — CBC
HCT: 41.1 % (ref 35.0–45.0)
Hemoglobin: 13.8 g/dL (ref 11.7–15.5)
MCH: 32.2 pg (ref 27.0–33.0)
MCHC: 33.6 g/dL (ref 32.0–36.0)
MCV: 96 fL (ref 80.0–100.0)
MPV: 8.7 fL (ref 7.5–12.5)
Platelets: 311 10*3/uL (ref 140–400)
RBC: 4.28 10*6/uL (ref 3.80–5.10)
RDW: 12.6 % (ref 11.0–15.0)
WBC: 8.6 10*3/uL (ref 3.8–10.8)

## 2021-03-01 LAB — COMPLETE METABOLIC PANEL WITH GFR
AG Ratio: 2.1 (calc) (ref 1.0–2.5)
ALT: 11 U/L (ref 6–29)
AST: 12 U/L (ref 10–35)
Albumin: 4.5 g/dL (ref 3.6–5.1)
Alkaline phosphatase (APISO): 71 U/L (ref 37–153)
BUN: 15 mg/dL (ref 7–25)
CO2: 26 mmol/L (ref 20–32)
Calcium: 9.6 mg/dL (ref 8.6–10.4)
Chloride: 105 mmol/L (ref 98–110)
Creat: 0.78 mg/dL (ref 0.50–1.05)
Globulin: 2.1 g/dL (calc) (ref 1.9–3.7)
Glucose, Bld: 95 mg/dL (ref 65–99)
Potassium: 4 mmol/L (ref 3.5–5.3)
Sodium: 140 mmol/L (ref 135–146)
Total Bilirubin: 0.6 mg/dL (ref 0.2–1.2)
Total Protein: 6.6 g/dL (ref 6.1–8.1)
eGFR: 86 mL/min/{1.73_m2} (ref >=60)

## 2021-03-01 LAB — LIPID PANEL
Cholesterol: 173 mg/dL (ref <200)
HDL: 58 mg/dL (ref >=50)
LDL Cholesterol (Calc): 96 mg/dL (calc)
Non-HDL Cholesterol (Calc): 115 mg/dL (calc) (ref <130)
Total CHOL/HDL Ratio: 3 (calc) (ref <5.0)
Triglycerides: 97 mg/dL (ref <150)

## 2021-03-01 LAB — VITAMIN D 25 HYDROXY (VIT D DEFICIENCY, FRACTURES): Vit D, 25-Hydroxy: 60 ng/mL (ref 30–100)

## 2021-03-01 LAB — TSH: TSH: 2.16 mIU/L (ref 0.40–4.50)

## 2021-03-01 NOTE — Progress Notes (Signed)
Hi Anne Mcdonald, your metabolic panel is normal.  Your cholesterol looks great compared to previous years.  Great work!  Blood count is normal.  Your vitamin D looks good.  Your thyroid looks good.

## 2021-05-08 ENCOUNTER — Ambulatory Visit: Payer: Managed Care, Other (non HMO) | Admitting: Family Medicine

## 2021-05-08 ENCOUNTER — Other Ambulatory Visit: Payer: Self-pay

## 2021-05-08 ENCOUNTER — Encounter: Payer: Self-pay | Admitting: Family Medicine

## 2021-05-08 VITALS — BP 118/73 | HR 64 | Ht 64.0 in | Wt 152.0 lb

## 2021-05-08 DIAGNOSIS — M542 Cervicalgia: Secondary | ICD-10-CM | POA: Diagnosis not present

## 2021-05-08 DIAGNOSIS — M79662 Pain in left lower leg: Secondary | ICD-10-CM | POA: Diagnosis not present

## 2021-05-08 DIAGNOSIS — R1013 Epigastric pain: Secondary | ICD-10-CM | POA: Diagnosis not present

## 2021-05-08 LAB — COMPLETE METABOLIC PANEL WITH GFR
AG Ratio: 2.2 (calc) (ref 1.0–2.5)
ALT: 19 U/L (ref 6–29)
AST: 16 U/L (ref 10–35)
Albumin: 4.7 g/dL (ref 3.6–5.1)
Alkaline phosphatase (APISO): 80 U/L (ref 37–153)
BUN: 11 mg/dL (ref 7–25)
CO2: 28 mmol/L (ref 20–32)
Calcium: 9.9 mg/dL (ref 8.6–10.4)
Chloride: 103 mmol/L (ref 98–110)
Creat: 0.76 mg/dL (ref 0.50–1.05)
Globulin: 2.1 g/dL (calc) (ref 1.9–3.7)
Glucose, Bld: 85 mg/dL (ref 65–99)
Potassium: 4.5 mmol/L (ref 3.5–5.3)
Sodium: 139 mmol/L (ref 135–146)
Total Bilirubin: 0.8 mg/dL (ref 0.2–1.2)
Total Protein: 6.8 g/dL (ref 6.1–8.1)
eGFR: 89 mL/min/{1.73_m2} (ref >=60)

## 2021-05-08 LAB — CBC
HCT: 42.2 % (ref 35.0–45.0)
Hemoglobin: 14.5 g/dL (ref 11.7–15.5)
MCH: 32.3 pg (ref 27.0–33.0)
MCHC: 34.4 g/dL (ref 32.0–36.0)
MCV: 94 fL (ref 80.0–100.0)
MPV: 8.8 fL (ref 7.5–12.5)
Platelets: 311 10*3/uL (ref 140–400)
RBC: 4.49 10*6/uL (ref 3.80–5.10)
RDW: 12.7 % (ref 11.0–15.0)
WBC: 12.1 10*3/uL — ABNORMAL HIGH (ref 3.8–10.8)

## 2021-05-08 LAB — D-DIMER, QUANTITATIVE: D-Dimer, Quant: 0.26 mcg/mL FEU (ref <0.50)

## 2021-05-08 LAB — LIPASE: Lipase: 28 U/L (ref 7–60)

## 2021-05-08 MED ORDER — PANTOPRAZOLE SODIUM 40 MG PO TBEC: 40.0000 mg | DELAYED_RELEASE_TABLET | Freq: Every day | ORAL | 3 refills | Status: DC

## 2021-05-08 NOTE — Progress Notes (Signed)
Established Patient Office Visit  Subjective:  Patient ID: Anne Mcdonald, female    DOB: [birth year]  Age: 61 y.o. MRN: [id number]  CC:  Chief Complaint  Patient presents with   Abdominal Pain    Nausea, constipation/gas, 1 month   Leg Pain    Left lower leg pain, 1 year. No known injury.    Neck Pain    Left side of neck, 2 months. Painful when turning neck. No known injury     HPI [name] presents for   Nausea, constipation/gas, 1 month. Has tried pepto bismol, not on a PPI.  No actual vomiting.  She says her stomach just aches.  Similar to when she had an ulcer years ago but does not feel like she has another ulcer at this point.  She has been taking her Linzess regularly.  Her mother recently passed away about 3-1/2 weeks ago and she was traveling and eating out almost every day.  Since being back home she started to get more on a regular routine and it got a little better but then she had to travel back again and it started again.  She has not had any blood in her stools.  Left lower leg pain off and on since the fall.  No known injury.  She says it is over that left outer leg and into the calf muscle.  He has not noticed any redness rash.  She did have some swelling recently after travel but that has not been an ongoing issue and did resolve itself.  Only the pain will radiate down towards her foot.  She does have some chronic low back issues but says that her back actually has not been bothering her at all lately.  He says sometimes the pain is severe enough that it makes it hard for her to walk  Left side of neck, 2 months.  Things to be on and off.  No specific trigger or injury.  She has had chronic right-sided neck pain for years.  Painful when turning neck. No known injury .   Past Medical History:  Diagnosis Date   Fibroid uterus    PUD (peptic ulcer disease)     Past Surgical History:  Procedure Laterality Date   CESAREAN SECTION      CHOLECYSTECTOMY     TOTAL ABDOMINAL HYSTERECTOMY W/ BILATERAL SALPINGOOPHORECTOMY  08/2016    Family History  Adopted: Yes  Family history unknown: Yes    Social History   Socioeconomic History   Marital status: Married    Spouse name: Not on file   Number of children: Not on file   Years of education: Not on file   Highest education level: Not on file  Occupational History   Not on file  Tobacco Use   Smoking status: Every Day    Packs/day: 1.00    Types: Cigarettes   Smokeless tobacco: Never   Tobacco comments:    1 pack a day   Vaping Use   Vaping Use: Never used  Substance and Sexual Activity   Alcohol use: No   Drug use: No   Sexual activity: Not Currently    Birth control/protection: None, Surgical  Other Topics Concern   Not on file  Social History Narrative   Not on file   Social Determinants of Health   Financial Resource Strain: Not on file  Food Insecurity: Not on file  Transportation Needs: Not on file  Physical Activity: Not on file  Stress: Not on file  Social Connections: Not on file  Intimate Partner Violence: Not on file    Outpatient Medications Prior to Visit  Medication Sig Dispense Refill   Ascorbic Acid (VITAMIN C) 1000 MG tablet Take by mouth.     CALCIUM-MAGNESIUM-ZINC PO Take by mouth.     Cholecalciferol (VITAMIN D3) 5000 units CAPS Take by mouth.     clobetasol ointment (TEMOVATE) 0.05 % Apply to palms of hands QD prn. 45 g 0   dicyclomine (BENTYL) 20 MG tablet Take 20 mg by mouth 3 (three) times daily as needed.  1   Galcanezumab-gnlm (EMGALITY) 120 MG/ML SOAJ INJECT 240 MG INTO THE SKIN ONCE AS DIRECTED AND 120 MG EVERY 30 (THIRTY) DAYS. 1 mL 11   ketoconazole (NIZORAL) 2 % shampoo Apply 1 application topically 2 (two) times a week. 120 mL 6   LamoTRIgine 200 MG TB24 24 hour tablet Take 1 tablet by mouth daily.     linaclotide (LINZESS) 290 MCG CAPS capsule Take 1 capsule (290 mcg total) by mouth daily before breakfast. 90  capsule 3   Multiple Vitamin (THERA) TABS Take by mouth.     nebivolol (BYSTOLIC) 2.5 MG tablet TAKE 1 TABLET BY MOUTH EVERY DAY 90 tablet 1   Omega-3 Fatty Acids (FISH OIL) 1000 MG CAPS Take by mouth.     rizatriptan (MAXALT) 10 MG tablet TAKE 1 TABLET BY MOUTH AS NEEDED FOR MIGRAINE. MAY REPEAT IN 2 HOURS IF NEEDED 12 tablet 11   rOPINIRole (REQUIP) 1 MG tablet TAKE 1 TABLET BY MOUTH AT BEDTIME. MAY TAKE 1 EXTRA TAB PER DAY IF NEEDED 180 tablet 2   traZODone (DESYREL) 150 MG tablet TAKE 1 TABLET BY MOUTH EVERYDAY AT BEDTIME     No facility-administered medications prior to visit.    Allergies  Allergen Reactions   Other Rash   Trulance [Plecanatide] Other (See Comments)    Abdominal pain    ROS Review of Systems    Objective:    Physical Exam Constitutional:      Appearance: Normal appearance. She is well-developed.  HENT:     Head: Normocephalic and atraumatic.  Cardiovascular:     Rate and Rhythm: Normal rate and regular rhythm.     Heart sounds: Normal heart sounds.  Pulmonary:     Effort: Pulmonary effort is normal.     Breath sounds: Normal breath sounds.  Abdominal:     General: Bowel sounds are normal.     Tenderness: There is no abdominal tenderness. There is no guarding or rebound.  Musculoskeletal:     Comments: Lower leg appears to be normal no excess swelling bruising varicose veins etc.  No rash.  Nontender on exam.  Skin:    General: Skin is warm and dry.  Neurological:     Mental Status: She is alert and oriented to person, place, and time.  Psychiatric:        Behavior: Behavior normal.    BP 118/73    Pulse 64    Ht 5' 4"  (1.626 m)    Wt 152 lb (68.9 kg)    LMP 07/17/2016    SpO2 99%    BMI 26.09 kg/m  Wt Readings from Last 3 Encounters:  05/08/21 152 lb (68.9 kg)  02/27/21 149 lb (67.6 kg)  08/24/20 147 lb (66.7 kg)     There are no preventive care reminders to display for this patient.  There are no preventive care reminders to display  for  this patient.  Lab Results  Component Value Date   TSH 2.16 02/28/2021   Lab Results  Component Value Date   WBC 8.6 02/28/2021   HGB 13.8 02/28/2021   HCT 41.1 02/28/2021   MCV 96.0 02/28/2021   PLT 311 02/28/2021   Lab Results  Component Value Date   NA 140 02/28/2021   K 4.0 02/28/2021   CO2 26 02/28/2021   GLUCOSE 95 02/28/2021   BUN 15 02/28/2021   CREATININE 0.78 02/28/2021   BILITOT 0.6 02/28/2021   ALKPHOS 86 11/26/2016   AST 12 02/28/2021   ALT 11 02/28/2021   PROT 6.6 02/28/2021   ALBUMIN 4.4 11/26/2016   CALCIUM 9.6 02/28/2021   EGFR 86 02/28/2021   Lab Results  Component Value Date   CHOL 173 02/28/2021   Lab Results  Component Value Date   HDL 58 02/28/2021   Lab Results  Component Value Date   LDLCALC 96 02/28/2021   Lab Results  Component Value Date   TRIG 97 02/28/2021   Lab Results  Component Value Date   CHOLHDL 3.0 02/28/2021   Lab Results  Component Value Date   HGBA1C 5.1 12/02/2018      Assessment & Plan:   Problem List Items Addressed This Visit       Other   Cervical pain   Relevant Orders   COMPLETE METABOLIC PANEL WITH GFR   Lipase   CBC   D-Dimer, Quantitative   Other Visit Diagnoses     Pain in left lower leg    -  Primary   Relevant Orders   COMPLETE METABOLIC PANEL WITH GFR   Lipase   CBC   D-Dimer, Quantitative   Epigastric pain       Relevant Orders   COMPLETE METABOLIC PANEL WITH GFR   Lipase   CBC   D-Dimer, Quantitative   H. pylori breath test      Abdominal pain and nausea-we will check for H. pylori since she has not been on a PPI.  We will also do some labs just to rule out hepatitis and pancreatitis and check a CBC and rule out anemia.  If everything is normal then recommend a trial of a PPI for 2 weeks.  Continue with daily Linzess since this seems to help with her bowel movements.  Left-sided neck pain-discussed that we could consider referring her to formal physical therapy I do  think it could be helpful.  She is not having a lot of discomfort or pain today.  Left lower leg pain-we will check a D-dimer just to rule the possibility of a blood clot she feels like its been going on really since the fall but its been more bothersome lately after recent travel.  I did not appreciate any rash swelling redness etc. on exam.  Also consider could be neuropathy such as the sciatic nerve it is really coming from her back even though it is just affecting her left lower leg.  If not improving then we will plan to get her in with Ortho or sports med  No orders of the defined types were placed in this encounter.   Follow-up: Return if symptoms worsen or fail to improve.    Beatrice Lecher, MD

## 2021-05-08 NOTE — Progress Notes (Signed)
No sign of blood clot!  Liver and kidney function is normal.  No sign of pancreatitis.  The H. pylori test will usually take a few days to come back so in the short-term like to send over medication for your stomach for you to try for 2 weeks and see if you feel like it is helping if it is then continue for an extra 2 weeks.  If its not helping then please let me know.

## 2021-05-08 NOTE — Addendum Note (Signed)
Addended by: Beatrice Lecher D on: 05/08/2021 05:46 PM   Modules accepted: Orders

## 2021-05-09 LAB — H. PYLORI BREATH TEST: H. pylori Breath Test: NOT DETECTED

## 2021-05-09 NOTE — Progress Notes (Signed)
Negative for bacteria called H. pylori that can cause stomach irritation and upset.

## 2021-06-05 ENCOUNTER — Other Ambulatory Visit: Payer: Self-pay | Admitting: Family Medicine

## 2021-06-05 DIAGNOSIS — I471 Supraventricular tachycardia: Secondary | ICD-10-CM

## 2021-06-11 ENCOUNTER — Encounter: Payer: Self-pay | Admitting: Family Medicine

## 2021-06-11 ENCOUNTER — Other Ambulatory Visit: Payer: Self-pay

## 2021-06-11 ENCOUNTER — Ambulatory Visit: Payer: Managed Care, Other (non HMO) | Admitting: Family Medicine

## 2021-06-11 VITALS — BP 107/62 | HR 72 | Resp 16 | Ht 64.0 in | Wt 153.0 lb

## 2021-06-11 DIAGNOSIS — R1013 Epigastric pain: Secondary | ICD-10-CM | POA: Diagnosis not present

## 2021-06-11 DIAGNOSIS — R319 Hematuria, unspecified: Secondary | ICD-10-CM

## 2021-06-11 DIAGNOSIS — R102 Pelvic and perineal pain: Secondary | ICD-10-CM | POA: Diagnosis not present

## 2021-06-11 MED ORDER — NITROFURANTOIN MONOHYD MACRO 100 MG PO CAPS: 100.0000 mg | ORAL_CAPSULE | Freq: Two times a day (BID) | ORAL | 0 refills | Status: DC

## 2021-06-11 NOTE — Progress Notes (Signed)
? ?Acute Office Visit ? ?Subjective:  ? ? Patient ID: Anne Mcdonald, female    DOB: April 09, 1960, 61 y.o.   MRN: [id number] ? ?Chief Complaint  ?Patient presents with  ? Urinary Tract Infection  ?  Blood in urine, lower abdominal pain, 2 days   ? ? ?HPI ?Patient is in today for hematuria, lower abd pain x 2 days.  She says she is having some suprapubic cramping and MS feels like menstrual cramps.  She has had some issues with constipation she said she had not had a bowel movement from Thursday to Sunday but then yesterday she finally went several times and feels like she got cleaned out pretty well.  But first thing yesterday morning she noticed a little pink tinge in her urine later in the day yesterday she noticed a little blood in her urine though she has not noticed any blood today.  Versus her chills. ? ?Still having persistent epigastric pain that started back in January please see prior notes.  She says putting her on the PPI she says did help for about a week but now feels like she is having pain again she tested negative for H. pylori. ? ?Still having persistent pain in her left lower leg from behind the knee down to the calf and then around to the shin.  She says it just seems to be chronic and persistent her D-dimer test was -4 weeks ago. ? ?Past Medical History:  ?Diagnosis Date  ? Fibroid uterus   ? PUD (peptic ulcer disease)   ? ? ?Past Surgical History:  ?Procedure Laterality Date  ? CESAREAN SECTION    ? CHOLECYSTECTOMY    ? TOTAL ABDOMINAL HYSTERECTOMY W/ BILATERAL SALPINGOOPHORECTOMY  08/2016  ? ? ?Family History  ?Adopted: Yes  ?Family history unknown: Yes  ? ? ?Social History  ? ?Socioeconomic History  ? Marital status: Married  ?  Spouse name: Not on file  ? Number of children: Not on file  ? Years of education: Not on file  ? Highest education level: Not on file  ?Occupational History  ? Not on file  ?Tobacco Use  ? Smoking status: Every Day  ?  Packs/day: 1.00  ?  Types: Cigarettes  ?  Smokeless tobacco: Never  ? Tobacco comments:  ?  1 pack a day   ?Vaping Use  ? Vaping Use: Never used  ?Substance and Sexual Activity  ? Alcohol use: No  ? Drug use: No  ? Sexual activity: Not Currently  ?  Birth control/protection: None, Surgical  ?Other Topics Concern  ? Not on file  ?Social History Narrative  ? Not on file  ? ?Social Determinants of Health  ? ?Financial Resource Strain: Not on file  ?Food Insecurity: Not on file  ?Transportation Needs: Not on file  ?Physical Activity: Not on file  ?Stress: Not on file  ?Social Connections: Not on file  ?Intimate Partner Violence: Not on file  ? ? ?Outpatient Medications Prior to Visit  ?Medication Sig Dispense Refill  ? Ascorbic Acid (VITAMIN C) 1000 MG tablet Take by mouth.    ? CALCIUM-MAGNESIUM-ZINC PO Take by mouth.    ? Cholecalciferol (VITAMIN D3) 5000 units CAPS Take by mouth.    ? clobetasol ointment (TEMOVATE) 0.05 % Apply to palms of hands QD prn. 45 g 0  ? dicyclomine (BENTYL) 20 MG tablet Take 20 mg by mouth 3 (three) times daily as needed.  1  ? Galcanezumab-gnlm (EMGALITY) 120 MG/ML SOAJ INJECT  240 MG INTO THE SKIN ONCE AS DIRECTED AND 120 MG EVERY 30 (THIRTY) DAYS. 1 mL 11  ? ketoconazole (NIZORAL) 2 % shampoo Apply 1 application topically 2 (two) times a week. 120 mL 6  ? LamoTRIgine 200 MG TB24 24 hour tablet Take 1 tablet by mouth daily.    ? linaclotide (LINZESS) 290 MCG CAPS capsule Take 1 capsule (290 mcg total) by mouth daily before breakfast. 90 capsule 3  ? Multiple Vitamin (THERA) TABS Take by mouth.    ? nebivolol (BYSTOLIC) 2.5 MG tablet TAKE 1 TABLET BY MOUTH EVERY DAY 90 tablet 0  ? Omega-3 Fatty Acids (FISH OIL) 1000 MG CAPS Take by mouth.    ? pantoprazole (PROTONIX) 40 MG tablet Take 1 tablet (40 mg total) by mouth daily. 30 tablet 3  ? rizatriptan (MAXALT) 10 MG tablet TAKE 1 TABLET BY MOUTH AS NEEDED FOR MIGRAINE. MAY REPEAT IN 2 HOURS IF NEEDED 12 tablet 11  ? rOPINIRole (REQUIP) 1 MG tablet TAKE 1 TABLET BY MOUTH AT BEDTIME.  MAY TAKE 1 EXTRA TAB PER DAY IF NEEDED 180 tablet 2  ? traZODone (DESYREL) 150 MG tablet TAKE 1 TABLET BY MOUTH EVERYDAY AT BEDTIME    ? ?No facility-administered medications prior to visit.  ? ? ?Allergies  ?Allergen Reactions  ? Other Rash  ? Trulance [Plecanatide] Other (See Comments)  ?  Abdominal pain  ? ? ?Review of Systems ? ?   ?Objective:  ?  ?Physical Exam ?Vitals reviewed.  ?Constitutional:   ?   Appearance: She is well-developed.  ?HENT:  ?   Head: Normocephalic and atraumatic.  ?Eyes:  ?   Conjunctiva/sclera: Conjunctivae normal.  ?Cardiovascular:  ?   Rate and Rhythm: Normal rate.  ?Pulmonary:  ?   Effort: Pulmonary effort is normal.  ?Skin: ?   General: Skin is dry.  ?   Coloration: Skin is not pale.  ?Neurological:  ?   Mental Status: She is alert and oriented to person, place, and time.  ?Psychiatric:     ?   Behavior: Behavior normal.  ? ? ?BP 107/62   Pulse 72   Resp 16   Ht $R'5\' 4"'KD$  (1.626 m)   Wt 153 lb (69.4 kg)   LMP 07/17/2016   SpO2 98%   BMI 26.26 kg/m?  ?Wt Readings from Last 3 Encounters:  ?06/11/21 153 lb (69.4 kg)  ?05/08/21 152 lb (68.9 kg)  ?02/27/21 149 lb (67.6 kg)  ? ? ?Health Maintenance Due  ?Topic Date Due  ? Zoster Vaccines- Shingrix (1 of 2) Never done  ? ? ?There are no preventive care reminders to display for this patient. ? ? ?Lab Results  ?Component Value Date  ? TSH 2.16 02/28/2021  ? ?Lab Results  ?Component Value Date  ? WBC 12.1 (H) 05/08/2021  ? HGB 14.5 05/08/2021  ? HCT 42.2 05/08/2021  ? MCV 94.0 05/08/2021  ? PLT 311 05/08/2021  ? ?Lab Results  ?Component Value Date  ? NA 139 05/08/2021  ? K 4.5 05/08/2021  ? CO2 28 05/08/2021  ? GLUCOSE 85 05/08/2021  ? BUN 11 05/08/2021  ? CREATININE 0.76 05/08/2021  ? BILITOT 0.8 05/08/2021  ? ALKPHOS 86 11/26/2016  ? AST 16 05/08/2021  ? ALT 19 05/08/2021  ? PROT 6.8 05/08/2021  ? ALBUMIN 4.4 11/26/2016  ? CALCIUM 9.9 05/08/2021  ? EGFR 89 05/08/2021  ? ?Lab Results  ?Component Value Date  ? CHOL 173 02/28/2021  ? ?Lab  Results  ?  Component Value Date  ? HDL 58 02/28/2021  ? ?Lab Results  ?Component Value Date  ? [place] 96 02/28/2021  ? ?Lab Results  ?Component Value Date  ? TRIG 97 02/28/2021  ? ?Lab Results  ?Component Value Date  ? CHOLHDL 3.0 02/28/2021  ? ?Lab Results  ?Component Value Date  ? HGBA1C 5.1 12/02/2018  ? ? ?   ?Assessment & Plan:  ? ?Problem List Items Addressed This Visit   ?None ?Visit Diagnoses   ? ? Hematuria, unspecified type    -  Primary  ? Relevant Orders  ? POCT URINALYSIS DIP (CLINITEK)  ? Suprapubic pressure      ? Epigastric pain      ? Relevant Orders  ? Ambulatory referral to Gastroenterology  ? ?  ? ?Hematuria with suprapubic pressure-most consistent with UTI-unfortunately she just urinated before getting here so was unable to give [place] a sample to help support the diagnosis no prior history of kidney stones we will treat with Macrobid if not improving in the next couple of days then please let us know as we will need to get a urine specimen at that point in time. ? ?Gastric pain-referred to GI she would like to see Dr. Lelon Huh who saw her husband.  PPI only provided some temporary relief for maybe a little over a week. ? ?Left lower leg pain-refer to Dr. Dianah Field or sports med doc.  She does not have any swelling etc. on that leg.  No sign of a blood clot.  But pain continues to persist could be radiating from her left knee she has known OA in that knee but I would like for her to have more formal evaluation. ? ?Meds ordered this encounter  ?Medications  ? nitrofurantoin, macrocrystal-monohydrate, (MACROBID) 100 MG capsule  ?  Sig: Take 1 capsule (100 mg total) by mouth 2 (two) times daily.  ?  Dispense:  10 capsule  ?  Refill:  0  ? ? ? ?Beatrice Lecher, MD ? ?

## 2021-06-12 LAB — POCT URINALYSIS DIP (CLINITEK)
Bilirubin, UA: NEGATIVE
Glucose, UA: NEGATIVE mg/dL
Ketones, POC UA: NEGATIVE mg/dL
Nitrite, UA: NEGATIVE
POC PROTEIN,UA: NEGATIVE
Spec Grav, UA: 1.01 (ref 1.010–1.025)
Urobilinogen, UA: 0.2 E.U./dL
pH, UA: 6.5 (ref 5.0–8.0)

## 2021-06-12 NOTE — Addendum Note (Signed)
Addended by: Izora Gala on: 06/12/2021 10:23 AM ? ? Modules accepted: Orders ? ?

## 2021-06-13 ENCOUNTER — Ambulatory Visit: Payer: Managed Care, Other (non HMO) | Admitting: Sports Medicine

## 2021-06-14 LAB — URINE CULTURE
MICRO NUMBER:: 13162362
SPECIMEN QUALITY:: ADEQUATE

## 2021-06-14 NOTE — Progress Notes (Signed)
We will need to do a urine microscopic review so we can count the whole red blood cells and better quantify if it is enough blood that it needs additional work-up or if it is typical of what could just be seen with normal passing of urine.  I would encourage her to go ahead and finish out her antibiotics since she started it and then we can always recheck her urine when she has been off the antibiotic for for 5 days

## 2021-06-14 NOTE — Progress Notes (Signed)
Hi Madeline, urine culture is negative.

## 2021-06-15 ENCOUNTER — Other Ambulatory Visit: Payer: Self-pay

## 2021-06-15 ENCOUNTER — Ambulatory Visit (INDEPENDENT_AMBULATORY_CARE_PROVIDER_SITE_OTHER): Payer: Managed Care, Other (non HMO)

## 2021-06-15 ENCOUNTER — Ambulatory Visit: Payer: Managed Care, Other (non HMO) | Admitting: Sports Medicine

## 2021-06-15 DIAGNOSIS — M1712 Unilateral primary osteoarthritis, left knee: Secondary | ICD-10-CM

## 2021-06-15 DIAGNOSIS — Z09 Encounter for follow-up examination after completed treatment for conditions other than malignant neoplasm: Secondary | ICD-10-CM | POA: Diagnosis not present

## 2021-06-15 DIAGNOSIS — S8992XA Unspecified injury of left lower leg, initial encounter: Secondary | ICD-10-CM | POA: Insufficient documentation

## 2021-06-15 MED ORDER — CELECOXIB 100 MG PO CAPS: ORAL_CAPSULE | ORAL | 3 refills | Status: DC

## 2021-06-15 NOTE — Progress Notes (Signed)
? ? ?  Procedures performed today:   ? ?None. ? ?Independent interpretation of notes and tests performed by another provider:  ? ?None. ? ?Brief History, Exam, Impression, and Recommendations:   ? ?Primary osteoarthritis of left knee ?This is a very pleasant 61 year old female, she for the past several weeks has had increasing pain left knee medial joint line, worse with prolonged flexion, moderate gelling. ?On exam she has tenderness at the joint line, pain with terminal flexion, x-rays from 2021 did show some osteoarthritis. ?We agreed to start conservatively, low-dose Celebrex as she does have some dyspepsia treated by her PCP, x-rays, home conditioning, return to see me in 6 weeks, injection if not better. ? ?Chronic process with exacerbation and pharmacologic intervention ? ?___________________________________________ ?Gwen Her. Dianah Field, M.D., ABFM., CAQSM. ?Primary Care and Sports Medicine ?[place] ? ?Adjunct Instructor of Family Medicine  ?[employer] ?

## 2021-06-15 NOTE — Assessment & Plan Note (Signed)
This is a very pleasant 61 year old female, she for the past several weeks has had increasing pain left knee medial joint line, worse with prolonged flexion, moderate gelling. ?On exam she has tenderness at the joint line, pain with terminal flexion, x-rays from 2021 did show some osteoarthritis. ?We agreed to start conservatively, low-dose Celebrex as she does have some dyspepsia treated by her PCP, x-rays, home conditioning, return to see me in 6 weeks, injection if not better. ?

## 2021-06-22 ENCOUNTER — Ambulatory Visit (INDEPENDENT_AMBULATORY_CARE_PROVIDER_SITE_OTHER): Payer: Managed Care, Other (non HMO) | Admitting: Family Medicine

## 2021-06-22 VITALS — Resp 16 | Ht 64.0 in | Wt 155.0 lb

## 2021-06-22 DIAGNOSIS — R319 Hematuria, unspecified: Secondary | ICD-10-CM

## 2021-06-22 LAB — POCT URINALYSIS DIP (CLINITEK)
Bilirubin, UA: NEGATIVE
Blood, UA: NEGATIVE
Glucose, UA: NEGATIVE mg/dL
Ketones, POC UA: NEGATIVE mg/dL
Leukocytes, UA: NEGATIVE
Nitrite, UA: NEGATIVE
POC PROTEIN,UA: NEGATIVE
Spec Grav, UA: 1.015 (ref 1.010–1.025)
Urobilinogen, UA: 0.2 E.U./dL
pH, UA: 6.5 (ref 5.0–8.0)

## 2021-06-22 NOTE — Progress Notes (Signed)
Patient in office for recheck of urine. Patient stated she had blood in her urine on 05/2021 and was prescribed Nitrofurantonin. Patient stated she has completed the medication and has not noticed anymore blood in her urine. UA normal today. Will send out for culture.  ?

## 2021-06-23 NOTE — Progress Notes (Signed)
Medical screening examination/treatment was performed by qualified clinical staff member and as supervising physician I was immediately available for consultation/collaboration. I have reviewed documentation and agree with assessment and plan.  Torrie Lafavor, DO  

## 2021-06-24 LAB — URINE CULTURE
MICRO NUMBER:: 13210644
SPECIMEN QUALITY:: ADEQUATE

## 2021-06-26 ENCOUNTER — Ambulatory Visit: Payer: Managed Care, Other (non HMO) | Admitting: Psychology

## 2021-07-27 ENCOUNTER — Ambulatory Visit: Payer: Managed Care, Other (non HMO) | Admitting: Sports Medicine

## 2021-07-30 ENCOUNTER — Other Ambulatory Visit: Payer: Self-pay | Admitting: Family Medicine

## 2021-07-30 DIAGNOSIS — K581 Irritable bowel syndrome with constipation: Secondary | ICD-10-CM

## 2021-07-30 DIAGNOSIS — I471 Supraventricular tachycardia: Secondary | ICD-10-CM

## 2021-08-03 ENCOUNTER — Ambulatory Visit: Payer: Managed Care, Other (non HMO) | Admitting: Sports Medicine

## 2021-08-03 DIAGNOSIS — M1712 Unilateral primary osteoarthritis, left knee: Secondary | ICD-10-CM | POA: Diagnosis not present

## 2021-08-03 NOTE — Progress Notes (Signed)
? ? ?  Procedures performed today:   ? ?None. ? ?Independent interpretation of notes and tests performed by another provider:  ? ?None. ? ?Brief History, Exam, Impression, and Recommendations:   ? ?Primary osteoarthritis of left knee ?Anne Mcdonald returns, she is a very pleasant 61 year old female, x-ray confirmed knee osteoarthritis. ?She does have some knee pain occasionally, medial joint line as well as pain in the shin and calf that I think is either referred from the knee or from Mcdonald lumbar spine. ?She does well when she remembers to use Mcdonald Celebrex. ?We will make sure she is doing Mcdonald conditioning for Mcdonald lumbar spine and knee and she can return to see me as needed. ?Injection if Celebrex becomes ineffective. ? ? ? ?___________________________________________ ?Anne Mcdonald. Dianah Field, M.D., ABFM., CAQSM. ?Primary Care and Sports Medicine ?[place] ? ?Adjunct Instructor of Family Medicine  ?[employer] ?

## 2021-08-03 NOTE — Assessment & Plan Note (Signed)
Anne Mcdonald returns, she is a very pleasant 61 year old female, x-ray confirmed knee osteoarthritis. ?She does have some knee pain occasionally, medial joint line as well as pain in the shin and calf that I think is either referred from the knee or from her lumbar spine. ?She does well when she remembers to use her Celebrex. ?We will make sure she is doing her conditioning for her lumbar spine and knee and she can return to see me as needed. ?Injection if Celebrex becomes ineffective. ?

## 2021-08-04 ENCOUNTER — Other Ambulatory Visit: Payer: Self-pay | Admitting: Family Medicine

## 2021-08-15 ENCOUNTER — Encounter: Payer: Self-pay | Admitting: *Deleted

## 2021-08-28 ENCOUNTER — Ambulatory Visit: Payer: Managed Care, Other (non HMO) | Admitting: Family Medicine

## 2021-09-17 ENCOUNTER — Other Ambulatory Visit: Payer: Self-pay | Admitting: Family Medicine

## 2021-09-17 DIAGNOSIS — Z1231 Encounter for screening mammogram for malignant neoplasm of breast: Secondary | ICD-10-CM

## 2021-09-21 ENCOUNTER — Ambulatory Visit: Payer: Managed Care, Other (non HMO) | Admitting: Family Medicine

## 2021-09-26 ENCOUNTER — Ambulatory Visit (INDEPENDENT_AMBULATORY_CARE_PROVIDER_SITE_OTHER): Payer: Managed Care, Other (non HMO)

## 2021-09-26 ENCOUNTER — Ambulatory Visit: Payer: Managed Care, Other (non HMO) | Admitting: Sports Medicine

## 2021-09-26 DIAGNOSIS — M1712 Unilateral primary osteoarthritis, left knee: Secondary | ICD-10-CM | POA: Diagnosis not present

## 2021-09-26 NOTE — Progress Notes (Signed)
    Procedures performed today:    Procedure: Real-time Ultrasound Guided aspiration/injection of left knee Device: Samsung [id number]  Verbal informed consent obtained.  Time-out conducted.  Noted no overlying erythema, induration, or other signs of local infection.  Skin prepped in a sterile fashion.  Local anesthesia: Topical Ethyl chloride.  With sterile technique and under real time ultrasound guidance: Noted effusion, aspirated 18 mL of clear, straw-colored fluid, syringe switched and 1 cc Kenalog 40, 2 cc lidocaine, 2 cc bupivacaine injected easily Completed without difficulty  Advised to call if fevers/chills, erythema, induration, drainage, or persistent bleeding.  Images permanently stored and available for review in PACS.  Impression: Technically successful ultrasound guided aspiration/injection.  Independent interpretation of notes and tests performed by another provider:   None.  Brief History, Exam, Impression, and Recommendations:    Primary osteoarthritis of left knee Pleasant 61 year old female, known left knee osteoarthritis, increasing pain, she is moderately swollen today. Celebrex ineffective. Home conditioning ineffective. Today we did an aspiration and injection, return in 6 weeks.    ____________________________________________ Gwen Her. Dianah Field, M.D., ABFM., CAQSM., AME. Primary Care and Sports Medicine [facility]  Adjunct Professor of [place] [employer]  Risk manager

## 2021-09-26 NOTE — Assessment & Plan Note (Signed)
Pleasant 61 year old female, known left knee osteoarthritis, increasing pain, she is moderately swollen today. Celebrex ineffective. Home conditioning ineffective. Today we did an aspiration and injection, return in 6 weeks.

## 2021-10-05 ENCOUNTER — Other Ambulatory Visit: Payer: Self-pay | Admitting: Sports Medicine

## 2021-10-05 DIAGNOSIS — M1712 Unilateral primary osteoarthritis, left knee: Secondary | ICD-10-CM

## 2021-10-08 ENCOUNTER — Other Ambulatory Visit: Payer: Self-pay | Admitting: Family Medicine

## 2021-10-08 DIAGNOSIS — G2581 Restless legs syndrome: Secondary | ICD-10-CM

## 2021-10-12 ENCOUNTER — Ambulatory Visit: Payer: Managed Care, Other (non HMO) | Admitting: Sports Medicine

## 2021-10-12 ENCOUNTER — Ambulatory Visit: Payer: Managed Care, Other (non HMO)

## 2021-10-12 DIAGNOSIS — M1712 Unilateral primary osteoarthritis, left knee: Secondary | ICD-10-CM

## 2021-10-12 MED ORDER — MELOXICAM 15 MG PO TABS: ORAL_TABLET | ORAL | 3 refills | Status: DC

## 2021-10-12 MED ORDER — TRAMADOL HCL 50 MG PO TABS: 50.0000 mg | ORAL_TABLET | Freq: Three times a day (TID) | ORAL | 0 refills | Status: DC | PRN

## 2021-10-12 NOTE — Assessment & Plan Note (Signed)
This is a very pleasant 61 year old female, known left knee osteoarthritis, we injected her knee back on the fifth of this month and she did extremely well, was completely pain-free until a week ago when she turned and slipped in her garden. She now has pain medial joint line. There is reproduction of pain with application of valgus stress consistent with an MCL sprain, likely grade 1 as there is no opening of the joint line, ACL is intact, no effusion. Getting x-rays, she has a brace at home, she will get crutches from her pharmacy, we will do meloxicam and tramadol, return to see me in 3 weeks.

## 2021-10-12 NOTE — Progress Notes (Signed)
    Procedures performed today:    None.  Independent interpretation of notes and tests performed by another provider:   None.  Brief History, Exam, Impression, and Recommendations:    Primary osteoarthritis of left knee This is a very pleasant 61 year old female, known left knee osteoarthritis, we injected her knee back on the fifth of this month and she did extremely well, was completely pain-free until a week ago when she turned and slipped in her garden. She now has pain medial joint line. There is reproduction of pain with application of valgus stress consistent with an MCL sprain, likely grade 1 as there is no opening of the joint line, ACL is intact, no effusion. Getting x-rays, she has a brace at home, she will get crutches from her pharmacy, we will do meloxicam and tramadol, return to see me in 3 weeks.  Chronic process with exacerbation and pharmacologic intervention  ____________________________________________ Gwen Her. Dianah Field, M.D., ABFM., CAQSM., AME. Primary Care and Sports Medicine [place] [facility]  Adjunct Professor of [place] [facility] [employer]  Risk manager

## 2021-10-12 NOTE — Patient Instructions (Signed)
STOP celebrex for now.

## 2021-10-16 ENCOUNTER — Other Ambulatory Visit: Payer: Self-pay | Admitting: Physician Assistant

## 2021-10-18 ENCOUNTER — Ambulatory Visit: Payer: Managed Care, Other (non HMO) | Admitting: Sports Medicine

## 2021-10-23 ENCOUNTER — Other Ambulatory Visit: Payer: Self-pay | Admitting: Family Medicine

## 2021-10-23 DIAGNOSIS — K581 Irritable bowel syndrome with constipation: Secondary | ICD-10-CM

## 2021-10-26 ENCOUNTER — Encounter: Payer: Self-pay | Admitting: Physician Assistant

## 2021-10-26 ENCOUNTER — Ambulatory Visit: Payer: Managed Care, Other (non HMO) | Admitting: Physician Assistant

## 2021-10-26 VITALS — BP 109/72 | HR 54 | Wt 149.0 lb

## 2021-10-26 DIAGNOSIS — G43009 Migraine without aura, not intractable, without status migrainosus: Secondary | ICD-10-CM

## 2021-10-26 DIAGNOSIS — G43831 Menstrual migraine, intractable, with status migrainosus: Secondary | ICD-10-CM

## 2021-10-26 MED ORDER — RIZATRIPTAN BENZOATE 10 MG PO TABS: ORAL_TABLET | ORAL | 11 refills | Status: DC

## 2021-10-26 MED ORDER — EMGALITY 120 MG/ML ~~LOC~~ SOAJ: SUBCUTANEOUS | 11 refills | Status: DC

## 2021-10-26 NOTE — Patient Instructions (Signed)
Migraine Headache ?A migraine headache is a very strong throbbing pain on one side or both sides of your head. This type of headache can also cause other symptoms. It can last from 4 hours to 3 days. Talk with your doctor about what things may bring on (trigger) this condition. ?What are the causes? ?The exact cause of this condition is not known. This condition may be triggered or caused by: ?Drinking alcohol. ?Smoking. ?Taking medicines, such as: ?Medicine used to treat chest pain (nitroglycerin). ?Birth control pills. ?Estrogen. ?Some blood pressure medicines. ?Eating or drinking certain products. ?Doing physical activity. ?Other things that may trigger a migraine headache include: ?Having a menstrual period. ?Pregnancy. ?Hunger. ?Stress. ?Not getting enough sleep or getting too much sleep. ?Weather changes. ?Tiredness (fatigue). ?What increases the risk? ?Being 25-55 years old. ?Being female. ?Having a family history of migraine headaches. ?Being Caucasian. ?Having depression or anxiety. ?Being very overweight. ?What are the signs or symptoms? ?A throbbing pain. This pain may: ?Happen in any area of the head, such as on one side or both sides. ?Make it hard to do daily activities. ?Get worse with physical activity. ?Get worse around bright lights or loud noises. ?Other symptoms may include: ?Feeling sick to your stomach (nauseous). ?Vomiting. ?Dizziness. ?Being sensitive to bright lights, loud noises, or smells. ?Before you get a migraine headache, you may get warning signs (an aura). An aura may include: ?Seeing flashing lights or having blind spots. ?Seeing bright spots, halos, or zigzag lines. ?Having tunnel vision or blurred vision. ?Having numbness or a tingling feeling. ?Having trouble talking. ?Having weak muscles. ?Some people have symptoms after a migraine headache (postdromal phase), such as: ?Tiredness. ?Trouble thinking (concentrating). ?How is this treated? ?Taking medicines that: ?Relieve  pain. ?Relieve the feeling of being sick to your stomach. ?Prevent migraine headaches. ?Treatment may also include: ?Having acupuncture. ?Avoiding foods that bring on migraine headaches. ?Learning ways to control your body functions (biofeedback). ?Therapy to help you know and deal with negative thoughts (cognitive behavioral therapy). ?Follow these instructions at home: ?Medicines ?Take over-the-counter and prescription medicines only as told by your doctor. ?Ask your doctor if the medicine prescribed to you: ?Requires you to avoid driving or using heavy machinery. ?Can cause trouble pooping (constipation). You may need to take these steps to prevent or treat trouble pooping: ?Drink enough fluid to keep your pee (urine) pale yellow. ?Take over-the-counter or prescription medicines. ?Eat foods that are high in fiber. These include beans, whole grains, and fresh fruits and vegetables. ?Limit foods that are high in fat and sugar. These include fried or sweet foods. ?Lifestyle ?Do not drink alcohol. ?Do not use any products that contain nicotine or tobacco, such as cigarettes, e-cigarettes, and chewing tobacco. If you need help quitting, ask your doctor. ?Get at least 8 hours of sleep every night. ?Limit and deal with stress. ?General instructions ? ?  ? ?Keep a journal to find out what may bring on your migraine headaches. For example, write down: ?What you eat and drink. ?How much sleep you get. ?Any change in what you eat or drink. ?Any change in your medicines. ?If you have a migraine headache: ?Avoid things that make your symptoms worse, such as bright lights. ?It may help to lie down in a dark, quiet room. ?Do not drive or use heavy machinery. ?Ask your doctor what activities are safe for you. ?Keep all follow-up visits as told by your doctor. This is important. ?Contact a doctor if: ?You get a migraine   headache that is different or worse than others you have had. ?You have more than 15 headache days in one  month. ?Get help right away if: ?Your migraine headache gets very bad. ?Your migraine headache lasts longer than 72 hours. ?You have a fever. ?You have a stiff neck. ?You have trouble seeing. ?Your muscles feel weak or like you cannot control them. ?You start to lose your balance a lot. ?You start to have trouble walking. ?You pass out (faint). ?You have a seizure. ?Summary ?A migraine headache is a very strong throbbing pain on one side or both sides of your head. These headaches can also cause other symptoms. ?This condition may be treated with medicines and changes to your lifestyle. ?Keep a journal to find out what may bring on your migraine headaches. ?Contact a doctor if you get a migraine headache that is different or worse than others you have had. ?Contact your doctor if you have more than 15 headache days in a month. ?This information is not intended to replace advice given to you by your health care provider. Make sure you discuss any questions you have with your health care provider. ?Document Revised: 07/03/2018 Document Reviewed: 04/23/2018 ?Elsevier Patient Education ? 2023 Elsevier Inc. ? ?

## 2021-10-26 NOTE — Progress Notes (Signed)
Patient here for management of HA's.  Last seen 10/13/20 was prescribed Anne Mcdonald and Anne Mcdonald.    Pt states she has been doing ok yet at times she will go a few weeks with a Headache last episode was last night.  Rx's does help.   History:  Anne Mcdonald is a 61 y.o. Anne Mcdonald who presents to clinic today for annual evaluation of migraine.  Overall quality of headaches is unchanged.  She is not interested in making any changes to her migraine medication regimen.   She is currently dealing with an injury of her leg.  It is keeping her from gardening as she would like.   No cardiovascular concerns.  Her mom passed away in 04-19-2022 and pt is tearful discussing this.  She was able to be with her mom up [place] when she passed.   No prospects for grandchildren any time soon.   Past Medical History:  Diagnosis Date   Fibroid uterus    PUD (peptic ulcer disease)     Social History   Socioeconomic History   Marital status: Married    Spouse name: Not on file   Number of children: Not on file   Years of education: Not on file   Highest education level: Not on file  Occupational History   Not on file  Tobacco Use   Smoking status: Every Day    Packs/day: 1.00    Types: Cigarettes   Smokeless tobacco: Never   Tobacco comments:    1 pack a day   Vaping Use   Vaping Use: Never used  Substance and Sexual Activity   Alcohol use: No   Drug use: No   Sexual activity: Not Currently    Birth control/protection: None, Surgical  Other Topics Concern   Not on file  Social History Narrative   Not on file   Social Determinants of Health   Financial Resource Strain: Not on file  Food Insecurity: Not on file  Transportation Needs: Not on file  Physical Activity: Not on file  Stress: Not on file  Social Connections: Not on file  Intimate Partner Violence: Not on file    Family History  Adopted: Yes  Family history unknown: Yes    Allergies  Allergen Reactions   Other Rash    Anne Mcdonald [Plecanatide] Other (See Comments)    Abdominal pain    Current Outpatient Medications on File Prior to Visit  Medication Sig Dispense Refill   Ascorbic Acid (VITAMIN C) 1000 MG tablet Take by mouth.     CALCIUM-MAGNESIUM-ZINC PO Take by mouth.     Cholecalciferol (VITAMIN D3) 5000 units CAPS Take by mouth.     clobetasol ointment (TEMOVATE) 0.05 % Apply to palms of hands QD prn. 45 g 0   dicyclomine (BENTYL) 20 MG tablet Take 20 mg by mouth 3 (three) times daily as needed.  1   Galcanezumab-gnlm (Anne Mcdonald) 120 MG/ML SOAJ INJECT 240 MG INTO THE SKIN ONCE AS DIRECTED AND 120 MG EVERY 30 (THIRTY) DAYS. 1 mL 11   ketoconazole (NIZORAL) 2 % shampoo Apply 1 application topically 2 (two) times a week. 120 mL 6   LamoTRIgine 200 MG TB24 24 hour tablet Take 1 tablet by mouth daily.     LINZESS 290 MCG CAPS capsule TAKE 1 CAPSULE BY MOUTH EVERY DAY BEFORE BREAKFAST 90 capsule 0   meloxicam (MOBIC) 15 MG tablet One tab PO every 24 hours with a meal for 2 weeks, then once every 24 hours prn  pain. 30 tablet 3   Multiple Vitamin (THERA) TABS Take by mouth.     nebivolol (BYSTOLIC) 2.5 MG tablet TAKE 1 TABLET BY MOUTH EVERY DAY 90 tablet 0   Omega-3 Fatty Acids (FISH OIL) 1000 MG CAPS Take by mouth.     pantoprazole (PROTONIX) 40 MG tablet TAKE 1 TABLET BY MOUTH EVERY DAY 90 tablet 1   rizatriptan (Anne Mcdonald) 10 MG tablet TAKE 1 TABLET BY MOUTH AS NEEDED FOR MIGRAINE. MAY REPEAT IN 2 HOURS IF NEEDED 12 tablet 11   rOPINIRole (REQUIP) 1 MG tablet TAKE 1 TABLET BY MOUTH AT BEDTIME. MAY TAKE 1 EXTRA TAB PER DAY IF NEEDED 180 tablet 2   traMADol (ULTRAM) 50 MG tablet Take 1 tablet (50 mg total) by mouth every 8 (eight) hours as needed for moderate pain. 21 tablet 0   Anne Mcdonald (DESYREL) 150 MG tablet TAKE 1 TABLET BY MOUTH EVERYDAY AT BEDTIME     No current facility-administered medications on file prior to visit.     Review of Systems:  All pertinent positive/negative included in HPI, all other  review of systems are negative   Objective:  Physical Exam BP 109/72   Pulse (!) 54   Wt 149 lb (67.6 kg)   LMP 07/17/2016   BMI 25.58 kg/m  CONSTITUTIONAL: Well-developed, well-nourished female in no acute distress.  EYES: EOM intact ENT: Normocephalic CARDIOVASCULAR: Regular rate RESPIRATORY: Normal rate.  MUSCULOSKELETAL: Normal ROM SKIN: Warm, dry without erythema  NEUROLOGICAL: Alert, oriented, CN II-XII grossly intact, Appropriate balance PSYCH: Normal behavior, mood   Assessment & Plan:  Assessment: 1. Migraine without aura and without status migrainosus, not intractable   2. Headache, menstrual migraine, intractable, with status migrainosus      Plan: Continue monthly Anne Mcdonald for prevention and Anne Mcdonald for acute migraine.   She is on several meds from other providers that are likely giving benefit of migraine prevention as well.   If there are CV concerns, pt will need to d/c Anne Mcdonald/rizatriptan.  Follow-up in 12 months or sooner PRN  Anne Stack, PA-C 10/26/2021 11:49 AM

## 2021-11-01 ENCOUNTER — Ambulatory Visit
Admission: RE | Admit: 2021-11-01 | Discharge: 2021-11-01 | Disposition: A | Discharge status: Home / Self Care | Payer: Managed Care, Other (non HMO) | Source: Ambulatory Visit | Attending: Family Medicine | Admitting: Family Medicine

## 2021-11-01 DIAGNOSIS — Z1231 Encounter for screening mammogram for malignant neoplasm of breast: Secondary | ICD-10-CM

## 2021-11-02 NOTE — Progress Notes (Signed)
Please call patient. Normal mammogram.  Repeat in 1 year.  

## 2021-11-06 ENCOUNTER — Encounter: Payer: Self-pay | Admitting: Sports Medicine

## 2021-11-06 ENCOUNTER — Ambulatory Visit: Payer: Managed Care, Other (non HMO)

## 2021-11-06 ENCOUNTER — Ambulatory Visit: Payer: Managed Care, Other (non HMO) | Admitting: Sports Medicine

## 2021-11-06 DIAGNOSIS — S8992XD Unspecified injury of left lower leg, subsequent encounter: Secondary | ICD-10-CM | POA: Diagnosis not present

## 2021-11-06 MED ORDER — HYDROCODONE-ACETAMINOPHEN 5-325 MG PO TABS: 1.0000 | ORAL_TABLET | Freq: Three times a day (TID) | ORAL | 0 refills | Status: DC | PRN

## 2021-11-06 NOTE — Progress Notes (Signed)
    Procedures performed today:    None.  Independent interpretation of notes and tests performed by another provider:   None.  Brief History, Exam, Impression, and Recommendations:    Left knee injury Pleasant 61 year old female, she has underlying knee osteoarthritis, we did an injection earlier in July, she did well until a slip and twist, at the time of the initial visit for that she had pain with valgus stress concerning for MCL injury, she has since developed some catching and locking concerning for meniscal injury, she also has pain medial femoral condyle and tibial plateau medially concerning for subchondral insufficiency injury and/or bone bruising. Due to persistent symptoms and lack of success of conservative treatment we will proceed with an MRI. Adding hydrocodone for pain, continue bracing. She was too unsteady to use a crutch.    ____________________________________________ Gwen Her. Dianah Field, M.D., ABFM., CAQSM., AME. Primary Care and Sports Medicine [place] [facility]  Adjunct Professor of [place] [employer] [facility] [employer] Medicine  Risk manager

## 2021-11-06 NOTE — Assessment & Plan Note (Signed)
Pleasant 61 year old female, she has underlying knee osteoarthritis, we did an injection earlier in July, she did well until a slip and twist, at the time of the initial visit for that she had pain with valgus stress concerning for MCL injury, she has since developed some catching and locking concerning for meniscal injury, she also has pain medial femoral condyle and tibial plateau medially concerning for subchondral insufficiency injury and/or bone bruising. Due to persistent symptoms and lack of success of conservative treatment we will proceed with an MRI. Adding hydrocodone for pain, continue bracing. She was too unsteady to use a crutch.

## 2021-11-07 ENCOUNTER — Ambulatory Visit: Payer: Managed Care, Other (non HMO) | Admitting: Sports Medicine

## 2021-11-28 ENCOUNTER — Other Ambulatory Visit: Payer: Self-pay | Admitting: Family Medicine

## 2021-11-28 DIAGNOSIS — I471 Supraventricular tachycardia: Secondary | ICD-10-CM

## 2021-11-28 IMAGING — DX DG CHEST 2V
2 series · 2 of 2 positions shown · non-contrast
Comparison: 09/18/2018

CLINICAL DATA: Chest pain, shortness of breath

EXAM:
CHEST - 2 VIEW

[chest pa]
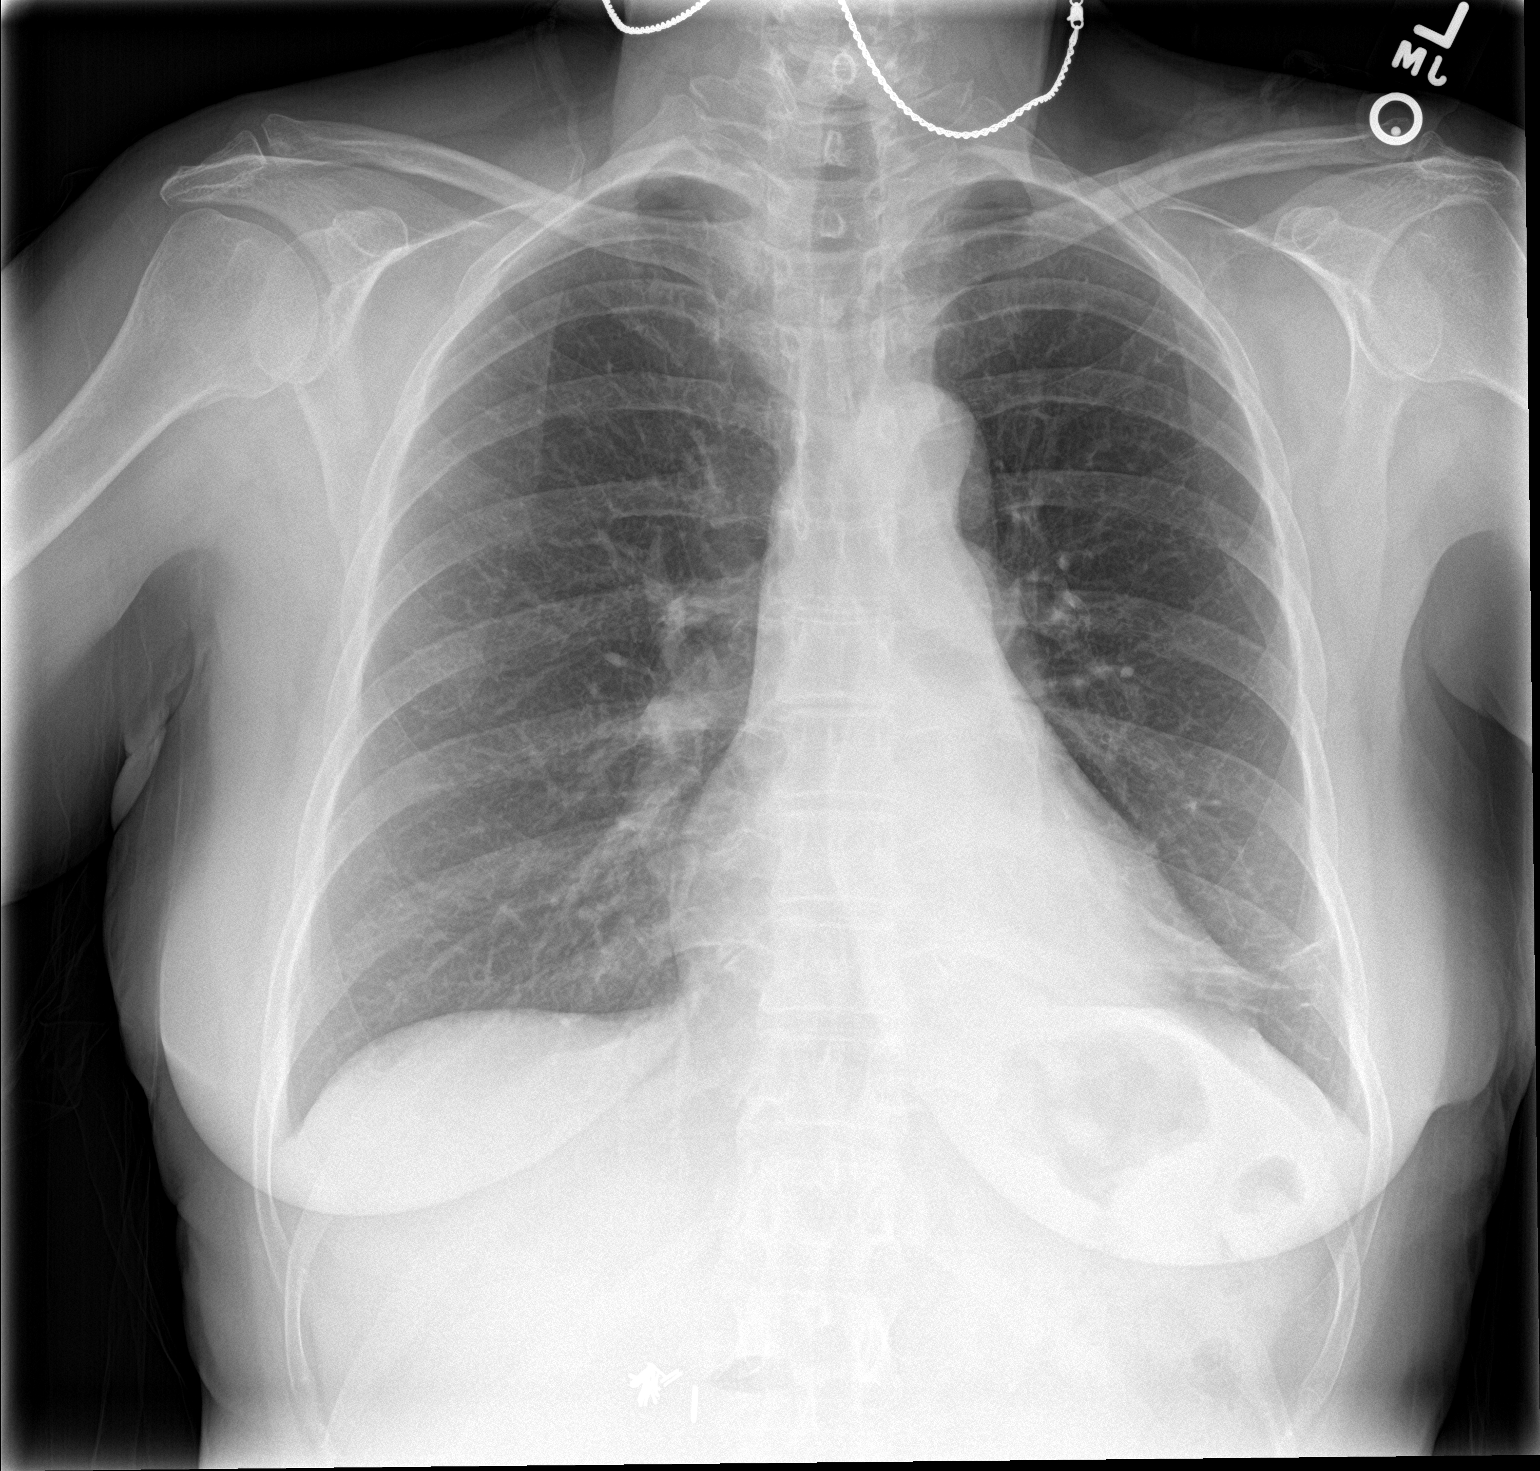

[chest lat]
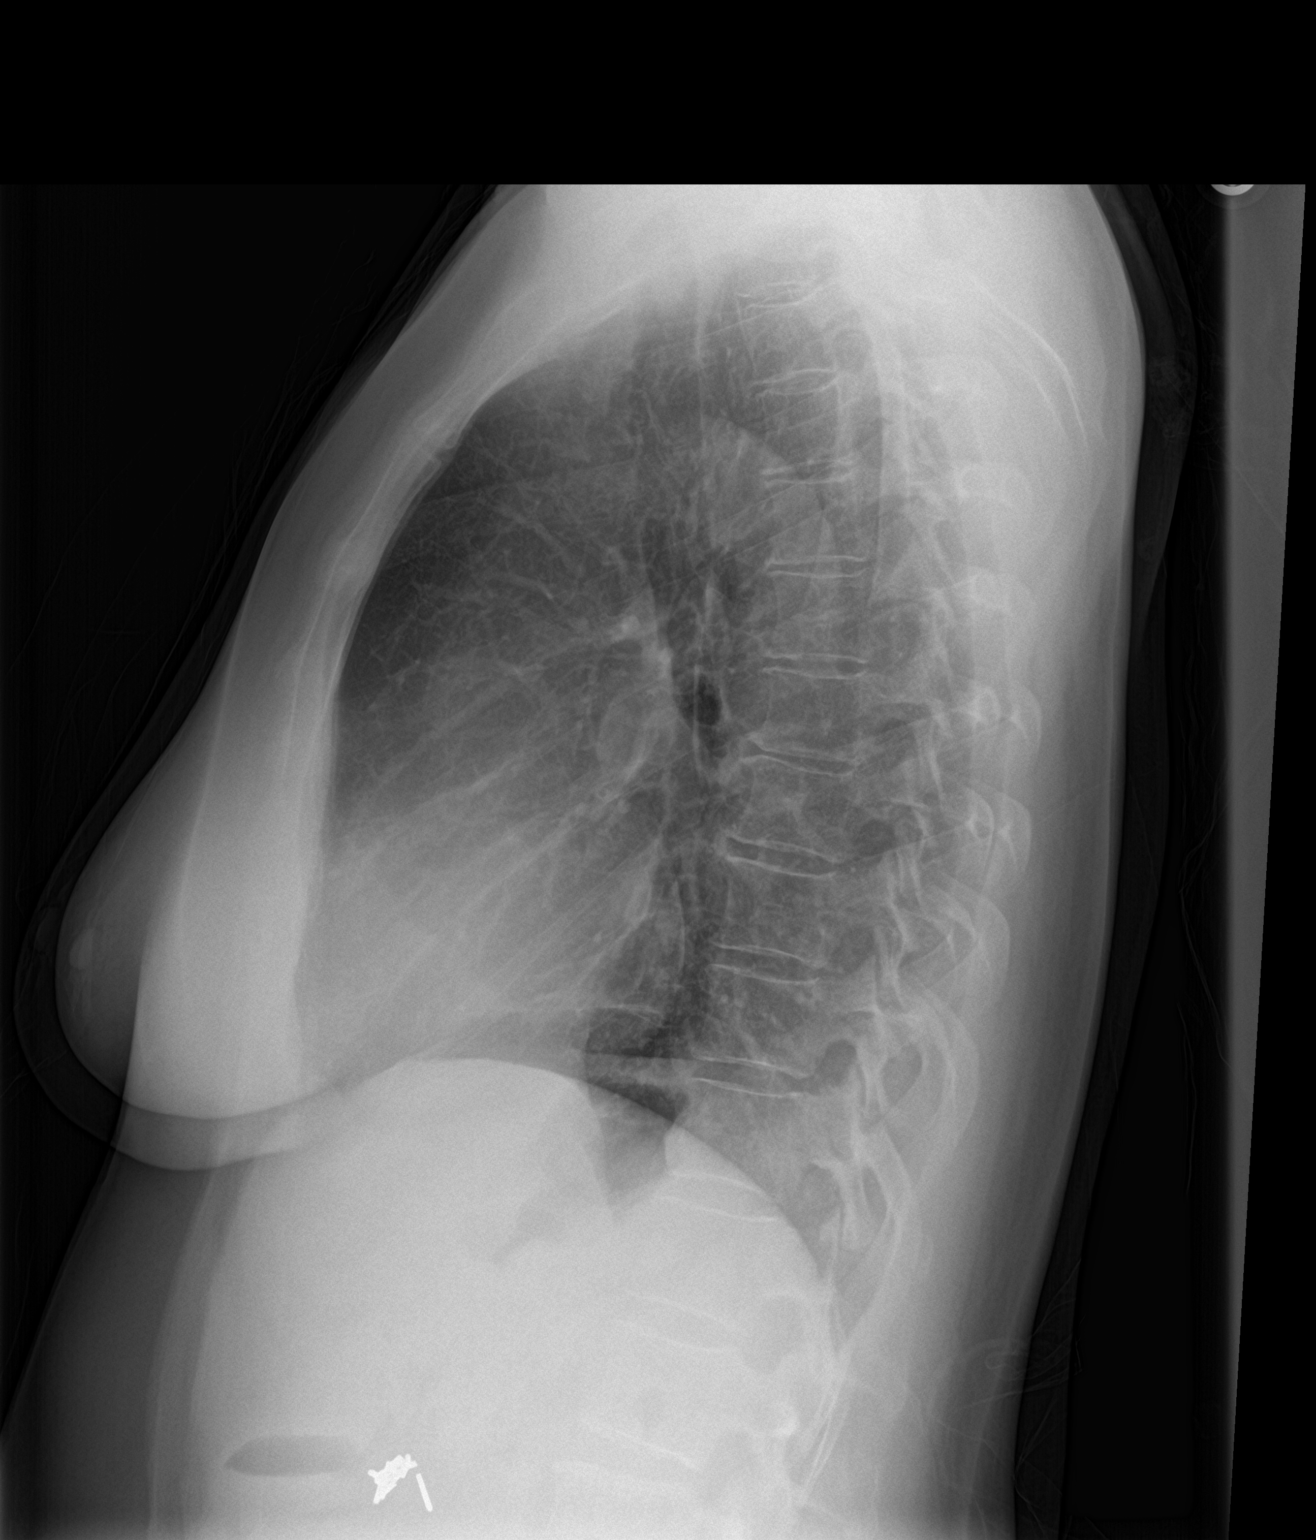

[2 of 2 positions shown; findings below may reference images not displayed]

FINDINGS: The heart size and mediastinal contours are within normal limits.
Linear opacity in the left lung base. Right lung is clear. No
pleural effusion or pneumothorax. The visualized skeletal structures
are unremarkable.
IMPRESSION: Linear opacity in the left lung base, favor atelectasis. Otherwise
no acute cardiopulmonary findings.

## 2021-12-22 ENCOUNTER — Other Ambulatory Visit: Payer: Self-pay | Admitting: Family Medicine

## 2021-12-22 DIAGNOSIS — I471 Supraventricular tachycardia, unspecified: Secondary | ICD-10-CM

## 2021-12-24 ENCOUNTER — Other Ambulatory Visit: Payer: Self-pay | Admitting: Family Medicine

## 2021-12-24 DIAGNOSIS — I471 Supraventricular tachycardia, unspecified: Secondary | ICD-10-CM

## 2021-12-25 ENCOUNTER — Other Ambulatory Visit: Payer: Self-pay | Admitting: Family Medicine

## 2021-12-25 DIAGNOSIS — I471 Supraventricular tachycardia, unspecified: Secondary | ICD-10-CM

## 2021-12-26 ENCOUNTER — Other Ambulatory Visit: Payer: Self-pay | Admitting: Family Medicine

## 2021-12-26 DIAGNOSIS — K581 Irritable bowel syndrome with constipation: Secondary | ICD-10-CM

## 2022-01-14 ENCOUNTER — Other Ambulatory Visit: Payer: Self-pay | Admitting: Physician Assistant

## 2022-01-14 ENCOUNTER — Other Ambulatory Visit: Payer: Self-pay | Admitting: Family Medicine

## 2022-01-14 DIAGNOSIS — I471 Supraventricular tachycardia, unspecified: Secondary | ICD-10-CM

## 2022-01-18 MED ORDER — EMGALITY 120 MG/ML ~~LOC~~ SOAJ: 1.0000 mL | SUBCUTANEOUS | 11 refills | Status: DC

## 2022-04-05 ENCOUNTER — Telehealth: Payer: Self-pay | Admitting: Family Medicine

## 2022-04-05 NOTE — Telephone Encounter (Signed)
Discuss IBS and also need refills on medication before she leaves for [place] on next Saturday. I dont see any available appointments only acute. Should she see another provider?

## 2022-04-05 NOTE — Telephone Encounter (Signed)
Pts

## 2022-04-05 NOTE — Telephone Encounter (Signed)
What medication does she need

## 2022-04-09 ENCOUNTER — Ambulatory Visit: Payer: Managed Care, Other (non HMO) | Admitting: Family Medicine

## 2022-04-09 ENCOUNTER — Encounter: Payer: Self-pay | Admitting: Family Medicine

## 2022-04-09 VITALS — BP 101/65 | HR 66 | Ht 64.0 in | Wt 145.0 lb

## 2022-04-09 DIAGNOSIS — Z1159 Encounter for screening for other viral diseases: Secondary | ICD-10-CM

## 2022-04-09 DIAGNOSIS — E781 Pure hyperglyceridemia: Secondary | ICD-10-CM

## 2022-04-09 DIAGNOSIS — F17200 Nicotine dependence, unspecified, uncomplicated: Secondary | ICD-10-CM

## 2022-04-09 DIAGNOSIS — K581 Irritable bowel syndrome with constipation: Secondary | ICD-10-CM

## 2022-04-09 DIAGNOSIS — I471 Supraventricular tachycardia, unspecified: Secondary | ICD-10-CM

## 2022-04-09 DIAGNOSIS — Z114 Encounter for screening for human immunodeficiency virus [HIV]: Secondary | ICD-10-CM

## 2022-04-09 MED ORDER — NEBIVOLOL HCL 2.5 MG PO TABS: 2.5000 mg | ORAL_TABLET | Freq: Every day | ORAL | 3 refills | Status: DC

## 2022-04-09 NOTE — Assessment & Plan Note (Addendum)
Doing well with nebivolol 2.5 mg.  Refills sent.  We did discuss possible trial off the medication at some point this year since she really feels like she is nail down some of the triggers.

## 2022-04-09 NOTE — Progress Notes (Signed)
Established Patient Office Visit  Subjective   Patient ID: Anne Mcdonald, female    DOB: Dec 27, 1960  Age: 62 y.o. MRN: [id number]  Chief Complaint  Patient presents with   Medication Refill    HPI Here today to follow-up on her PSVT.  She says she has noticed a big difference in frequency of symptoms since she ran out of her vitamin D about a year ago.  She does not have a chance to pick it up but noticed that her symptoms pretty much most resolved after she stopped the vitamin D.  She also noticed that it was occurring occasionally when she had been drinking electrolyte replacement drinks like propel etc. so she quit drinking Ozempic switch to pretty much water.  She is been a little nervous to come off of the nebivolol  Unfortunately she is also been having a flare with her IBS she said she got into a really good rhythm with the Linzess and was having a bowel movement almost daily after lunch.  But she got put on an NSAID after having a knee injury this fall and then ended up having dental surgery and ever since then her bowels have been off.  She goes from no bowel movement for couple days to having loose stools.  This morning she had a bowel movement but had to strain.  When she gets more constipated she starts to feel nauseated and sick on her stomach.     ROS    Objective:     BP 101/65   Pulse 66   Ht '5\' 4"'$  (1.626 m)   Wt 145 lb (65.8 kg)   LMP 07/17/2016   SpO2 100%   BMI 24.89 kg/m    Physical Exam Vitals and nursing note reviewed.  Constitutional:      Appearance: She is well-developed.  HENT:     Head: Normocephalic and atraumatic.  Cardiovascular:     Rate and Rhythm: Normal rate and regular rhythm.     Heart sounds: Normal heart sounds.  Pulmonary:     Effort: Pulmonary effort is normal.     Breath sounds: Normal breath sounds.  Skin:    General: Skin is warm and dry.  Neurological:     Mental Status: She is alert and oriented to person, place,  and time.  Psychiatric:        Behavior: Behavior normal.      No results found for any visits on 04/09/22.    The 10-year ASCVD risk score (Arnett DK, et al., 2019) is: 4.5%    Assessment & Plan:   Problem List Items Addressed This Visit       Cardiovascular and Mediastinum   Paroxysmal SVT (supraventricular tachycardia)    Doing well with nebivolol 2.5 mg.  Refills sent.  We did discuss possible trial off the medication at some point this year since she really feels like she is nail down some of the triggers.      Relevant Medications   nebivolol (BYSTOLIC) 2.5 MG tablet   Other Relevant Orders   Lipid Panel w/reflex Direct LDL   COMPLETE METABOLIC PANEL WITH GFR   CBC     Digestive   Irritable bowel syndrome with constipation    We discussed a couple of options including possibly switching medications and then eventually switching back to see if this hits the reset button she is due for full colonoscopy in about 2 months so she will call and get that scheduled as  well we also discussed maybe doing a full cleanout to try to get the constipation cleared out and then see if that resets the cycling.  Also discussed a trial of IBgard which can more help with his abdominal bloating and discomfort but will not necessarily help with the regularity of her stools.        Other   HYPERTRIGLYCERIDEMIA - Primary   Relevant Medications   nebivolol (BYSTOLIC) 2.5 MG tablet   Other Relevant Orders   Lipid Panel w/reflex Direct LDL   COMPLETE METABOLIC PANEL WITH GFR   CBC   Other Visit Diagnoses     Encounter for hepatitis C screening test for low risk patient       Relevant Orders   Hepatitis C Antibody   Screening for HIV (human immunodeficiency virus)       Relevant Orders   HIV antibody (with reflex)      Reminded her she is due for repeat colonoscopy in March this year it is her 3-year follow-up.  She says she is already received a phone call from digestive health  specialists.  She will give them a call back.  Return in about 1 year (around 04/10/2023).    Beatrice Lecher, MD

## 2022-04-09 NOTE — Patient Instructions (Addendum)
Recommend a trial of IB Guard  for your IBS   1. About peppermint oil Peppermint oil is a type of medicine called an antispasmodic. It helps relieve stomach cramps, bloating and farting (flatulence), particularly if you have irritable bowel syndrome (IBS). It works by helping the muscle of the bowel wall to relax.  Peppermint oil comes as capsules. It's available on prescription or to buy from a pharmacy or a shop.  2. Key facts The usual dose of peppermint oil is 1 or 2 capsules, taken 3 times a day. It's best to take it around 1 hour before meals. Wait at least 2 hours between taking a dose of peppermint oil and taking an indigestion medicine. This allows the peppermint oil capsules to work properly. It will start to work within a few hours but it could take up to 1 to 2 weeks to take full effect. Keep taking peppermint oil until your symptoms improve. Common side effects include heartburn and indigestion.

## 2022-04-09 NOTE — Assessment & Plan Note (Signed)
We discussed a couple of options including possibly switching medications and then eventually switching back to see if this hits the reset button she is due for full colonoscopy in about 2 months so she will call and get that scheduled as well we also discussed maybe doing a full cleanout to try to get the constipation cleared out and then see if that resets the cycling.  Also discussed a trial of IBgard which can more help with his abdominal bloating and discomfort but will not necessarily help with the regularity of her stools.

## 2022-04-09 NOTE — Telephone Encounter (Signed)
Pt was seen today.

## 2022-04-10 LAB — CBC
HCT: 39.7 % (ref 35.0–45.0)
Hemoglobin: 13.7 g/dL (ref 11.7–15.5)
MCH: 32.6 pg (ref 27.0–33.0)
MCHC: 34.5 g/dL (ref 32.0–36.0)
MCV: 94.5 fL (ref 80.0–100.0)
MPV: 9 fL (ref 7.5–12.5)
Platelets: 322 10*3/uL (ref 140–400)
RBC: 4.2 10*6/uL (ref 3.80–5.10)
RDW: 12.6 % (ref 11.0–15.0)
WBC: 9.5 10*3/uL (ref 3.8–10.8)

## 2022-04-10 LAB — COMPLETE METABOLIC PANEL WITH GFR
AG Ratio: 2.3 (calc) (ref 1.0–2.5)
ALT: 13 U/L (ref 6–29)
AST: 16 U/L (ref 10–35)
Albumin: 4.6 g/dL (ref 3.6–5.1)
Alkaline phosphatase (APISO): 65 U/L (ref 37–153)
BUN: 11 mg/dL (ref 7–25)
CO2: 28 mmol/L (ref 20–32)
Calcium: 9.8 mg/dL (ref 8.6–10.4)
Chloride: 104 mmol/L (ref 98–110)
Creat: 0.7 mg/dL (ref 0.50–1.05)
Globulin: 2 g/dL (calc) (ref 1.9–3.7)
Glucose, Bld: 77 mg/dL (ref 65–99)
Potassium: 4.1 mmol/L (ref 3.5–5.3)
Sodium: 139 mmol/L (ref 135–146)
Total Bilirubin: 0.6 mg/dL (ref 0.2–1.2)
Total Protein: 6.6 g/dL (ref 6.1–8.1)
eGFR: 98 mL/min/{1.73_m2} (ref >=60)

## 2022-04-10 LAB — LIPID PANEL W/REFLEX DIRECT LDL
Cholesterol: 191 mg/dL (ref <200)
HDL: 59 mg/dL (ref >=50)
LDL Cholesterol (Calc): 109 mg/dL (calc) — ABNORMAL HIGH
Non-HDL Cholesterol (Calc): 132 mg/dL (calc) — ABNORMAL HIGH (ref <130)
Total CHOL/HDL Ratio: 3.2 (calc) (ref <5.0)
Triglycerides: 124 mg/dL (ref <150)

## 2022-04-10 LAB — HEPATITIS C ANTIBODY: Hepatitis C Ab: NONREACTIVE

## 2022-04-10 LAB — HIV ANTIBODY (ROUTINE TESTING W REFLEX): HIV 1&2 Ab, 4th Generation: NONREACTIVE

## 2022-04-10 NOTE — Progress Notes (Signed)
Anne Mcdonald, your LDL cholesterol is mildly elevated similar to about 2 years ago.  You got it down under 100 last year which was awesome so just encourage you to continue to work towards that again.  Your blood count metabolic panel look great.  Negative for hepatitis C and HIV.  We just test for these once in your lifetime.  I would recommend considering a lung cancer screening test.  Because you are a smoker you do qualify for low-dose CAT scan of the chest to evaluate for lung cancer yearly.  If that something that you would consider then please let me know and I can always place a referral to our lung cancer screening nurse and have her contact you.

## 2022-04-14 IMAGING — MR MR HEAD WO/W CM
10 series · 48 of 48 positions shown · IV contrast (gadavist)
Comparison: None.

CLINICAL DATA: Memory loss.

EXAM:
MRI HEAD WITHOUT AND WITH CONTRAST
TECHNIQUE: Multiplanar, multiecho pulse sequences of the brain and surrounding
structures were obtained without and with intravenous contrast.
CONTRAST:  6.7mL GADAVIST GADOBUTROL 1 MMOL/ML IV SOLN

[Series 2: T1 · sagittal · 5.0mm · 0.90mm/px · 2 of 25 slices shown (1 of 2)]
[im 1/25]
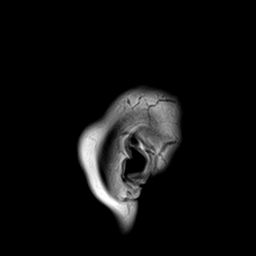
[im 25/25]
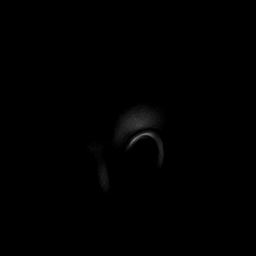

[Series 3: DWI · axial · 3.0mm · 1.88mm/px · z∈[-41,+100]mm · 8 of 88 slices shown (1 of 2)]
[im 1/88]
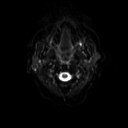
[im 13/88]
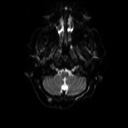
[im 25/88]
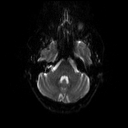
[im 38/88]
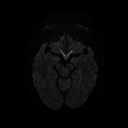
[im 50/88]
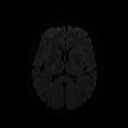
[im 63/88]
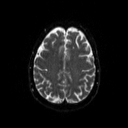
[im 75/88]
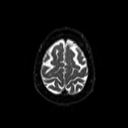
[im 88/88]
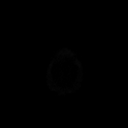

[Series 4: DWI · axial · 3.0mm · 1.88mm/px · z∈[-41,+100]mm · 4 of 43 slices shown (2 of 2)]
[im 1/43]
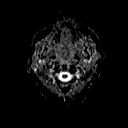
[im 15/43]
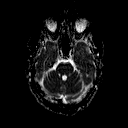
[im 29/43]
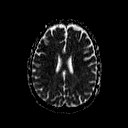
[im 43/43]
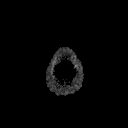

[Series 5: T2 · axial · 5.0mm · 0.66mm/px · z∈[-43,+100]mm · 3 of 25 slices shown (1 of 3)]
[im 1/25]
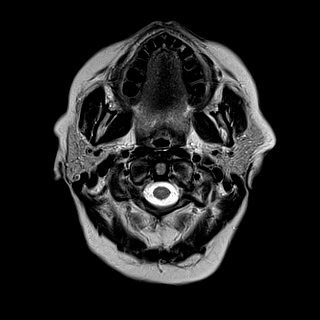
[im 13/25]
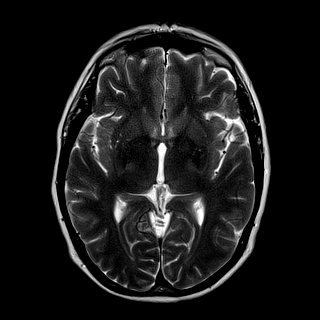
[im 25/25]
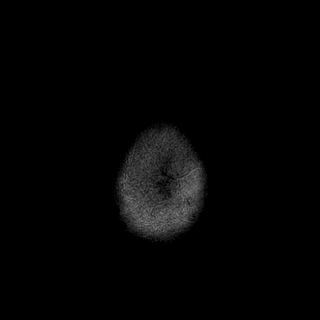

[Series 6: T2 · axial · 5.0mm · 0.41mm/px · z∈[-43,+100]mm · 3 of 25 slices shown (2 of 3)]
[im 1/25]
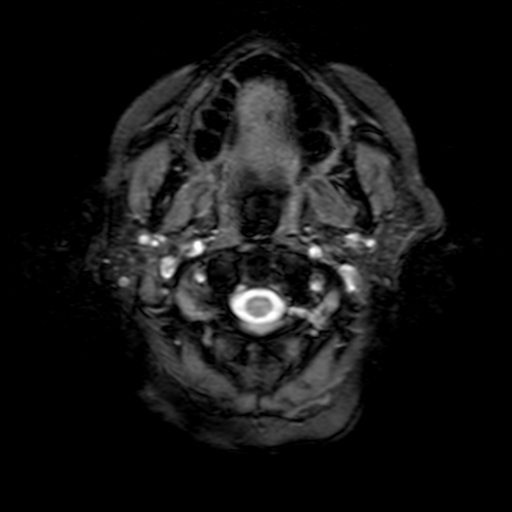
[im 13/25]
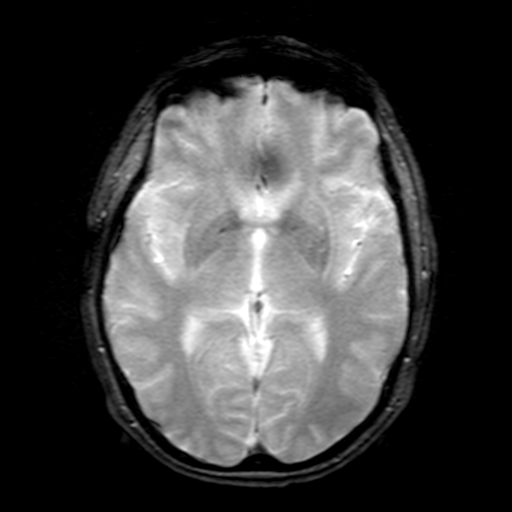
[im 25/25]
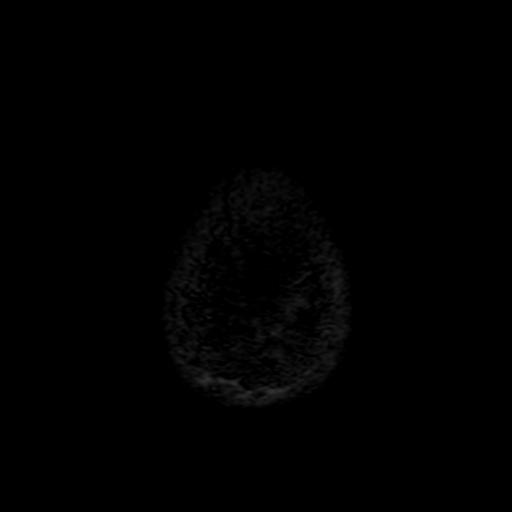

[Series 7: FLAIR · axial · 3.0mm · 0.41mm/px · z∈[-41,+97]mm · 4 of 36 slices shown]
[im 1/36]
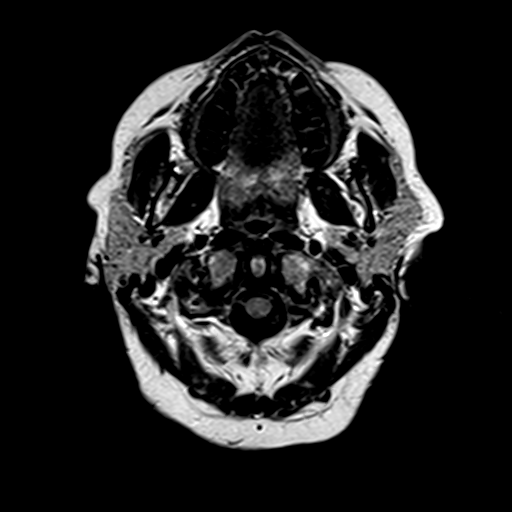
[im 12/36]
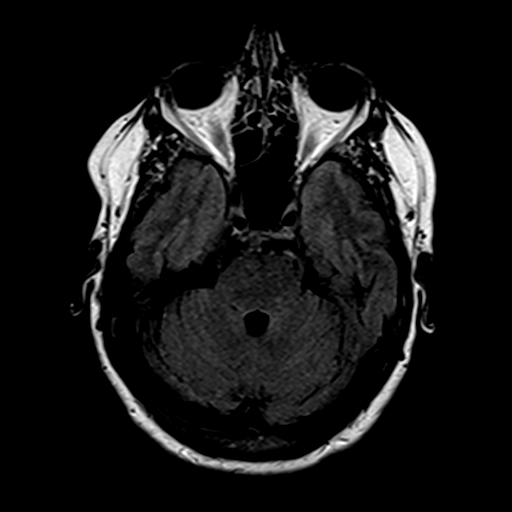
[im 24/36]
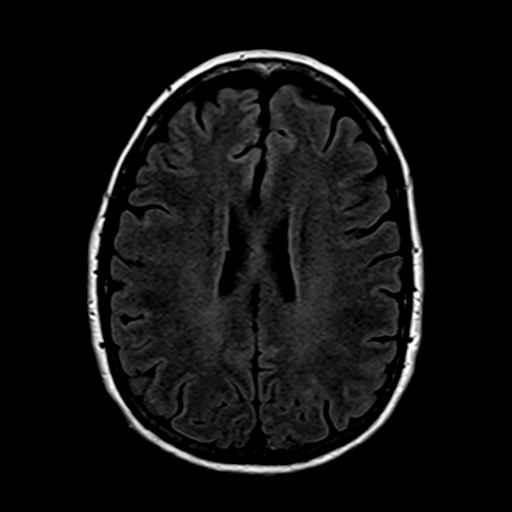
[im 36/36]
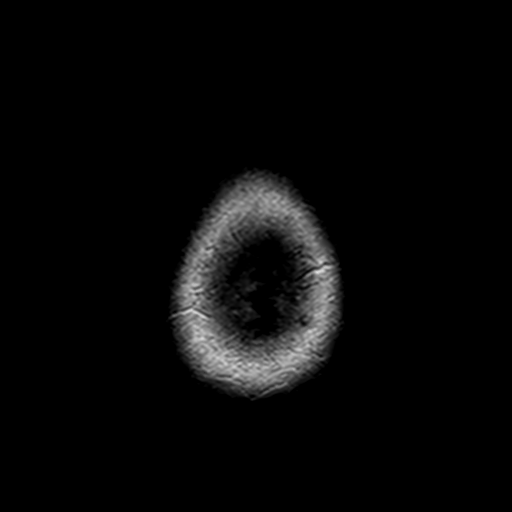

[Series 8: t1_3d_tra · axial · 2.0mm · 0.90mm/px · z∈[-56,+116]mm · 9 of 88 slices shown]
[im 1/88]
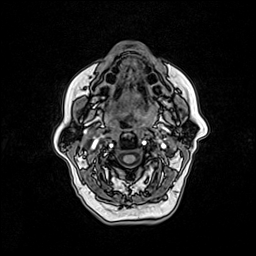
[im 11/88]
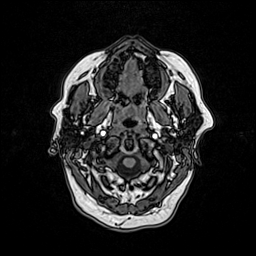
[im 22/88]
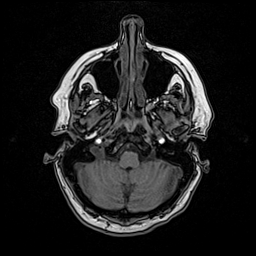
[im 33/88]
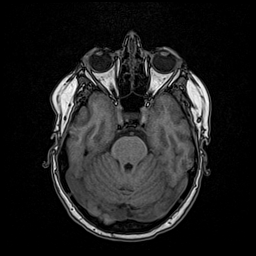
[im 44/88]
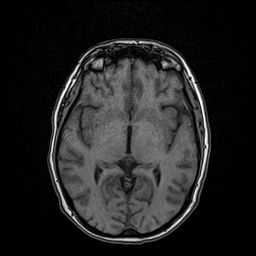
[im 55/88]
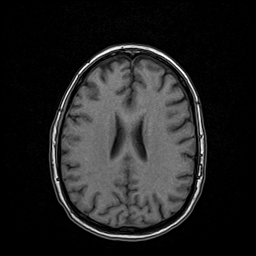
[im 66/88]
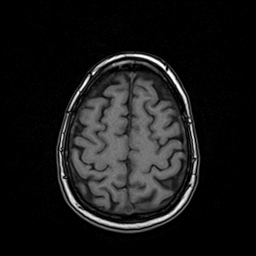
[im 77/88]
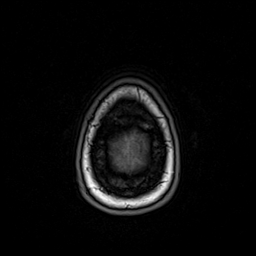
[im 88/88]
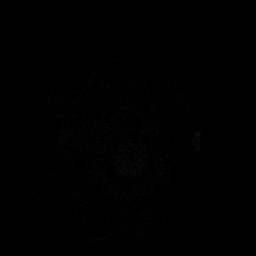

[Series 9: T2 · coronal · 5.0mm · 0.66mm/px · 3 of 28 slices shown (3 of 3)]
[im 1/28]
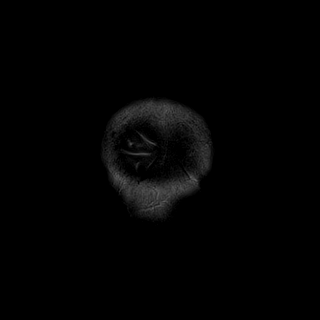
[im 14/28]
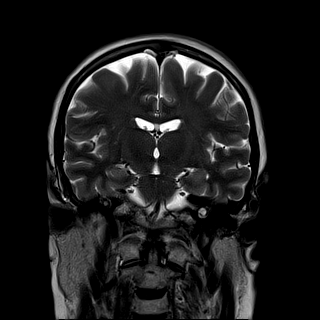
[im 28/28]
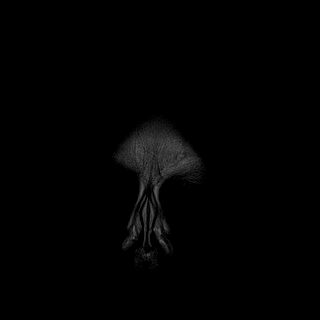

[Series 10: t1_3d_tra +c · axial · 2.0mm · 0.90mm/px · z∈[-56,+116]mm · 9 of 88 slices shown]
[im 1/88]
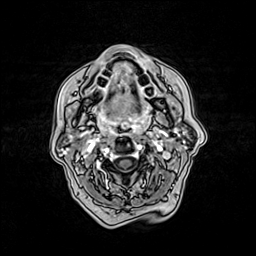
[im 11/88]
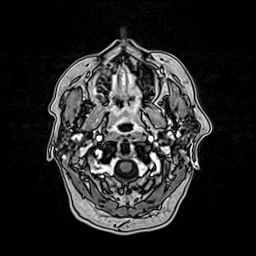
[im 22/88]
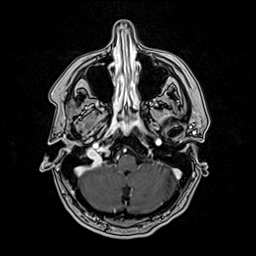
[im 33/88]
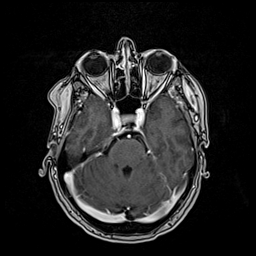
[im 44/88]
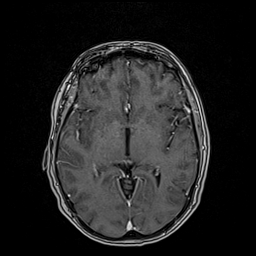
[im 55/88]
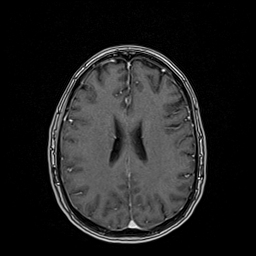
[im 66/88]
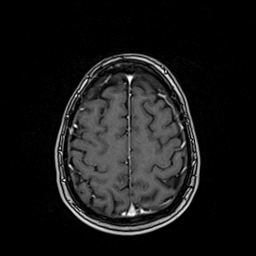
[im 77/88]
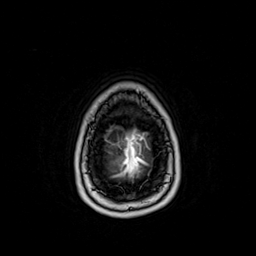
[im 88/88]
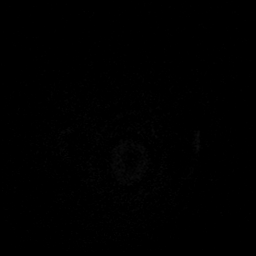

[Series 11: T1 · coronal · 5.0mm · 0.82mm/px · 3 of 28 slices shown (2 of 2)]
[im 1/28]
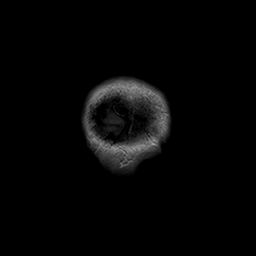
[im 14/28]
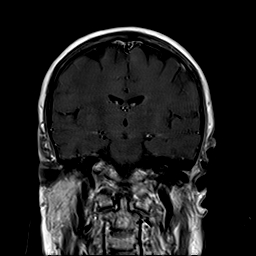
[im 28/28]
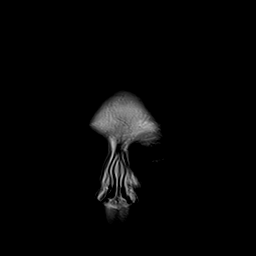

[48 of 48 positions shown; findings below may reference images not displayed]

FINDINGS: Brain: No acute infarction, hemorrhage, hydrocephalus or extra-axial
collection. A 5 mm well-defined ovoid structure within the deep
white matter of the left frontal lobe without evidence of contrast
enhancement, likely representing a neuroglial cyst. Few punctate
foci of T2 hyperintensity are seen within the white matter of the
cerebral hemispheres, nonspecific. No focus of abnormal contrast
enhancement.

Vascular: Normal flow voids.

Skull and upper cervical spine: Normal marrow signal.

Sinuses/Orbits: Mild mucosal thickening of the ethmoid cells. The
orbits are maintained.

Other: None.
IMPRESSION: 1. No acute intracranial abnormality or abnormal contrast
enhancement.
2. A 5 mm well-defined ovoid structure within the deep white matter
of the left frontal lobe without evidence of contrast enhancement,
likely representing a neuroglial cyst.
3. Few punctate foci of T2 hyperintensity within the white matter of
the cerebral hemispheres, nonspecific but may represent mild chronic
microvascular ischemic changes.

## 2022-04-16 NOTE — Progress Notes (Signed)
      Ambulatory Referral Lung Cancer Screening [facility]         Referral Priority:Routine         Referral Type:Consultation         Referral Reason:Specialty Services Required         Number of Visits Requested:1

## 2022-04-16 NOTE — Addendum Note (Signed)
Addended by: Beatrice Lecher D on: 04/16/2022 05:28 PM   Modules accepted: Orders

## 2022-04-27 ENCOUNTER — Other Ambulatory Visit: Payer: Self-pay | Admitting: Family Medicine

## 2022-04-27 DIAGNOSIS — K581 Irritable bowel syndrome with constipation: Secondary | ICD-10-CM

## 2022-05-10 ENCOUNTER — Ambulatory Visit: Payer: Managed Care, Other (non HMO) | Admitting: Family Medicine

## 2022-05-10 ENCOUNTER — Ambulatory Visit (INDEPENDENT_AMBULATORY_CARE_PROVIDER_SITE_OTHER): Payer: Managed Care, Other (non HMO)

## 2022-05-10 VITALS — BP 131/83 | HR 60 | Ht 64.0 in | Wt 143.0 lb

## 2022-05-10 DIAGNOSIS — R079 Chest pain, unspecified: Secondary | ICD-10-CM

## 2022-05-10 DIAGNOSIS — K581 Irritable bowel syndrome with constipation: Secondary | ICD-10-CM

## 2022-05-10 NOTE — Patient Instructions (Signed)
Push fluids. Continue linzess.  We are checking your thyroid function today.   Your EKG has not changed from your previous EKG.

## 2022-05-11 LAB — TSH+FREE T4: TSH W/REFLEX TO FT4: 2.19 mIU/L (ref 0.40–4.50)

## 2022-05-12 ENCOUNTER — Encounter: Payer: Self-pay | Admitting: Family Medicine

## 2022-05-12 DIAGNOSIS — R079 Chest pain, unspecified: Secondary | ICD-10-CM | POA: Insufficient documentation

## 2022-05-12 NOTE — Assessment & Plan Note (Signed)
She feels that she has had more constipation despite use of Linzess.  Recommend she stay well-hydrated.  Checking TSH today.  Continue Linzess at current strength.  Additionally she may use MiraLAX or mag citrate to do a full cleanout.

## 2022-05-12 NOTE — Assessment & Plan Note (Signed)
EKG completed showing sinus bradycardia.  No significant change from previous EKG in July 2021.  Pain is more in the lateral rib area.  Doubt cardiac etiology.  Chest x-ray ordered.  Question possible costochondritis.

## 2022-05-12 NOTE — Progress Notes (Signed)
Anne Mcdonald - 62 y.o. female MRN [id number]  Date of birth: 07-04-1960  Subjective Chief Complaint  Patient presents with   Follow-up    Lung cancer screening     HPI Anne Mcdonald is a 62 year old female here today with complaint of chest pain.  She has had chest pain over the past week.  Pain is located on the left side with some radiation into the left and right shoulders.  She denies associated shortness of breath.  She has not had palpitations dizziness.  She has had some nausea but is not sure if this is related to her IBS-D.  Her symptoms do not increase with exertional activity.  No fever or chills.  She is concerned about cardiac etiology.  She has had problems with IBS-D.  Using Linzess which does seem to help but does continue to have problems with constipation.  She has had abdominal cramping associated with her IBS.  She does report trying peppermint oil (IBgard) without much improvement.  She does feel that she is drinking plenty of fluids.  ROS:  A comprehensive ROS was completed and negative except as noted per HPI  Allergies  Allergen Reactions   Other Rash   Trulance [Plecanatide] Other (See Comments)    Abdominal pain    Past Medical History:  Diagnosis Date   Fibroid uterus    PUD (peptic ulcer disease)     Past Surgical History:  Procedure Laterality Date   CESAREAN SECTION     CHOLECYSTECTOMY     TOTAL ABDOMINAL HYSTERECTOMY W/ BILATERAL SALPINGOOPHORECTOMY  08/2016    Social History   Socioeconomic History   Marital status: Married    Spouse name: Not on file   Number of children: Not on file   Years of education: Not on file   Highest education level: Not on file  Occupational History   Not on file  Tobacco Use   Smoking status: Every Day    Packs/day: 1.00    Types: Cigarettes    Passive exposure: Never   Smokeless tobacco: Never   Tobacco comments:    1 pack a day   Vaping Use   Vaping Use: Never used  Substance and  Sexual Activity   Alcohol use: No   Drug use: No   Sexual activity: Not Currently    Birth control/protection: None, Surgical  Other Topics Concern   Not on file  Social History Narrative   Not on file   Social Determinants of Health   Financial Resource Strain: Not on file  Food Insecurity: Not on file  Transportation Needs: Not on file  Physical Activity: Not on file  Stress: Not on file  Social Connections: Not on file    Family History  Adopted: Yes  Family history unknown: Yes    Health Maintenance  Topic Date Due   Lung Cancer Screening  Never done   INFLUENZA VACCINE  06/23/2022 (Originally 10/23/2021)   COVID-19 Vaccine (1) 02/28/2023 (Originally 05/07/1961)   Pneumococcal Vaccine 50-33 Years old (1 of 2 - PCV) 04/10/2023 (Originally 11/04/1965)   Zoster Vaccines- Shingrix (1 of 2) 07/09/2023 (Originally 11/04/2009)   COLONOSCOPY (Pts 45-61yr Insurance coverage will need to be confirmed)  06/08/2022   DTaP/Tdap/Td (2 - Td or Tdap) 03/05/2023   MAMMOGRAM  11/02/2023   Hepatitis C Screening  Completed   HIV Screening  Completed   HPV VACCINES  Aged Out     ----------------------------------------------------------------------------------------------------------------------------------------------------------------------------------------------------------------- Physical Exam BP 131/83 (BP Location: Left Arm,  Patient Position: Sitting, Cuff Size: Normal)   Pulse 60   Ht 5' 4"$  (1.626 m)   Wt 143 lb 0.5 oz (64.9 kg)   LMP 07/17/2016   SpO2 100%   BMI 24.55 kg/m   Physical Exam Constitutional:      Appearance: Normal appearance.  HENT:     Head: Normocephalic and atraumatic.  Eyes:     General: No scleral icterus. Cardiovascular:     Rate and Rhythm: Normal rate and regular rhythm.  Pulmonary:     Effort: Pulmonary effort is normal.     Breath sounds: Normal breath sounds.     Comments: Tenderness palpation along the lateral ribs  bilaterally. Musculoskeletal:     Cervical back: Neck supple.  Neurological:     Mental Status: She is alert.  Psychiatric:        Mood and Affect: Mood normal.        Behavior: Behavior normal.     ------------------------------------------------------------------------------------------------------------------------------------------------------------------------------------------------------------------- Assessment and Plan  Irritable bowel syndrome with constipation She feels that she has had more constipation despite use of Linzess.  Recommend she stay well-hydrated.  Checking TSH today.  Continue Linzess at current strength.  Additionally she may use MiraLAX or mag citrate to do a full cleanout.  Chest pain EKG completed showing sinus bradycardia.  No significant change from previous EKG in July 2021.  Pain is more in the lateral rib area.  Doubt cardiac etiology.  Chest x-ray ordered.  Question possible costochondritis.   No orders of the defined types were placed in this encounter.   No follow-ups on file.    This visit occurred during the SARS-CoV-2 public health emergency.  Safety protocols were in place, including screening questions prior to the visit, additional usage of staff PPE, and extensive cleaning of exam room while observing appropriate contact time as indicated for disinfecting solutions.

## 2022-06-17 ENCOUNTER — Encounter: Payer: Self-pay | Admitting: Family Medicine

## 2022-06-17 ENCOUNTER — Telehealth: Payer: Self-pay | Admitting: Acute Care

## 2022-06-17 NOTE — Telephone Encounter (Signed)
Tasha called in to get Pt scheduled for lung cancer screening.

## 2022-06-18 NOTE — Telephone Encounter (Signed)
Left VM and call back number for lung cancer screening

## 2022-06-27 ENCOUNTER — Encounter: Payer: Self-pay | Admitting: Family Medicine

## 2022-06-27 DIAGNOSIS — F17210 Nicotine dependence, cigarettes, uncomplicated: Secondary | ICD-10-CM

## 2022-06-27 DIAGNOSIS — F17200 Nicotine dependence, unspecified, uncomplicated: Secondary | ICD-10-CM

## 2022-06-27 NOTE — Telephone Encounter (Signed)
CT order pended.

## 2022-06-27 NOTE — Telephone Encounter (Signed)
I will work on obtaining an [place] from [facility]. Patient has been notified of the latest outcome on her request.

## 2022-07-03 ENCOUNTER — Other Ambulatory Visit: Payer: Self-pay | Admitting: Family Medicine

## 2022-07-03 DIAGNOSIS — G2581 Restless legs syndrome: Secondary | ICD-10-CM

## 2022-07-08 ENCOUNTER — Encounter: Payer: Self-pay | Admitting: *Deleted

## 2022-07-09 ENCOUNTER — Ambulatory Visit: Payer: Managed Care, Other (non HMO)

## 2022-07-09 DIAGNOSIS — F17210 Nicotine dependence, cigarettes, uncomplicated: Secondary | ICD-10-CM

## 2022-07-09 DIAGNOSIS — F17200 Nicotine dependence, unspecified, uncomplicated: Secondary | ICD-10-CM

## 2022-07-11 ENCOUNTER — Ambulatory Visit: Payer: Managed Care, Other (non HMO)

## 2022-07-24 LAB — HM COLONOSCOPY

## 2022-08-03 ENCOUNTER — Other Ambulatory Visit: Payer: Self-pay | Admitting: Family Medicine

## 2022-08-03 DIAGNOSIS — K581 Irritable bowel syndrome with constipation: Secondary | ICD-10-CM

## 2022-09-02 ENCOUNTER — Encounter: Payer: Self-pay | Admitting: Medical-Surgical

## 2022-09-02 ENCOUNTER — Ambulatory Visit: Payer: Managed Care, Other (non HMO) | Admitting: Medical-Surgical

## 2022-09-02 VITALS — BP 117/75 | HR 65 | Ht 64.0 in | Wt 141.0 lb

## 2022-09-02 DIAGNOSIS — R1030 Lower abdominal pain, unspecified: Secondary | ICD-10-CM | POA: Diagnosis not present

## 2022-09-02 DIAGNOSIS — R5383 Other fatigue: Secondary | ICD-10-CM

## 2022-09-02 DIAGNOSIS — R350 Frequency of micturition: Secondary | ICD-10-CM | POA: Diagnosis not present

## 2022-09-02 LAB — POCT URINALYSIS DIP (CLINITEK)
Bilirubin, UA: NEGATIVE
Blood, UA: NEGATIVE
Glucose, UA: NEGATIVE mg/dL
Ketones, POC UA: NEGATIVE mg/dL
Leukocytes, UA: NEGATIVE
Nitrite, UA: NEGATIVE
Spec Grav, UA: >=1.03 — AB (ref 1.010–1.025)
Urobilinogen, UA: 0.2 E.U./dL
pH, UA: 6 (ref 5.0–8.0)

## 2022-09-02 NOTE — Progress Notes (Signed)
        Established patient visit  History, exam, impression, and plan:  1. Lower abdominal pain 2. Urinary frequency 3. Fatigue, unspecified type Pleasant 62 year old female presenting today with reports of not feeling well over the last few days.  She has been sleeping a lot more than she usually does.  Has had some urinary frequency as well as urgency over the last week.  Several days last week she felt that her urine was very hot as she voided.  She seen no blood in her urine but has been having some low back pain that wraps around to her thighs and lower abdomen at times.  She did have chills last night and has a headache today.  Notes that her back pain is located in the lower back on the left side.  She did take ibuprofen which seemed to help some however the back pain is back today.  She took a COVID test at home with negative results.  Notes that she has been outside doing a lot of yard work at her daughter's new house.  She was bitten by a tick the other day but they saw it and were able to remove it before it became engorged.  See below for physical exam.  POCT urinalysis positive for trace protein and elevated specific gravity.  Sending for culture today.  Checking CBC with differential and renal function.  Concern for significant urinary symptoms with back pain and generalized malaise.  Consider possible kidney stone although she does not have a history of this.  Ordering CT stone study for further investigation. - POCT URINALYSIS DIP (CLINITEK) - Urine Culture - CBC with Differential/Platelet - COMPLETE METABOLIC PANEL WITH GFR - CT RENAL STONE STUDY; Future  Physical Exam Vitals reviewed.  Constitutional:      General: She is not in acute distress.    Appearance: Normal appearance. She is normal weight. She is ill-appearing.  HENT:     Head: Normocephalic and atraumatic.  Cardiovascular:     Rate and Rhythm: Normal rate and regular rhythm.     Pulses: Normal pulses.     Heart  sounds: Normal heart sounds. No murmur heard.    No friction rub. No gallop.  Pulmonary:     Effort: Pulmonary effort is normal. No respiratory distress.     Breath sounds: Normal breath sounds. No wheezing.  Abdominal:     General: Bowel sounds are normal. There is no distension.     Palpations: Abdomen is soft. There is no mass.     Tenderness: There is no abdominal tenderness. There is no guarding or rebound.     Hernia: No hernia is present.  Skin:    General: Skin is warm and dry.  Neurological:     Mental Status: She is alert and oriented to person, place, and time.  Psychiatric:        Mood and Affect: Mood normal.        Behavior: Behavior normal.        Thought Content: Thought content normal.        Judgment: Judgment normal.     Procedures performed this visit: None.  Return if symptoms worsen or fail to improve.  __________________________________ Thayer Ohm, DNP, APRN, FNP-BC Primary Care and Sports Medicine [facility] [place]

## 2022-09-03 LAB — COMPLETE METABOLIC PANEL WITH GFR
AG Ratio: 2.2 (calc) (ref 1.0–2.5)
ALT: 11 U/L (ref 6–29)
AST: 14 U/L (ref 10–35)
Albumin: 4.3 g/dL (ref 3.6–5.1)
Alkaline phosphatase (APISO): 62 U/L (ref 37–153)
BUN: 14 mg/dL (ref 7–25)
CO2: 25 mmol/L (ref 20–32)
Calcium: 9.7 mg/dL (ref 8.6–10.4)
Chloride: 104 mmol/L (ref 98–110)
Creat: 0.82 mg/dL (ref 0.50–1.05)
Globulin: 2 g/dL (calc) (ref 1.9–3.7)
Glucose, Bld: 92 mg/dL (ref 65–99)
Potassium: 4.1 mmol/L (ref 3.5–5.3)
Sodium: 138 mmol/L (ref 135–146)
Total Bilirubin: 0.6 mg/dL (ref 0.2–1.2)
Total Protein: 6.3 g/dL (ref 6.1–8.1)
eGFR: 81 mL/min/{1.73_m2} (ref >=60)

## 2022-09-03 LAB — CBC WITH DIFFERENTIAL/PLATELET
Absolute Monocytes: 444 cells/uL (ref 200–950)
Basophils Absolute: 30 cells/uL (ref 0–200)
Basophils Relative: 0.4 %
Eosinophils Absolute: 59 cells/uL (ref 15–500)
Eosinophils Relative: 0.8 %
HCT: 38 % (ref 35.0–45.0)
Hemoglobin: 12.8 g/dL (ref 11.7–15.5)
Lymphs Abs: 2864 cells/uL (ref 850–3900)
MCH: 32.1 pg (ref 27.0–33.0)
MCHC: 33.7 g/dL (ref 32.0–36.0)
MCV: 95.2 fL (ref 80.0–100.0)
MPV: 8.9 fL (ref 7.5–12.5)
Monocytes Relative: 6 %
Neutro Abs: 4003 cells/uL (ref 1500–7800)
Neutrophils Relative %: 54.1 %
Platelets: 264 10*3/uL (ref 140–400)
RBC: 3.99 10*6/uL (ref 3.80–5.10)
RDW: 12.6 % (ref 11.0–15.0)
Total Lymphocyte: 38.7 %
WBC: 7.4 10*3/uL (ref 3.8–10.8)

## 2022-09-04 ENCOUNTER — Encounter: Payer: Self-pay | Admitting: Medical-Surgical

## 2022-09-04 LAB — URINE CULTURE
MICRO NUMBER:: 15062194
Result:: NO GROWTH
SPECIMEN QUALITY:: ADEQUATE

## 2022-09-04 NOTE — Telephone Encounter (Signed)
Task completed. Patient was advised via phone call and provided imaging direct number to schedule the CT study.

## 2022-09-05 ENCOUNTER — Ambulatory Visit: Payer: Managed Care, Other (non HMO)

## 2022-09-05 DIAGNOSIS — R1030 Lower abdominal pain, unspecified: Secondary | ICD-10-CM | POA: Diagnosis not present

## 2022-09-05 DIAGNOSIS — R350 Frequency of micturition: Secondary | ICD-10-CM

## 2022-09-06 ENCOUNTER — Telehealth: Payer: Self-pay

## 2022-09-06 MED ORDER — TRAMADOL HCL 50 MG PO TABS: 50.0000 mg | ORAL_TABLET | Freq: Three times a day (TID) | ORAL | 0 refills | Status: AC | PRN

## 2022-09-06 MED ORDER — PREDNISONE 50 MG PO TABS: ORAL_TABLET | ORAL | 0 refills | Status: DC

## 2022-09-06 MED ORDER — CYCLOBENZAPRINE HCL 10 MG PO TABS: 10.0000 mg | ORAL_TABLET | Freq: Three times a day (TID) | ORAL | 0 refills | Status: DC | PRN

## 2022-09-06 NOTE — Addendum Note (Signed)
Addended by: Jomarie Longs on: 09/06/2022 03:58 PM   Modules accepted: Orders

## 2022-09-06 NOTE — Telephone Encounter (Signed)
I called patient and she states she told Joy this was a herniated disk. She is thankful for the Tramadol. She wanted to know if she could also receive prednisone and a muscle relaxer.

## 2022-09-06 NOTE — Telephone Encounter (Signed)
..  PDMP reviewed during this encounter. No concerns.  Left flank pain. CT scan reassuring.  Tramadol for as needed pain.

## 2022-09-06 NOTE — Telephone Encounter (Signed)
Anne Mcdonald called and left a message stating she saw the CT report. She is leaving tomorrow to go out of town. She is requesting pain medication.   Joy and Dr Linford Arnold is out of the office.

## 2022-09-06 NOTE — Telephone Encounter (Signed)
Patient informed. 

## 2022-09-10 ENCOUNTER — Encounter: Payer: Self-pay | Admitting: Family Medicine

## 2022-09-18 ENCOUNTER — Other Ambulatory Visit: Payer: Self-pay | Admitting: Obstetrics and Gynecology

## 2022-09-18 DIAGNOSIS — Z1231 Encounter for screening mammogram for malignant neoplasm of breast: Secondary | ICD-10-CM

## 2022-10-03 ENCOUNTER — Ambulatory Visit: Payer: Managed Care, Other (non HMO) | Admitting: Family Medicine

## 2022-10-03 ENCOUNTER — Ambulatory Visit: Payer: Managed Care, Other (non HMO)

## 2022-10-03 ENCOUNTER — Encounter: Payer: Self-pay | Admitting: Family Medicine

## 2022-10-03 VITALS — BP 111/68 | HR 65 | Ht 64.0 in | Wt 141.0 lb

## 2022-10-03 DIAGNOSIS — Z720 Tobacco use: Secondary | ICD-10-CM | POA: Diagnosis not present

## 2022-10-03 DIAGNOSIS — M545 Low back pain, unspecified: Secondary | ICD-10-CM | POA: Diagnosis not present

## 2022-10-03 MED ORDER — CYCLOBENZAPRINE HCL 10 MG PO TABS: 10.0000 mg | ORAL_TABLET | Freq: Three times a day (TID) | ORAL | 0 refills | Status: DC | PRN

## 2022-10-03 MED ORDER — VARENICLINE TARTRATE 1 MG PO TABS
ORAL_TABLET | ORAL | 2 refills | Status: DC
Start: 2022-10-03 — End: 2022-12-16

## 2022-10-03 MED ORDER — HYDROCODONE-ACETAMINOPHEN 5-325 MG PO TABS: 1.0000 | ORAL_TABLET | Freq: Four times a day (QID) | ORAL | 0 refills | Status: DC | PRN

## 2022-10-03 MED ORDER — PREDNISONE 20 MG PO TABS
40.0000 mg | ORAL_TABLET | Freq: Every day | ORAL | 0 refills | Status: DC
Start: 2022-10-03 — End: 2023-04-10

## 2022-10-03 NOTE — Progress Notes (Signed)
Acute Office Visit  Subjective:     Patient ID: Anne Mcdonald, female    DOB: [birth year], 62 y.o.   MRN: [id number]  Chief Complaint  Patient presents with   Back Pain    HPI Patient is in today for low back pain  x 3 weeks.  She was given flexeril, prednisone and tramadol.  Still having some left sided pain.  She has a history of a recurrent back pain normally when it flares up it radiates around to both hips.  She says about 3 weeks ago she was having some GI stomach upset as well.  That eventually got better and then realized that her back was still hurting.  She did get relief with the prednisone and muscle relaxer.  She never actually took the tramadol because she read the side effects and got nervous about it.  She felt better for about a week and then when she got back from her trip she started doing some yard work and some heavy lifting and bending over.  And it started to flareup again but this time her back pain was a little bit higher and was not radiating around to her hips.  It was just a little bit more lateral.  So she was concerned and came in today.  Does get a little bit of relief with ibuprofen.  Bacot abuse-she is interested in quitting smoking she says she smoked for most 40 years at this point.  ROS      Objective:    BP 111/68   Pulse 65   Ht 5\' 4"  (1.626 m)   Wt 141 lb (64 kg)   LMP 07/17/2016   SpO2 99%   BMI 24.20 kg/m    Physical Exam Vitals reviewed.  Constitutional:      Appearance: She is well-developed.  HENT:     Head: Normocephalic and atraumatic.  Eyes:     Conjunctiva/sclera: Conjunctivae normal.  Cardiovascular:     Rate and Rhythm: Normal rate.  Pulmonary:     Effort: Pulmonary effort is normal.  Musculoskeletal:     Comments: Creased lumbar flexion and pain with extension.  She also had pain with rotation to the right, no pain to the left.  Symmetric rotation.  Normal sidebending right and left.  Skin:    General: Skin is  dry.     Coloration: Skin is not pale.  Neurological:     Mental Status: She is alert and oriented to person, place, and time.  Psychiatric:        Behavior: Behavior normal.     No results found for any visits on 10/03/22.      Assessment & Plan:   Problem List Items Addressed This Visit   None Visit Diagnoses     Acute midline low back pain without sciatica    -  Primary   Relevant Medications   predniSONE (DELTASONE) 20 MG tablet   cyclobenzaprine (FLEXERIL) 10 MG tablet   HYDROcodone-acetaminophen (NORCO/VICODIN) 5-325 MG tablet   Other Relevant Orders   DG Lumbar Spine Complete   Tobacco abuse       Relevant Medications   varenicline (CHANTIX) 1 MG tablet      Acute low back pain on top of chronic.  We discussed maybe getting a plain film today especially since the nature of her typical back pain is a little different this time.  Will call with results once available.  Consider formal physical therapy in the short-term I  did give her another round of prednisone and refill the Flexeril which has been helpful for her will call with results once available.  Tobacco abuse-she has tried to quit before with nicotine patches and Nicorette gum.  She really would like to quit permanently.  We discussed Chantix as an option.  Prescription sent to pharmacy.  Meds ordered this encounter  Medications   varenicline (CHANTIX) 1 MG tablet    Sig: Take 0.5 tablets (0.5 mg total) by mouth daily for 3 days, THEN 0.5 tablets (0.5 mg total) 2 (two) times daily for 4 days, THEN 1 tablet (1 mg total) 2 (two) times daily for 23 days.    Dispense:  60 tablet    Refill:  2   predniSONE (DELTASONE) 20 MG tablet    Sig: Take 2 tablets (40 mg total) by mouth daily with breakfast.    Dispense:  10 tablet    Refill:  0   cyclobenzaprine (FLEXERIL) 10 MG tablet    Sig: Take 1 tablet (10 mg total) by mouth 3 (three) times daily as needed for muscle spasms.    Dispense:  30 tablet    Refill:  0    HYDROcodone-acetaminophen (NORCO/VICODIN) 5-325 MG tablet    Sig: Take 1 tablet by mouth every 6 (six) hours as needed for moderate pain.    Dispense:  15 tablet    Refill:  0    No follow-ups on file.  I spent 30 minutes on the day of the encounter to include pre-visit record review, face-to-face time with the patient and post visit ordering of test.  Nani Gasser, MD

## 2022-10-04 ENCOUNTER — Encounter: Payer: Self-pay | Admitting: Family Medicine

## 2022-10-04 NOTE — Progress Notes (Signed)
Bryson, x-ray rules out any fracture which is great you do have a degenerative disc at L5-S1.  So I think this is where your pain could be radiating from.  I would recommend formal physical therapy for your lumbar spine for a few weeks to see if it is improving.  If you are okay with this please let me know and we can place referral.  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  And a little bit of arthritis and spurring from L2 all the way down to L5.

## 2022-11-04 ENCOUNTER — Ambulatory Visit
Admission: RE | Admit: 2022-11-04 | Discharge: 2022-11-04 | Disposition: A | Discharge status: Home / Self Care | Payer: Managed Care, Other (non HMO) | Source: Ambulatory Visit | Attending: Obstetrics and Gynecology | Admitting: Obstetrics and Gynecology

## 2022-11-04 DIAGNOSIS — Z1231 Encounter for screening mammogram for malignant neoplasm of breast: Secondary | ICD-10-CM

## 2022-11-06 ENCOUNTER — Other Ambulatory Visit: Payer: Self-pay | Admitting: Obstetrics and Gynecology

## 2022-11-06 DIAGNOSIS — R928 Other abnormal and inconclusive findings on diagnostic imaging of breast: Secondary | ICD-10-CM

## 2022-11-28 ENCOUNTER — Ambulatory Visit: Payer: Managed Care, Other (non HMO)

## 2022-11-28 ENCOUNTER — Ambulatory Visit
Admission: RE | Admit: 2022-11-28 | Discharge: 2022-11-28 | Disposition: A | Discharge status: Home / Self Care | Payer: Managed Care, Other (non HMO) | Source: Ambulatory Visit | Attending: Obstetrics and Gynecology | Admitting: Obstetrics and Gynecology

## 2022-11-28 DIAGNOSIS — R928 Other abnormal and inconclusive findings on diagnostic imaging of breast: Secondary | ICD-10-CM

## 2022-11-29 ENCOUNTER — Encounter: Payer: Managed Care, Other (non HMO) | Admitting: Physician Assistant

## 2022-12-13 ENCOUNTER — Other Ambulatory Visit: Payer: Self-pay | Admitting: Physician Assistant

## 2022-12-13 ENCOUNTER — Ambulatory Visit: Payer: Managed Care, Other (non HMO) | Admitting: Physician Assistant

## 2022-12-13 ENCOUNTER — Other Ambulatory Visit: Payer: Self-pay | Admitting: Family Medicine

## 2022-12-13 ENCOUNTER — Encounter: Payer: Self-pay | Admitting: Physician Assistant

## 2022-12-13 VITALS — BP 109/72 | HR 60 | Wt 142.0 lb

## 2022-12-13 DIAGNOSIS — G43009 Migraine without aura, not intractable, without status migrainosus: Secondary | ICD-10-CM | POA: Diagnosis not present

## 2022-12-13 DIAGNOSIS — Z720 Tobacco use: Secondary | ICD-10-CM

## 2022-12-13 DIAGNOSIS — K581 Irritable bowel syndrome with constipation: Secondary | ICD-10-CM

## 2022-12-13 DIAGNOSIS — G43709 Chronic migraine without aura, not intractable, without status migrainosus: Secondary | ICD-10-CM

## 2022-12-13 MED ORDER — RIZATRIPTAN BENZOATE 10 MG PO TABS: ORAL_TABLET | ORAL | 11 refills | Status: DC

## 2022-12-13 MED ORDER — NURTEC 75 MG PO TBDP: 75.0000 mg | ORAL_TABLET | ORAL | 11 refills | Status: DC

## 2022-12-13 NOTE — Progress Notes (Signed)
Pt states she has been having a lot of HA'S. Notes getting off track with Emgality.   History:  Anne Mcdonald is a 62 y.o. [id number] who presents to clinic today for annual headache eval.  She has been without emgality for the last 3 months or more as she didn't automatically get her refills as expected.  When she realized this, she thought since her headaches had been doing so well, she'd just stay off it.  Then the headaches increased.  She hates using injections and would prefer an alternative. She has had no changes in her medical history.  She is getting ready to go on vacation in [place], [place]. She still has no grandchildren on the horizon.    Number of days in the last 4 weeks with:  Severe headache: 5 Moderate headache: 15 Mild headache: 5  No headache: 3   Past Medical History:  Diagnosis Date   Fibroid uterus    PUD (peptic ulcer disease)     Social History   Socioeconomic History   Marital status: Married    Spouse name: Not on file   Number of children: Not on file   Years of education: Not on file   Highest education level: Not on file  Occupational History   Not on file  Tobacco Use   Smoking status: Every Day    Current packs/day: 1.00    Types: Cigarettes    Passive exposure: Never   Smokeless tobacco: Never   Tobacco comments:    1 pack a day   Vaping Use   Vaping status: Never Used  Substance and Sexual Activity   Alcohol use: No   Drug use: No   Sexual activity: Not Currently    Birth control/protection: None, Surgical  Other Topics Concern   Not on file  Social History Narrative   Not on file   Social Determinants of Health   Financial Resource Strain: Not on file  Food Insecurity: Not on file  Transportation Needs: Not on file  Physical Activity: Not on file  Stress: Not on file  Social Connections: Unknown (08/03/2021)   Received from [facility], [facility]   Social Network    Social Network: Not on file  Intimate Partner  Violence: Unknown (06/26/2021)   Received from [employer], [facility]   HITS    Physically Hurt: Not on file    Insult or Talk Down To: Not on file    Threaten Physical Harm: Not on file    Scream or Curse: Not on file    Family History  Adopted: Yes  Family history unknown: Yes    Allergies  Allergen Reactions   Other Rash   Trulance [Plecanatide] Other (See Comments)    Abdominal pain    Current Outpatient Medications on File Prior to Visit  Medication Sig Dispense Refill   Ascorbic Acid (VITAMIN C) 1000 MG tablet Take by mouth.     CALCIUM-MAGNESIUM-ZINC PO Take by mouth.     Cholecalciferol (VITAMIN D3) 5000 units CAPS Take by mouth.     clobetasol ointment (TEMOVATE) 0.05 % Apply to palms of hands QD prn. 45 g 0   cyclobenzaprine (FLEXERIL) 10 MG tablet Take 1 tablet (10 mg total) by mouth 3 (three) times daily as needed for muscle spasms. 30 tablet 0   dicyclomine (BENTYL) 20 MG tablet Take 20 mg by mouth 3 (three) times daily as needed.  1   HYDROcodone-acetaminophen (NORCO/VICODIN) 5-325 MG tablet Take 1 tablet  by mouth every 6 (six) hours as needed for moderate pain. 15 tablet 0   ketoconazole (NIZORAL) 2 % shampoo Apply 1 application topically 2 (two) times a week. 120 mL 6   LamoTRIgine 200 MG TB24 24 hour tablet Take 1 tablet by mouth daily.     LINZESS 290 MCG CAPS capsule TAKE 1 CAPSULE BY MOUTH EVERY DAY BEFORE BREAKFAST 90 capsule 0   Multiple Vitamin (THERA) TABS Take by mouth.     nebivolol (BYSTOLIC) 2.5 MG tablet Take 1 tablet (2.5 mg total) by mouth daily. Needs appt 90 tablet 3   predniSONE (DELTASONE) 20 MG tablet Take 2 tablets (40 mg total) by mouth daily with breakfast. 10 tablet 0   rizatriptan (MAXALT) 10 MG tablet TAKE 1 TABLET BY MOUTH AS NEEDED FOR MIGRAINE. MAY REPEAT IN 2 HOURS IF NEEDED 12 tablet 11   rOPINIRole (REQUIP) 1 MG tablet TAKE 1 TABLET BY MOUTH AT BEDTIME. MAY TAKE 1 EXTRA TAB PER DAY IF NEEDED 180 tablet 2   traZODone (DESYREL)  150 MG tablet TAKE 1 TABLET BY MOUTH EVERYDAY AT BEDTIME     Galcanezumab-gnlm (EMGALITY) 120 MG/ML SOAJ Inject 1 mL into the skin every 30 (thirty) days. (Patient not taking: Reported on 12/13/2022) 1.12 mL 11   No current facility-administered medications on file prior to visit.     Review of Systems:  All pertinent positive/negative included in HPI, all other review of systems are negative   Objective:  Physical Exam BP 109/72   Pulse 60   Wt 142 lb (64.4 kg)   LMP 07/17/2016   BMI 24.37 kg/m  CONSTITUTIONAL: Well-developed, well-nourished female in no acute distress.  EYES: EOM intact ENT: Normocephalic CARDIOVASCULAR: Regular rate  RESPIRATORY: Normal rate.  MUSCULOSKELETAL: Normal ROM SKIN: Warm, dry without erythema  NEUROLOGICAL: Alert, oriented, CN II-XII grossly intact, Appropriate balance  PSYCH: Normal behavior, mood   Assessment & Plan:  Assessment: 1. Migraine without aura and without status migrainosus, not intractable   2. Chronic migraine without aura without status migrainosus, not intractable      Plan: Will use Nurtec every other day in place of emgality for migraine prevention.  If this is unsuccessful, she can always restart Emgality.  Okay to use rizatriptan for breakthrough migriane as needed - so long as patient continues to have good bp and no cardiovascular issues.   Follow-up in 12 months or sooner PRN  33 minutes spent face to face with provider this encounter.  Bertram Denver, PA-C 12/13/2022 11:26 AM

## 2023-01-19 ENCOUNTER — Ambulatory Visit: Payer: Managed Care, Other (non HMO)

## 2023-01-19 ENCOUNTER — Ambulatory Visit
Admission: EM | Admit: 2023-01-19 | Discharge: 2023-01-19 | Disposition: A | Discharge status: Home / Self Care | Payer: Managed Care, Other (non HMO) | Attending: Family Medicine | Admitting: Family Medicine

## 2023-01-19 ENCOUNTER — Encounter: Payer: Self-pay | Admitting: Emergency Medicine

## 2023-01-19 DIAGNOSIS — R0782 Intercostal pain: Secondary | ICD-10-CM

## 2023-01-19 DIAGNOSIS — R0781 Pleurodynia: Secondary | ICD-10-CM | POA: Diagnosis not present

## 2023-01-19 MED ORDER — OXYCODONE-ACETAMINOPHEN 5-325 MG PO TABS
1.0000 | ORAL_TABLET | Freq: Four times a day (QID) | ORAL | 0 refills | Status: DC | PRN
Start: 2023-01-19 — End: 2023-04-10

## 2023-01-19 MED ORDER — NAPROXEN SODIUM 550 MG PO TABS: 550.0000 mg | ORAL_TABLET | Freq: Two times a day (BID) | ORAL | 0 refills | Status: DC

## 2023-01-19 NOTE — Discharge Instructions (Signed)
Take the Anaprox 2 times a day.  Do not take Motrin or Aleve while on Anaprox.  Take it with food Take the oxycodone as needed for severe pain.  Do not drive on oxycodone.  Watch for drowsiness or constipation See your doctor if not improving over the next couple of weeks

## 2023-01-19 NOTE — ED Triage Notes (Signed)
Patient c/o left sided rib pain x 1 week.  Patient states that her brother gave her a really big bear hug and she heard her ribs crack while he was hugging her.  Having some difficulty breathing.  Taken Ibuprofen for pain.
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
## 2023-02-09 ENCOUNTER — Other Ambulatory Visit: Payer: Self-pay | Admitting: Family Medicine

## 2023-02-09 DIAGNOSIS — E781 Pure hyperglyceridemia: Secondary | ICD-10-CM

## 2023-02-09 DIAGNOSIS — I471 Supraventricular tachycardia, unspecified: Secondary | ICD-10-CM

## 2023-02-09 DIAGNOSIS — K581 Irritable bowel syndrome with constipation: Secondary | ICD-10-CM

## 2023-02-09 DIAGNOSIS — G2581 Restless legs syndrome: Secondary | ICD-10-CM

## 2023-03-13 ENCOUNTER — Other Ambulatory Visit: Payer: Self-pay | Admitting: Family Medicine

## 2023-03-13 DIAGNOSIS — Z720 Tobacco use: Secondary | ICD-10-CM

## 2023-04-10 ENCOUNTER — Encounter: Payer: Self-pay | Admitting: Family Medicine

## 2023-04-10 ENCOUNTER — Ambulatory Visit (INDEPENDENT_AMBULATORY_CARE_PROVIDER_SITE_OTHER): Payer: Managed Care, Other (non HMO) | Admitting: Family Medicine

## 2023-04-10 VITALS — BP 120/61 | HR 68 | Ht 64.0 in | Wt 144.0 lb

## 2023-04-10 DIAGNOSIS — R5383 Other fatigue: Secondary | ICD-10-CM | POA: Diagnosis not present

## 2023-04-10 DIAGNOSIS — L219 Seborrheic dermatitis, unspecified: Secondary | ICD-10-CM

## 2023-04-10 DIAGNOSIS — R051 Acute cough: Secondary | ICD-10-CM | POA: Diagnosis not present

## 2023-04-10 DIAGNOSIS — Z0000 Encounter for general adult medical examination without abnormal findings: Secondary | ICD-10-CM | POA: Diagnosis not present

## 2023-04-10 MED ORDER — HYDROCODONE BIT-HOMATROP MBR 5-1.5 MG/5ML PO SOLN
5.0000 mL | Freq: Every evening | ORAL | 0 refills | Status: DC | PRN
Start: 2023-04-10 — End: 2023-10-28

## 2023-04-10 MED ORDER — KETOCONAZOLE 2 % EX SHAM: 1.0000 | MEDICATED_SHAMPOO | CUTANEOUS | 6 refills | Status: AC

## 2023-04-10 NOTE — Progress Notes (Signed)
Complete physical exam  Patient: Anne Mcdonald   DOB: 1960/05/09   63 y.o. Female  MRN: [id number]  Subjective:    Chief Complaint  Patient presents with   Annual Exam    Anne Mcdonald is a 63 y.o. female who presents today for a complete physical exam. She reports consuming a general diet. The patient does not participate in regular exercise at present. She generally feels fairly well. She reports sleeping fairly well. She does have additional problems to discuss today.   Battling tori infection for a little over 3 weeks.  She has sought care twice the first time she had a chest x-ray which was negative.  Second time went back because she was feeling worse and wanting to sleep all the time and they put her on antibiotics and what sounds like Tessalon Perles.  She said finally Tuesday of this week she started to feel little bit better but she still coughing a lot and it is waking her up at night.  No fevers or chills or sweats.  Would like a refill on her ketoconazole shampoo.  Most recent fall risk assessment:    05/10/2022   10:40 AM  Fall Risk   Falls in the past year? 0  Number falls in past yr: 0  Injury with Fall? 0  Risk for fall due to : No Fall Risks  Follow up Falls evaluation completed     Most recent depression screenings:    04/10/2023   11:38 AM 05/10/2022   10:40 AM  PHQ 2/9 Scores  PHQ - 2 Score 0 0        Patient Care Team: Agapito Games, MD as PCP - Fleet Contras, MD as Referring Physician (Obstetrics and Gynecology)   Outpatient Medications Prior to Visit  Medication Sig   Ascorbic Acid (VITAMIN C) 1000 MG tablet Take by mouth.   CALCIUM-MAGNESIUM-ZINC PO Take by mouth.   clobetasol ointment (TEMOVATE) 0.05 % Apply to palms of hands QD prn.   cyclobenzaprine (FLEXERIL) 10 MG tablet Take 1 tablet (10 mg total) by mouth 3 (three) times daily as needed for muscle spasms.   LamoTRIgine 200 MG TB24 24 hour tablet Take 1  tablet by mouth daily.   linaclotide (LINZESS) 290 MCG CAPS capsule TAKE 1 CAPSULE BY MOUTH EVERY DAY BEFORE BREAKFAST   Multiple Vitamin (THERA) TABS Take by mouth.   nebivolol (BYSTOLIC) 2.5 MG tablet TAKE 1 TABLET (2.5 MG TOTAL) BY MOUTH DAILY. NEEDS APPT   Rimegepant Sulfate (NURTEC) 75 MG TBDP Take 1 tablet (75 mg total) by mouth every other day.   rizatriptan (MAXALT) 10 MG tablet TAKE 1 TABLET BY MOUTH AS NEEDED FOR MIGRAINE. MAY REPEAT IN 2 HOURS IF NEEDED   rOPINIRole (REQUIP) 1 MG tablet TAKE 1 TABLET BY MOUTH AT BEDTIME. MAY TAKE 1 EXTRA TAB PER DAY IF NEEDED   traZODone (DESYREL) 150 MG tablet TAKE 1 TABLET BY MOUTH EVERYDAY AT BEDTIME   varenicline (CHANTIX) 1 MG tablet Take 1 tablet (1 mg total) by mouth 2 (two) times daily.   [DISCONTINUED] ketoconazole (NIZORAL) 2 % shampoo Apply 1 application topically 2 (two) times a week.   [DISCONTINUED] Cholecalciferol (VITAMIN D3) 5000 units CAPS Take by mouth.   [DISCONTINUED] dicyclomine (BENTYL) 20 MG tablet Take 20 mg by mouth 3 (three) times daily as needed.   [DISCONTINUED] naproxen sodium (ANAPROX DS) 550 MG tablet Take 1 tablet (550 mg total) by mouth 2 (two) times daily with  a meal.   [DISCONTINUED] oxyCODONE-acetaminophen (PERCOCET/ROXICET) 5-325 MG tablet Take 1 tablet by mouth every 6 (six) hours as needed for severe pain (pain score 7-10).   [DISCONTINUED] predniSONE (DELTASONE) 20 MG tablet Take 2 tablets (40 mg total) by mouth daily with breakfast.   No facility-administered medications prior to visit.    ROS        Objective:     BP 120/61   Pulse 68   Ht 5\' 4"  (1.626 m)   Wt 144 lb (65.3 kg)   LMP 07/17/2016   SpO2 100%   BMI 24.72 kg/m    Physical Exam Constitutional:      Appearance: Normal appearance.  HENT:     Head: Normocephalic and atraumatic.     Right Ear: Tympanic membrane, ear canal and external ear normal.     Left Ear: Tympanic membrane, ear canal and external ear normal.     Nose: Nose  normal.     Mouth/Throat:     Pharynx: Oropharynx is clear.  Eyes:     Extraocular Movements: Extraocular movements intact.     Conjunctiva/sclera: Conjunctivae normal.     Pupils: Pupils are equal, round, and reactive to light.  Neck:     Thyroid: No thyromegaly.  Cardiovascular:     Rate and Rhythm: Normal rate and regular rhythm.  Pulmonary:     Effort: Pulmonary effort is normal.     Breath sounds: Normal breath sounds.  Abdominal:     General: Bowel sounds are normal.     Palpations: Abdomen is soft.     Tenderness: There is no abdominal tenderness.  Musculoskeletal:        General: No swelling.     Cervical back: Neck supple.  Skin:    General: Skin is warm and dry.  Neurological:     Mental Status: She is oriented to person, place, and time.  Psychiatric:        Mood and Affect: Mood normal.        Behavior: Behavior normal.      No results found for any visits on 04/10/23.     Assessment & Plan:    Routine Health Maintenance and Physical Exam  Immunization History  Administered Date(s) Administered   Tdap 03/04/2013    Health Maintenance  Topic Date Due   COVID-19 Vaccine (1 - 2024-25 season) Never done   Pneumococcal Vaccine 61-56 Years old (1 of 2 - PCV) 04/10/2023 (Originally 11/04/1966)   INFLUENZA VACCINE  06/23/2023 (Originally 10/24/2022)   Zoster Vaccines- Shingrix (1 of 2) 07/09/2023 (Originally 11/04/2009)   DTaP/Tdap/Td (2 - Td or Tdap) 04/09/2024 (Originally 03/05/2023)   Lung Cancer Screening  07/09/2023   MAMMOGRAM  11/03/2024   Colonoscopy  07/23/2025   Hepatitis C Screening  Completed   HIV Screening  Completed   HPV VACCINES  Aged Out    Discussed health benefits of physical activity, and encouraged her to engage in regular exercise appropriate for her age and condition.  Problem List Items Addressed This Visit       Musculoskeletal and Integument   Seborrheic dermatitis   Relevant Medications   ketoconazole (NIZORAL) 2 % shampoo    Other Visit Diagnoses       Wellness examination    -  Primary   Relevant Orders   CMP14+EGFR   Lipid panel   CBC   TSH     Other fatigue       Relevant Orders   CMP14+EGFR  Lipid panel   CBC   TSH     Acute cough       Relevant Medications   HYDROcodone bit-homatropine (HYCODAN) 5-1.5 MG/5ML syrup       Keep up a regular exercise program and make sure you are eating a healthy diet Try to eat 4 servings of dairy a day, or if you are lactose intolerant take a calcium with vitamin D daily.  Your vaccines are up to date.   Cough -Given prescription for hydrocodone cough syrup.  If not feeling better after completing her amoxicillin then please let us know and consider testing for other causes such as pertussis etc.  No follow-ups on file.     Nani Gasser, MD

## 2023-04-11 LAB — CMP14+EGFR
ALT: 17 [IU]/L (ref 0–32)
AST: 16 [IU]/L (ref 0–40)
Albumin: 4.3 g/dL (ref 3.9–4.9)
Alkaline Phosphatase: 84 [IU]/L (ref 44–121)
BUN/Creatinine Ratio: 18 (ref 12–28)
BUN: 14 mg/dL (ref 8–27)
Bilirubin Total: 0.3 mg/dL (ref 0.0–1.2)
CO2: 23 mmol/L (ref 20–29)
Calcium: 9 mg/dL (ref 8.7–10.3)
Chloride: 104 mmol/L (ref 96–106)
Creatinine, Ser: 0.76 mg/dL (ref 0.57–1.00)
Globulin, Total: 1.7 g/dL (ref 1.5–4.5)
Glucose: 69 mg/dL — ABNORMAL LOW (ref 70–99)
Potassium: 4.3 mmol/L (ref 3.5–5.2)
Sodium: 141 mmol/L (ref 134–144)
Total Protein: 6 g/dL (ref 6.0–8.5)
eGFR: 88 mL/min/{1.73_m2} (ref >59)

## 2023-04-11 LAB — LIPID PANEL
Chol/HDL Ratio: 3.2 {ratio} (ref 0.0–4.4)
Cholesterol, Total: 168 mg/dL (ref 100–199)
HDL: 52 mg/dL (ref >39)
LDL Chol Calc (NIH): 93 mg/dL (ref 0–99)
Triglycerides: 128 mg/dL (ref 0–149)
VLDL Cholesterol Cal: 23 mg/dL (ref 5–40)

## 2023-04-11 LAB — CBC
Hematocrit: 41.3 % (ref 34.0–46.6)
Hemoglobin: 13.5 g/dL (ref 11.1–15.9)
MCH: 31.9 pg (ref 26.6–33.0)
MCHC: 32.7 g/dL (ref 31.5–35.7)
MCV: 98 fL — ABNORMAL HIGH (ref 79–97)
Platelets: 350 10*3/uL (ref 150–450)
RBC: 4.23 x10E6/uL (ref 3.77–5.28)
RDW: 12.8 % (ref 11.7–15.4)
WBC: 9.6 10*3/uL (ref 3.4–10.8)

## 2023-04-11 LAB — TSH: TSH: 1.43 u[IU]/mL (ref 0.450–4.500)

## 2023-04-11 NOTE — Progress Notes (Signed)
Madalyn, overall metabolic panel and blood count look good.  No concerning findings.  Cholesterol looks great.  Thyroid looks perfect.

## 2023-04-11 NOTE — Progress Notes (Signed)
Hi Paulett, sorry for the misspelling on your name below.  I was using my dictation software and that is how it decided to spell your name.  I did not catch it until after I had already sent it to you.

## 2023-05-01 ENCOUNTER — Other Ambulatory Visit: Payer: Self-pay | Admitting: *Deleted

## 2023-05-01 MED ORDER — RIZATRIPTAN BENZOATE 10 MG PO TABS: ORAL_TABLET | ORAL | 6 refills | Status: DC

## 2023-08-09 IMAGING — DX DG KNEE 1-2V*R*
2 series · 2 of 2 positions shown · non-contrast
Comparison: Left knee radiographs 12/09/2019

CLINICAL DATA: Medial left knee pain.  Increasing.

EXAM:
RIGHT KNEE - 1-2 VIEW; LEFT KNEE - COMPLETE 4+ VIEW

[knee ap bilat standing]
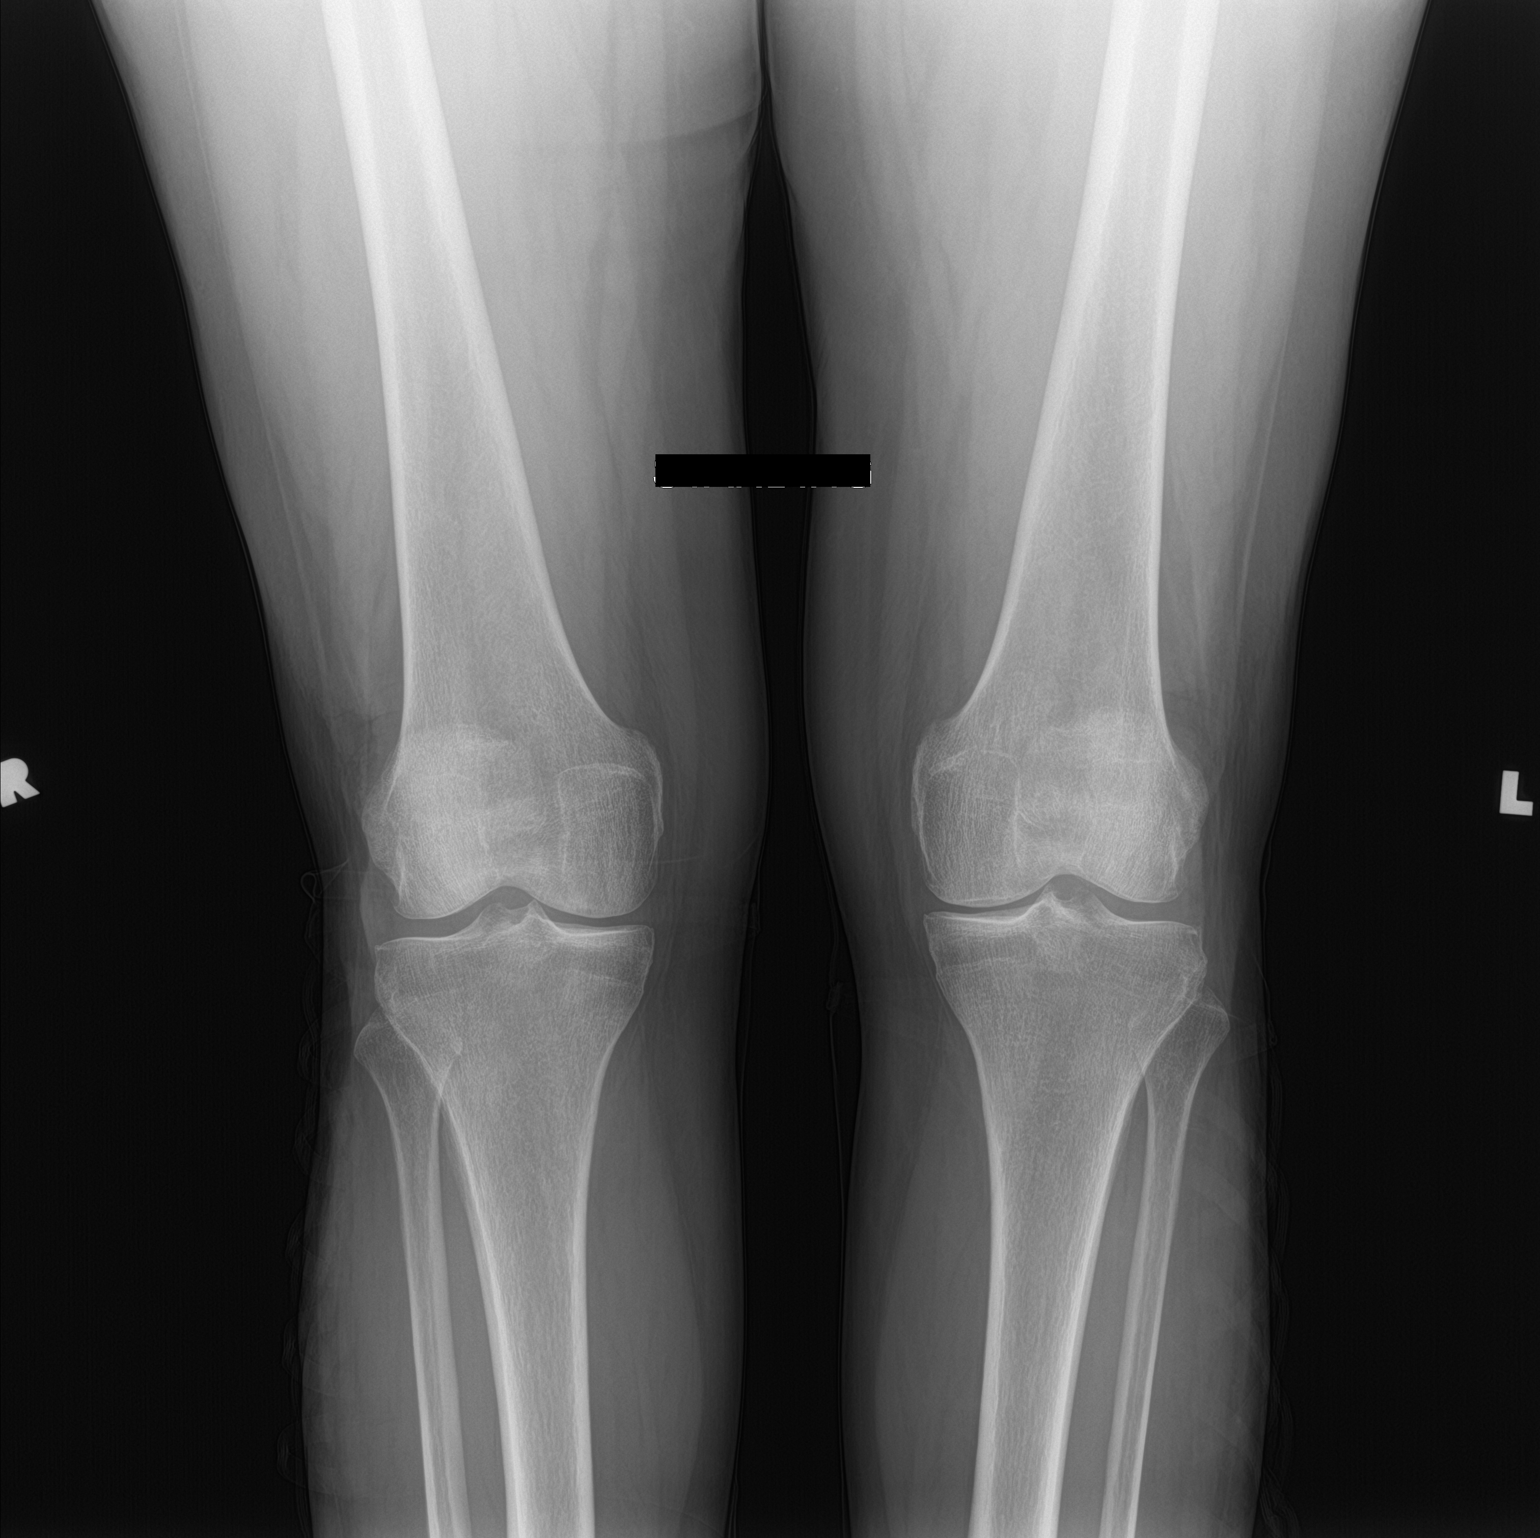

[knee tunnel]
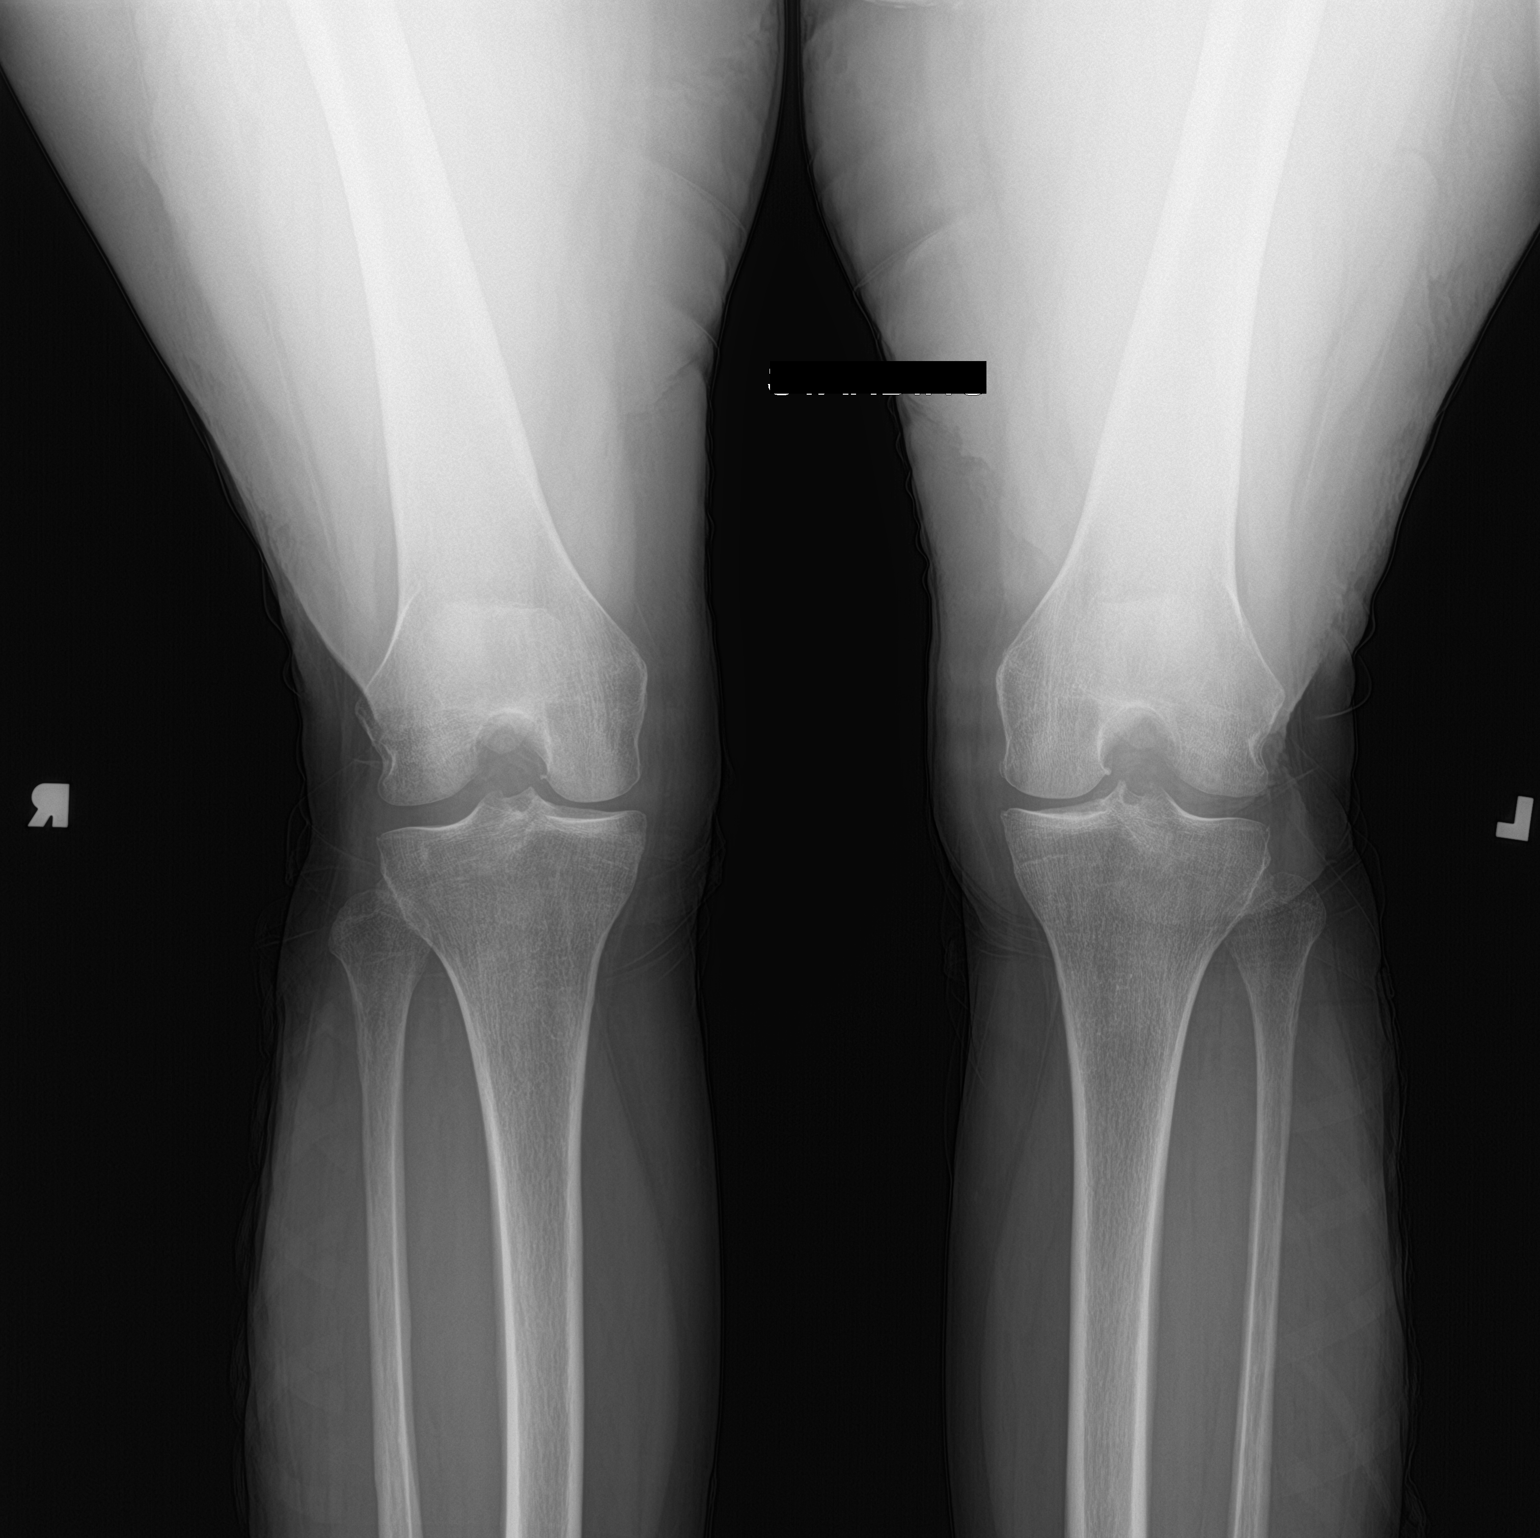

[2 of 2 positions shown; findings below may reference images not displayed]

FINDINGS: Left knee:

Mild medial compartment joint space narrowing and peripheral
osteophytosis. Minimal superior patellar degenerative osteophytosis.
No joint effusion. No acute fracture or dislocation.

Frontal view of the right knee demonstrates mild medial compartment
joint space narrowing.
IMPRESSION: Mild left knee medial compartment osteoarthritis, similar to prior.

## 2023-09-11 LAB — HM COLONOSCOPY

## 2023-10-27 ENCOUNTER — Ambulatory Visit: Payer: Self-pay
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
## 2023-10-28 ENCOUNTER — Ambulatory Visit: Admitting: Family Medicine

## 2023-10-28 ENCOUNTER — Ambulatory Visit

## 2023-10-28 ENCOUNTER — Encounter: Payer: Self-pay | Admitting: Family Medicine

## 2023-10-28 VITALS — BP 124/56 | HR 54 | Ht 64.0 in | Wt 144.1 lb

## 2023-10-28 DIAGNOSIS — R0781 Pleurodynia: Secondary | ICD-10-CM

## 2023-10-28 DIAGNOSIS — M546 Pain in thoracic spine: Secondary | ICD-10-CM

## 2023-10-28 DIAGNOSIS — N644 Mastodynia: Secondary | ICD-10-CM | POA: Diagnosis not present

## 2023-10-28 DIAGNOSIS — G43709 Chronic migraine without aura, not intractable, without status migrainosus: Secondary | ICD-10-CM

## 2023-10-28 DIAGNOSIS — M545 Low back pain, unspecified: Secondary | ICD-10-CM

## 2023-10-28 DIAGNOSIS — F331 Major depressive disorder, recurrent, moderate: Secondary | ICD-10-CM

## 2023-10-28 DIAGNOSIS — M51369 Other intervertebral disc degeneration, lumbar region without mention of lumbar back pain or lower extremity pain: Secondary | ICD-10-CM | POA: Insufficient documentation

## 2023-10-28 DIAGNOSIS — G8929 Other chronic pain: Secondary | ICD-10-CM

## 2023-10-28 DIAGNOSIS — M5136 Other intervertebral disc degeneration, lumbar region with discogenic back pain only: Secondary | ICD-10-CM

## 2023-10-28 MED ORDER — RIZATRIPTAN BENZOATE 10 MG PO TBDP
10.0000 mg | ORAL_TABLET | ORAL | 6 refills | Status: AC | PRN
Start: 2023-10-28 — End: ?

## 2023-10-28 MED ORDER — CYCLOBENZAPRINE HCL 10 MG PO TABS: 10.0000 mg | ORAL_TABLET | Freq: Three times a day (TID) | ORAL | 0 refills | Status: DC | PRN

## 2023-10-28 MED ORDER — HYDROCODONE-ACETAMINOPHEN 5-325 MG PO TABS: 1.0000 | ORAL_TABLET | Freq: Four times a day (QID) | ORAL | 0 refills | Status: AC | PRN

## 2023-10-28 MED ORDER — PREDNISONE 20 MG PO TABS: 40.0000 mg | ORAL_TABLET | Freq: Every day | ORAL | 0 refills | Status: DC

## 2023-10-28 NOTE — Progress Notes (Signed)
 Established Patient Office Visit  Subjective  Patient ID: Anne Mcdonald, female    DOB: [birth year]  Age: 63 y.o. MRN: [id number]  Chief Complaint  Patient presents with   Back Pain   Chest Pain    HPI   She had bilateral rib films done in October 2020 for as she was having some pain and discomfort especially after being hugged or squeezed.  And the x-rays looked okay.her ribs are still painful.  She now has left anterior rib pain just below the breast area that is what seems to be persistently tender to touch and painful.  She says that sometimes it even makes her left breast hurts she thinks she is due for a mammogram.  Lumbar MRI 12/22/19: IMPRESSION: Severe disc space narrowing at L5-S1 with mild vacuum phenomenon. 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 Other levels appear unremarkable. No fracture or spondylolisthesis.  She is also still experiencing a lot of low back pain.  It has been worse this summer since she has been working in the yard.  She actually had an MRI on her lumbar spine back in 2021 with Dr. Curtis our sports med doc which showed some pretty severe degenerative disc disease back there.  She says she has tended to respond well to prednisone , muscle relaxer and a short supply of hydrocodone  when she has a flare.  She has also had some pain in her mid back that shoots around to her lateral ribs especially with any type of twisting motion it is coming from below that bra strap area but above the waistline.  And started more recently with increased yard work over the summer.  Migraines - pharmacy doesn't have tabs, only melts. Has been using daughters medication.   Starting to feel a lot of mental anguish in regards to the winter coming she really struggles emotionally in the wintertime for the last several years she would go down to [place]  for several weeks to help take care of her mom but her mom is now passed.  And so she is already starting to  feel a little anxious she has talked with her psychiatrist about it and they have given her an as needed anxiety medication to try.     ROS    Objective:     BP (!) 124/56   Pulse (!) 54   Ht 5' 4 (1.626 m)   Wt 144 lb 1.6 oz (65.4 kg)   LMP 07/17/2016   SpO2 100%   BMI 24.73 kg/m    Physical Exam Vitals and nursing note reviewed.  Constitutional:      Appearance: Normal appearance.  HENT:     Head: Normocephalic and atraumatic.  Eyes:     Conjunctiva/sclera: Conjunctivae normal.  Cardiovascular:     Rate and Rhythm: Normal rate and regular rhythm.  Pulmonary:     Effort: Pulmonary effort is normal.     Breath sounds: Normal breath sounds.  Skin:    General: Skin is warm and dry.  Neurological:     Mental Status: She is alert.  Psychiatric:        Mood and Affect: Mood normal.      No results found for any visits on 10/28/23.    The 10-year ASCVD risk score (Arnett DK, et al., 2019) is: 7.6%    Assessment & Plan:   Problem List Items Addressed This Visit       Cardiovascular and Mediastinum   Chronic migraine without aura without  status migrainosus, not intractable - Primary   Will switch tabs to the melts.  Hopefully the pharmacy will have these in stock.      Relevant Medications   rizatriptan  (MAXALT -MLT) 10 MG disintegrating tablet   cyclobenzaprine  (FLEXERIL ) 10 MG tablet   predniSONE  (DELTASONE ) 20 MG tablet   HYDROcodone -acetaminophen  (NORCO/VICODIN) 5-325 MG tablet   Other Relevant Orders   DG Lumbar Spine Complete   DG Thoracic Spine W/Swimmers     Musculoskeletal and Integument   Lumbar degenerative disc disease   Since she is having significant pain again we will treat acutely with prednisone , muscle relaxer and a short supply of hydrocodone  to try to get her out of her current flare she is pretty much finished up her yard work so hopefully will continue to improve.  But this has been an ongoing issue for years.  Will get updated  x-ray films.  Last MRI was in 2021.  May need to get updated MRI as well but will start with plain film.  She is also now having some thoracic pain so organ to get a plain film of the thoracic spine as well.  After finishes the prednisone  okay to just use anti-inflammatories as needed.        Other   Moderate episode of recurrent major depressive disorder [facility])   Currently follows with psychiatry.  They are going to do a trial of a as needed anxiety medication if she moves into the winter months.  She is gena try to see if that she might be able to travel to [place]  this winter and maybe give her something to look forward to.      Other Visit Diagnoses       Chronic midline low back pain without sciatica       Relevant Medications   cyclobenzaprine  (FLEXERIL ) 10 MG tablet   predniSONE  (DELTASONE ) 20 MG tablet   HYDROcodone -acetaminophen  (NORCO/VICODIN) 5-325 MG tablet   Other Relevant Orders   DG Lumbar Spine Complete   DG Thoracic Spine W/Swimmers     Rib pain on left side       Relevant Orders   DG Lumbar Spine Complete   DG Thoracic Spine W/Swimmers     Acute midline thoracic back pain       Relevant Medications   cyclobenzaprine  (FLEXERIL ) 10 MG tablet   predniSONE  (DELTASONE ) 20 MG tablet   HYDROcodone -acetaminophen  (NORCO/VICODIN) 5-325 MG tablet   Other Relevant Orders   DG Lumbar Spine Complete   DG Thoracic Spine W/Swimmers     Breast pain, left       Relevant Orders   MM 3D DIAGNOSTIC MAMMOGRAM BILATERAL BREAST   US  LIMITED ULTRASOUND INCLUDING AXILLA LEFT BREAST       Left breast pain-it could be that is just radiating from that rib area but she has had a prior cyst that they have been following in that left breast for years so would like to get diagnostic imaging for further workup and to be thorough.  left rib pain -  we discussed a couple of options.  If we do end up getting an MRI on the spine then we might be able to see the the rib area on the film at the  same time without having to order a separate scan.  No follow-ups on file.   I spent 40 minutes on the day of the encounter to include pre-visit record review, face-to-face time with the patient and post visit ordering of test.  Dorothyann Byars, MD

## 2023-10-28 NOTE — Assessment & Plan Note (Signed)
 Will switch tabs to the melts.  Hopefully the pharmacy will have these in stock.

## 2023-10-28 NOTE — Assessment & Plan Note (Signed)
 Currently follows with psychiatry.  They are going to do a trial of a as needed anxiety medication if she moves into the winter months.  She is gena try to see if that she might be able to travel to [place]  this winter and maybe give her something to look forward to.

## 2023-10-28 NOTE — Telephone Encounter (Signed)
 Patient seen  in office today with Dr. Greer Leak

## 2023-10-28 NOTE — Assessment & Plan Note (Addendum)
 Since she is having significant pain again we will treat acutely with prednisone , muscle relaxer and a short supply of hydrocodone  to try to get her out of her current flare she is pretty much finished up her yard work so hopefully will continue to improve.  But this has been an ongoing issue for years.  Will get updated x-ray films.  Last MRI was in 2021.  May need to get updated MRI as well but will start with plain film.  She is also now having some thoracic pain so organ to get a plain film of the thoracic spine as well.  After finishes the prednisone  okay to just use anti-inflammatories as needed.

## 2023-10-31 ENCOUNTER — Ambulatory Visit: Admitting: Sports Medicine

## 2023-10-31 ENCOUNTER — Encounter: Payer: Self-pay | Admitting: Sports Medicine

## 2023-10-31 DIAGNOSIS — M79671 Pain in right foot: Secondary | ICD-10-CM | POA: Diagnosis not present

## 2023-10-31 NOTE — Assessment & Plan Note (Signed)
 Pleasant 63 year old female with a history of pes cavus, she did some work in the yard, then had some pain medial foot localized over the navicular. She then went on steroids and pain medicine for a different reason. Her foot pain has now resolved, likely from the above. On exam she has pes cavus, she has no areas of tenderness to palpation, only minimal tarsometatarsal bossing per On further questioning she has had custom orthotics made by another provider, was not extremely helpful, as she is not having any pain I really have nothing to suggest other than getting an insert that is sufficiently supportive of her pes cavus.

## 2023-10-31 NOTE — Progress Notes (Signed)
    Procedures performed today:    None.  Independent interpretation of notes and tests performed by another provider:   None.  Brief History, Exam, Impression, and Recommendations:    Right foot pain Pleasant 63 year old female with a history of pes cavus, she did some work in the yard, then had some pain medial foot localized over the navicular. She then went on steroids and pain medicine for a different reason. Her foot pain has now resolved, likely from the above. On exam she has pes cavus, she has no areas of tenderness to palpation, only minimal tarsometatarsal bossing per On further questioning she has had custom orthotics made by another provider, was not extremely helpful, as she is not having any pain I really have nothing to suggest other than getting an insert that is sufficiently supportive of her pes cavus.    ____________________________________________ Debby PARAS. Curtis, M.D., ABFM., CAQSM., AME. Primary Care and Sports Medicine [facility]  Adjunct Professor of [facility] [employer] [facility] Medicine  [employer]  Restaurant manager, fast food

## 2023-11-05 ENCOUNTER — Encounter: Payer: Self-pay | Admitting: Family Medicine

## 2023-11-05 ENCOUNTER — Ambulatory Visit: Payer: Self-pay | Admitting: Family Medicine

## 2023-11-05 DIAGNOSIS — G8929 Other chronic pain: Secondary | ICD-10-CM

## 2023-11-05 DIAGNOSIS — R0781 Pleurodynia: Secondary | ICD-10-CM

## 2023-11-05 DIAGNOSIS — M546 Pain in thoracic spine: Secondary | ICD-10-CM

## 2023-11-05 NOTE — Progress Notes (Signed)
 Hi Devanshi, we finally got your results back.  The x-ray of your upper back shows pretty good alignment.  No sign of fracture which is good.  See some spondylosis which is basically age-related wear-and-tear of the spine particularly at the discs in between the vertebrae.  This point I would recommend formal physical therapy.

## 2023-11-05 NOTE — Progress Notes (Signed)
 X-ray  of the lumbar spine shows some disc space narrowing at L4-5 and L5-S1 this is towards that lower lumbar area.  There are some arthritis at the hinge points of the spine as well.  I did put a note on the x-ray for your mid back as well.  I think neck step would be formal physical therapy I think that would help you the most.

## 2023-11-05 NOTE — Telephone Encounter (Signed)
 Please call and see if can ready xrays

## 2023-11-05 NOTE — Telephone Encounter (Signed)
 I called and requested a stat read.

## 2023-11-12 NOTE — Telephone Encounter (Signed)
 Orders Placed This Encounter  Procedures  . Ambulatory referral to Physical Therapy    Referral Priority:   Routine    Referral Type:   Physical Medicine    Referral Reason:   Specialty Services Required    Requested Specialty:   Physical Therapy    Number of Visits Requested:   1

## 2023-11-14 ENCOUNTER — Other Ambulatory Visit: Payer: Self-pay | Admitting: Family Medicine

## 2023-11-14 DIAGNOSIS — K581 Irritable bowel syndrome with constipation: Secondary | ICD-10-CM

## 2023-11-14 NOTE — Telephone Encounter (Signed)
 Requesting rx rf of Linzess  Last written 02/10/2023 Last OV 10/28/2023 Upcoming appt = none

## 2023-11-19 ENCOUNTER — Other Ambulatory Visit: Payer: Self-pay

## 2023-11-19 ENCOUNTER — Ambulatory Visit: Attending: Family Medicine

## 2023-11-19 DIAGNOSIS — M5459 Other low back pain: Secondary | ICD-10-CM | POA: Diagnosis present

## 2023-11-19 DIAGNOSIS — G8929 Other chronic pain: Secondary | ICD-10-CM | POA: Insufficient documentation

## 2023-11-19 DIAGNOSIS — M6281 Muscle weakness (generalized): Secondary | ICD-10-CM | POA: Diagnosis present

## 2023-11-19 DIAGNOSIS — R0781 Pleurodynia: Secondary | ICD-10-CM | POA: Insufficient documentation

## 2023-11-19 DIAGNOSIS — M546 Pain in thoracic spine: Secondary | ICD-10-CM | POA: Diagnosis not present

## 2023-11-19 DIAGNOSIS — M545 Low back pain, unspecified: Secondary | ICD-10-CM | POA: Diagnosis not present

## 2023-11-19 NOTE — Therapy (Signed)
 OUTPATIENT PHYSICAL THERAPY THORACOLUMBAR EVALUATION   Patient Name: Anne Mcdonald MRN: [id number] DOB:1960/04/23,63 y.o., female Today's Date: 11/19/2023   END OF SESSION:  PT End of Session - 11/19/23 1019     Visit Number 1    Number of Visits 17    Date for PT Re-Evaluation 01/17/24    Authorization Type [employer]    PT Start Time 1019    PT Stop Time 1100    PT Time Calculation (min) 41 min    Activity Tolerance Patient tolerated treatment well    Behavior During Therapy WFL for tasks assessed/performed           Past Medical History:  Diagnosis Date   Arthritis 2020   Fibroid uterus    GERD (gastroesophageal reflux disease)    PUD (peptic ulcer disease)    Past Surgical History:  Procedure Laterality Date   ABDOMINAL HYSTERECTOMY  2017   CESAREAN SECTION     CHOLECYSTECTOMY     TOTAL ABDOMINAL HYSTERECTOMY W/ BILATERAL SALPINGOOPHORECTOMY  08/2016   Patient Active Problem List   Diagnosis Date Noted   Right foot pain 10/31/2023   Lumbar degenerative disc disease 10/28/2023   Chest pain 05/12/2022   Left knee injury 06/15/2021   Metatarsalgia, left foot 08/02/2020   Memory impairment 02/14/2020   Hearing loss 12/10/2019   Irritable bowel syndrome with constipation 03/05/2019   RLS (restless legs syndrome) 07/15/2018   Seborrheic dermatitis 07/15/2018   Primary osteoarthritis of both hands 05/02/2017   Right carpal tunnel syndrome 05/02/2017   Dyshidrotic eczema 12/24/2016   Uterovaginal prolapse 08/27/2016   Bipolar 1 disorder (HCC) 08/02/2016   Moderate episode of recurrent major depressive disorder (HCC) 05/07/2016   Uterine fibroid 09/18/2015   Chronic migraine without aura without status migrainosus, not intractable 05/02/2015   Medication overuse headache 05/02/2015   Headache, menstrual migraine, intractable, with status migrainosus 12/09/2014   GERD (gastroesophageal reflux disease) 11/29/2014   Paroxysmal SVT (supraventricular  tachycardia) (HCC) 06/10/2012   CIGARETTE SMOKER 08/24/2009   Migraine without aura 09/07/2008   HYPERTRIGLYCERIDEMIA 07/19/2008   Premenstrual tension syndrome 04/24/2006   HOT FLASHES 04/24/2006   Acute left-sided low back pain with left-sided sciatica 04/24/2006    PCP: Alvan Dorothyann BIRCH, MD   REFERRING PROVIDER: Alvan Dorothyann BIRCH, MD   REFERRING DIAG:  M54.50,G89.29 (ICD-10-CM) - Chronic midline low back pain without sciatica  R07.81 (ICD-10-CM) - Rib pain on left side  M54.6 (ICD-10-CM) - Acute midline thoracic back pain    Rationale for Evaluation and Treatment: Rehabilitation  THERAPY DIAG:  Other low back pain  Muscle weakness (generalized)  ONSET DATE: acute on chronic; recent exacerbation August 2025   SUBJECTIVE:  SUBJECTIVE STATEMENT: Patient reports her back pain started around 20 years when she herniated her disc and thinks it was from working out. Every few years she would hurt it again. Her most recent exacerbation of pain began at the beginning of August. She was doing a lot of yard work. She thinks it was from sitting pulling weeds where she was having to twist and turn a lot. She underwent physical therapy when she initially hurt her back, which was helpful. The pain is now intermittent about the lower back. Sometimes the pain will radiate down the posterior thighs bilaterally. She also reports a meniscus tear in the Lt knee that was treated conservatively, which will occasionally hurt after strenuous activity or twisting activity. No numbness/tingling. No red flag symptoms.   PERTINENT HISTORY:  Hearing loss Hysterectomy  Per patient Lt meniscal tear   PAIN:  Are you having pain? Yes: NPRS scale: none currently; at worst 5 Pain location: low back Pain description:  sore Aggravating factors: twisting,bending, prolonged sitting Relieving factors: pain medication  PRECAUTIONS: None  WEIGHT BEARING RESTRICTIONS: No  FALLS:  Has patient fallen in last 6 months? No  LIVING ENVIRONMENT: Lives with: lives with their family Lives in: House/apartment Stairs: Yes: Internal: flight steps; on left going up and External: 4 steps; none Has following equipment at home: None  OCCUPATION: unemployed  PLOF: Independent  PATIENT GOALS: I want to be healed.    OBJECTIVE:  Note: Objective measures were completed at Evaluation unless otherwise noted.  DIAGNOSTIC FINDINGS:  Lumbar X-ray: IMPRESSION: 1. Similar mild L4-L5 and L5-S1 disc space narrowing. 2. Lower lumbar facet arthropathy.  PATIENT SURVEYS:  Modified Oswestry: 6% disability  COGNITION: Overall cognitive status: Within functional limits for tasks assessed     SENSATION: Not tested  MUSCLE LENGTH: Hamstrings: WNL bilaterally   POSTURE: decreased lumbar lordosis  PALPATION: [place]/TTP bilateral lumbar paraspinals   LUMBAR ROM:   AROM eval  Flexion Full (ache in low back)   Extension WNL   Right lateral flexion WNL LBP  Left lateral flexion WNL LBP  Right rotation WNL  Left rotation WNL   (Blank rows = not tested)  LOWER EXTREMITY ROM:     Active  Right eval Left eval  Hip flexion    Hip extension    Hip abduction    Hip adduction    Hip internal rotation    Hip external rotation    Knee flexion    Knee extension    Ankle dorsiflexion    Ankle plantarflexion    Ankle inversion    Ankle eversion     (Blank rows = not tested)  LOWER EXTREMITY MMT:    MMT Right eval Left eval  Hip flexion 4+ 4  Hip extension 4- 4-  Hip abduction 4 4-  Hip adduction    Hip internal rotation    Hip external rotation    Knee flexion    Knee extension    Ankle dorsiflexion    Ankle plantarflexion    Ankle inversion    Ankle eversion     (Blank rows = not  tested)  LUMBAR SPECIAL TESTS:  (-) SLR  FUNCTIONAL TESTS:  Squatting: excessive anterior tibial translation, trunk flexion, LOB   GAIT: Distance walked: 15 ft  Assistive device utilized: None Level of assistance: Complete Independence Comments: no obvious gait abnormalities   [facility] Adult PT Treatment:  DATE: 11/19/23  Therapeutic Exercise: Demonstrated,performed, and issued initial HEP.         PATIENT EDUCATION:  Education details: see treatment Person educated: Patient Education method: Explanation, Demonstration, Tactile cues, Verbal cues, and Handouts Education comprehension: verbalized understanding, returned demonstration, verbal cues required, tactile cues required, and needs further education  HOME EXERCISE PROGRAM: Access Code: [email or web address] URL: [email or web address] Date: 11/19/2023 Prepared by: Lucie Meeter  Exercises - Supine Posterior Pelvic Tilt  - 1 x daily - 7 x weekly - 3 sets - 10 reps - 5 sec  hold - Hooklying Clamshell with Resistance  - 1 x daily - 7 x weekly - 3 sets - 10 reps  ASSESSMENT:  CLINICAL IMPRESSION: Patient is a 63 y.o. female who was seen today for physical therapy evaluation and treatment for acute on chronic low back pain with recent exacerbation earlier this month attributed to gardening activity. Upon assessment she is noted to have good lumbar AROM with pain elicited with trunk flexion and lateral flexion. She has bilateral hip weakness and core weakness, aberrant lifting mechanics, and postural abnormalities. She will benefit from skilled PT to address the above stated deficits in order to optimize her function and assist in overall pain reduction.    OBJECTIVE IMPAIRMENTS: decreased activity tolerance, decreased endurance, decreased knowledge of condition, decreased strength, increased fascial restrictions, improper body mechanics, postural dysfunction, and pain.    ACTIVITY LIMITATIONS: carrying, lifting, bending, sitting, and squatting  PARTICIPATION LIMITATIONS: cleaning, community activity, and yard work  PERSONAL FACTORS: Age, Fitness, Time since onset of injury/illness/exacerbation, and 3+ comorbidities: see PMH above are also affecting patient's functional outcome.   REHAB POTENTIAL: Good  CLINICAL DECISION MAKING: Stable/uncomplicated  EVALUATION COMPLEXITY: Low   GOALS: Goals reviewed with patient? Yes  SHORT TERM GOALS: Target date: 12/17/2023    Patient will be independent and compliant with initial HEP.   Baseline: see above Goal status: INITIAL  2.  Patient will demonstrate proper lifting mechanics to reduce stress on her back.  Baseline: see above  Goal status: INITIAL  3.  Patient will demonstrate proper posture with seated activity on the floor to reduce stress on her back.  Baseline: demonstrates: long sit with trunk flexed, or criss crossed with trunk flexed  Goal status: INITIAL    LONG TERM GOALS: Target date: 01/17/24  Patient will demonstrate proper core activation with standing activity to reduce stress on her back with household tasks.  Baseline: poor core activation Goal status: INITIAL  2.  Patient will demonstrate 5/5 bilateral hip strength to improve stability about the chain with prolonged walking/standing activity.  Baseline: see above Goal status: INITIAL  3.  Patient will report pain at worst rated as </= 3/10 to reduce current functional limitations.  Baseline: 5 Goal status: INITIAL  4.  Patient will be independent with advanced home program to assist in management of her chronic condition.  Baseline: see above Goal status: INITIAL   PLAN:  PT FREQUENCY: 1-2x/week  PT DURATION: 8 weeks  PLANNED INTERVENTIONS: 97164- PT Re-evaluation, 97750- Physical Performance Testing, 97110-Therapeutic exercises, 97530- Therapeutic activity, [id number]- Neuromuscular re-education, 97535- Self Care,  97140- Manual therapy, [id number]- Gait training, [id number]- Aquatic Therapy, [phone] (1-2 muscles), 20561 (3+ muscles)- Dry Needling, Cryotherapy, and Moist heat.  PLAN FOR NEXT SESSION: review and progress HEP prn, very challenged with pelvic tilt on eval; core stabilization. Lifting mechanics. Discuss/perform various positional options for seated activity with gardening( side sitting, kneeling, etc.). posture/lifting handout.   Arilynn Blakeney, PT, DPT, ATC  11/19/23 12:56 PM

## 2023-11-21 ENCOUNTER — Ambulatory Visit

## 2023-11-21 DIAGNOSIS — M5459 Other low back pain: Secondary | ICD-10-CM

## 2023-11-21 DIAGNOSIS — M6281 Muscle weakness (generalized): Secondary | ICD-10-CM

## 2023-11-21 NOTE — Patient Instructions (Signed)

## 2023-11-21 NOTE — Therapy (Signed)
 OUTPATIENT PHYSICAL THERAPY THORACOLUMBAR TREATMENT   Patient Name: NINETTA ADELSTEIN MRN: [id number] DOB:27-Aug-1960,63 y.o., female Today's Date: 11/21/2023   END OF SESSION:  PT End of Session - 11/21/23 1103     Visit Number 2    Number of Visits 17    Date for PT Re-Evaluation 01/17/24    Authorization Type [employer]    PT Start Time 1102    PT Stop Time 1144    PT Time Calculation (min) 42 min    Activity Tolerance Patient tolerated treatment well    Behavior During Therapy WFL for tasks assessed/performed            Past Medical History:  Diagnosis Date   Arthritis 2020   Fibroid uterus    GERD (gastroesophageal reflux disease)    PUD (peptic ulcer disease)    Past Surgical History:  Procedure Laterality Date   ABDOMINAL HYSTERECTOMY  2017   CESAREAN SECTION     CHOLECYSTECTOMY     TOTAL ABDOMINAL HYSTERECTOMY W/ BILATERAL SALPINGOOPHORECTOMY  08/2016   Patient Active Problem List   Diagnosis Date Noted   Right foot pain 10/31/2023   Lumbar degenerative disc disease 10/28/2023   Chest pain 05/12/2022   Left knee injury 06/15/2021   Metatarsalgia, left foot 08/02/2020   Memory impairment 02/14/2020   Hearing loss 12/10/2019   Irritable bowel syndrome with constipation 03/05/2019   RLS (restless legs syndrome) 07/15/2018   Seborrheic dermatitis 07/15/2018   Primary osteoarthritis of both hands 05/02/2017   Right carpal tunnel syndrome 05/02/2017   Dyshidrotic eczema 12/24/2016   Uterovaginal prolapse 08/27/2016   Bipolar 1 disorder (HCC) 08/02/2016   Moderate episode of recurrent major depressive disorder (HCC) 05/07/2016   Uterine fibroid 09/18/2015   Chronic migraine without aura without status migrainosus, not intractable 05/02/2015   Medication overuse headache 05/02/2015   Headache, menstrual migraine, intractable, with status migrainosus 12/09/2014   GERD (gastroesophageal reflux disease) 11/29/2014   Paroxysmal SVT (supraventricular  tachycardia) (HCC) 06/10/2012   CIGARETTE SMOKER 08/24/2009   Migraine without aura 09/07/2008   HYPERTRIGLYCERIDEMIA 07/19/2008   Premenstrual tension syndrome 04/24/2006   HOT FLASHES 04/24/2006   Acute left-sided low back pain with left-sided sciatica 04/24/2006    PCP: Alvan Dorothyann BIRCH, MD   REFERRING PROVIDER: Alvan Dorothyann BIRCH, MD   REFERRING DIAG:  M54.50,G89.29 (ICD-10-CM) - Chronic midline low back pain without sciatica  R07.81 (ICD-10-CM) - Rib pain on left side  M54.6 (ICD-10-CM) - Acute midline thoracic back pain    Rationale for Evaluation and Treatment: Rehabilitation  THERAPY DIAG:  Other low back pain  Muscle weakness (generalized)  ONSET DATE: acute on chronic; recent exacerbation August 2025   SUBJECTIVE:  SUBJECTIVE STATEMENT: Patient reports back is feeling ok right now. Has not had a chance to complete initial HEP   EVAL: Patient reports her back pain started around 20 years when she herniated her disc and thinks it was from working out. Every few years she would hurt it again. Her most recent exacerbation of pain began at the beginning of August. She was doing a lot of yard work. She thinks it was from sitting pulling weeds where she was having to twist and turn a lot. She underwent physical therapy when she initially hurt her back, which was helpful. The pain is now intermittent about the lower back. Sometimes the pain will radiate down the posterior thighs bilaterally. She also reports a meniscus tear in the Lt knee that was treated conservatively, which will occasionally hurt after strenuous activity or twisting activity. No numbness/tingling. No red flag symptoms.   PERTINENT HISTORY:  Hearing loss Hysterectomy  Per patient Lt meniscal tear   PAIN:  Are you  having pain? Yes: NPRS scale: none currently; at worst 5 Pain location: low back Pain description: sore Aggravating factors: twisting,bending, prolonged sitting Relieving factors: pain medication  PRECAUTIONS: None  WEIGHT BEARING RESTRICTIONS: No  FALLS:  Has patient fallen in last 6 months? No  LIVING ENVIRONMENT: Lives with: lives with their family Lives in: House/apartment Stairs: Yes: Internal: flight steps; on left going up and External: 4 steps; none Has following equipment at home: None  OCCUPATION: unemployed  PLOF: Independent  PATIENT GOALS: I want to be healed.    OBJECTIVE:  Note: Objective measures were completed at Evaluation unless otherwise noted.  DIAGNOSTIC FINDINGS:  Lumbar X-ray: IMPRESSION: 1. Similar mild L4-L5 and L5-S1 disc space narrowing. 2. Lower lumbar facet arthropathy.  PATIENT SURVEYS:  Modified Oswestry: 6% disability  COGNITION: Overall cognitive status: Within functional limits for tasks assessed     SENSATION: Not tested  MUSCLE LENGTH: Hamstrings: WNL bilaterally   POSTURE: decreased lumbar lordosis  PALPATION: Tautness/TTP bilateral lumbar paraspinals   LUMBAR ROM:   AROM eval  Flexion Full (ache in low back)   Extension WNL   Right lateral flexion WNL LBP  Left lateral flexion WNL LBP  Right rotation WNL  Left rotation WNL   (Blank rows = not tested)  LOWER EXTREMITY ROM:     Active  Right eval Left eval  Hip flexion    Hip extension    Hip abduction    Hip adduction    Hip internal rotation    Hip external rotation    Knee flexion    Knee extension    Ankle dorsiflexion    Ankle plantarflexion    Ankle inversion    Ankle eversion     (Blank rows = not tested)  LOWER EXTREMITY MMT:    MMT Right eval Left eval  Hip flexion 4+ 4  Hip extension 4- 4-  Hip abduction 4 4-  Hip adduction    Hip internal rotation    Hip external rotation    Knee flexion    Knee extension    Ankle  dorsiflexion    Ankle plantarflexion    Ankle inversion    Ankle eversion     (Blank rows = not tested)  LUMBAR SPECIAL TESTS:  (-) SLR  FUNCTIONAL TESTS:  Squatting: excessive anterior tibial translation, trunk flexion, LOB   GAIT: Distance walked: 15 ft  Assistive device utilized: None Level of assistance: Complete Independence Comments: no obvious gait abnormalities  [facility] Adult PT  Treatment:                                                DATE: 11/21/23  Neuromuscular re-ed: Supine posterior pelvic tilts x 10 Therapeutic Activity: Seated hip hinge with dowel x 10 Standing hip hinge holding dowel in front x 10  Standing hip hinge with foam roller at wall x 10   Self Care: Educated on posture and lifting mechanics and mechanics with household activities with handout provided.   [facility] Adult PT Treatment:                                                DATE: 11/19/23  Therapeutic Exercise: Demonstrated,performed, and issued initial HEP.         PATIENT EDUCATION:  Education details: see treatment  Person educated: Patient Education method: Explanation, Demonstration, Tactile cues, Verbal cues, and Handouts Education comprehension: verbalized understanding, returned demonstration, verbal cues required, tactile cues required, and needs further education  HOME EXERCISE PROGRAM: Access Code: [email or web address] URL: [email or web address] Date: 11/19/2023 Prepared by: Lucie Meeter  Exercises - Supine Posterior Pelvic Tilt  - 1 x daily - 7 x weekly - 3 sets - 10 reps - 5 sec  hold - Hooklying Clamshell with Resistance  - 1 x daily - 7 x weekly - 3 sets - 10 reps  ASSESSMENT:  CLINICAL IMPRESSION: Patient tolerated session well today, introducing lifting mechanics. Focused on hip hinge activity with patient requiring consistent cues to reduce excessive knee and trunk flexion with standing hip hinge. She did not tolerate dowel behind her back in standing due to  shoulder pain. Better mechanics noted with hip hinge at wall utilized foam roller, but does still occasionally demo excessive knee flexion. Length discussion regarding lifting mechanics and positioning with household tasks with handout provided.   EVAL: Patient is a 63 y.o. female who was seen today for physical therapy evaluation and treatment for acute on chronic low back pain with recent exacerbation earlier this month attributed to gardening activity. Upon assessment she is noted to have good lumbar AROM with pain elicited with trunk flexion and lateral flexion. She has bilateral hip weakness and core weakness, aberrant lifting mechanics, and postural abnormalities. She will benefit from skilled PT to address the above stated deficits in order to optimize her function and assist in overall pain reduction.    OBJECTIVE IMPAIRMENTS: decreased activity tolerance, decreased endurance, decreased knowledge of condition, decreased strength, increased fascial restrictions, improper body mechanics, postural dysfunction, and pain.   ACTIVITY LIMITATIONS: carrying, lifting, bending, sitting, and squatting  PARTICIPATION LIMITATIONS: cleaning, community activity, and yard work  PERSONAL FACTORS: Age, Fitness, Time since onset of injury/illness/exacerbation, and 3+ comorbidities: see PMH above are also affecting patient's functional outcome.   REHAB POTENTIAL: Good  CLINICAL DECISION MAKING: Stable/uncomplicated  EVALUATION COMPLEXITY: Low   GOALS: Goals reviewed with patient? Yes  SHORT TERM GOALS: Target date: 12/17/2023    Patient will be independent and compliant with initial HEP.   Baseline: see above Goal status: INITIAL  2.  Patient will demonstrate proper lifting mechanics to reduce stress on her back.  Baseline: see above  Goal status: INITIAL  3.  Patient will demonstrate proper posture with seated activity  on the floor to reduce stress on her back.  Baseline: demonstrates: long  sit with trunk flexed, or criss crossed with trunk flexed  Goal status: INITIAL    LONG TERM GOALS: Target date: 01/17/24  Patient will demonstrate proper core activation with standing activity to reduce stress on her back with household tasks.  Baseline: poor core activation Goal status: INITIAL  2.  Patient will demonstrate 5/5 bilateral hip strength to improve stability about the chain with prolonged walking/standing activity.  Baseline: see above Goal status: INITIAL  3.  Patient will report pain at worst rated as </= 3/10 to reduce current functional limitations.  Baseline: 5 Goal status: INITIAL  4.  Patient will be independent with advanced home program to assist in management of her chronic condition.  Baseline: see above Goal status: INITIAL   PLAN:  PT FREQUENCY: 1-2x/week  PT DURATION: 8 weeks  PLANNED INTERVENTIONS: 97164- PT Re-evaluation, 97750- Physical Performance Testing, 97110-Therapeutic exercises, 97530- Therapeutic activity, [id number]- Neuromuscular re-education, 97535- Self Care, [id number]- Manual therapy, [id number]- Gait training, [phone]- Aquatic Therapy, [phone] (1-2 muscles), 20561 (3+ muscles)- Dry Needling, Cryotherapy, and Moist heat.  PLAN FOR NEXT SESSION: review and progress HEP prn.core stabilization. Lifting mechanics. Discuss/perform various positional options for seated activity with gardening( side sitting, kneeling, etc.).   Milarose Savich, PT, DPT, ATC 11/21/23 11:49 AM

## 2023-11-25 ENCOUNTER — Encounter: Payer: Self-pay | Admitting: Sports Medicine

## 2023-11-26 ENCOUNTER — Encounter: Payer: Self-pay | Admitting: Physical Therapy

## 2023-11-26 ENCOUNTER — Ambulatory Visit: Attending: Family Medicine | Admitting: Physical Therapy

## 2023-11-26 DIAGNOSIS — M6281 Muscle weakness (generalized): Secondary | ICD-10-CM | POA: Insufficient documentation

## 2023-11-26 DIAGNOSIS — M5459 Other low back pain: Secondary | ICD-10-CM | POA: Insufficient documentation

## 2023-11-26 NOTE — Therapy (Signed)
 OUTPATIENT PHYSICAL THERAPY THORACOLUMBAR TREATMENT   Patient Name: Anne Mcdonald MRN: [id number] DOB:10-25-1960,63 y.o., female Today's Date: 11/26/2023   END OF SESSION:  PT End of Session - 11/26/23 1143     Visit Number 3    Number of Visits 17    Date for PT Re-Evaluation 01/17/24    Authorization Type [employer]    PT Start Time 1100    PT Stop Time 1141    PT Time Calculation (min) 41 min    Activity Tolerance Patient tolerated treatment well    Behavior During Therapy [facility] for tasks assessed/performed             Past Medical History:  Diagnosis Date   Arthritis 2020   Fibroid uterus    GERD (gastroesophageal reflux disease)    PUD (peptic ulcer disease)    Past Surgical History:  Procedure Laterality Date   ABDOMINAL HYSTERECTOMY  2017   CESAREAN SECTION     CHOLECYSTECTOMY     TOTAL ABDOMINAL HYSTERECTOMY W/ BILATERAL SALPINGOOPHORECTOMY  08/2016   Patient Active Problem List   Diagnosis Date Noted   Right foot pain 10/31/2023   Lumbar degenerative disc disease 10/28/2023   Chest pain 05/12/2022   Left knee injury 06/15/2021   Metatarsalgia, left foot 08/02/2020   Memory impairment 02/14/2020   Hearing loss 12/10/2019   Irritable bowel syndrome with constipation 03/05/2019   RLS (restless legs syndrome) 07/15/2018   Seborrheic dermatitis 07/15/2018   Primary osteoarthritis of both hands 05/02/2017   Right carpal tunnel syndrome 05/02/2017   Dyshidrotic eczema 12/24/2016   Uterovaginal prolapse 08/27/2016   Bipolar 1 disorder (HCC) 08/02/2016   Moderate episode of recurrent major depressive disorder (HCC) 05/07/2016   Uterine fibroid 09/18/2015   Chronic migraine without aura without status migrainosus, not intractable 05/02/2015   Medication overuse headache 05/02/2015   Headache, menstrual migraine, intractable, with status migrainosus 12/09/2014   GERD (gastroesophageal reflux disease) 11/29/2014   Paroxysmal SVT (supraventricular  tachycardia) (HCC) 06/10/2012   CIGARETTE SMOKER 08/24/2009   Migraine without aura 09/07/2008   HYPERTRIGLYCERIDEMIA 07/19/2008   Premenstrual tension syndrome 04/24/2006   HOT FLASHES 04/24/2006   Acute left-sided low back pain with left-sided sciatica 04/24/2006    PCP: Alvan Dorothyann BIRCH, MD   REFERRING PROVIDER: Alvan Dorothyann BIRCH, MD   REFERRING DIAG:  M54.50,G89.29 (ICD-10-CM) - Chronic midline low back pain without sciatica  R07.81 (ICD-10-CM) - Rib pain on left side  M54.6 (ICD-10-CM) - Acute midline thoracic back pain    Rationale for Evaluation and Treatment: Rehabilitation  THERAPY DIAG:  Other low back pain  Muscle weakness (generalized)  ONSET DATE: acute on chronic; recent exacerbation August 2025   SUBJECTIVE:  SUBJECTIVE STATEMENT: Patient reports she raked yesterday and had to lift a dog gate. She was sore last night but she is feeling better this morning after taking some medication  EVAL: Patient reports her back pain started around 20 years when she herniated her disc and thinks it was from working out. Every few years she would hurt it again. Her most recent exacerbation of pain began at the beginning of August. She was doing a lot of yard work. She thinks it was from sitting pulling weeds where she was having to twist and turn a lot. She underwent physical therapy when she initially hurt her back, which was helpful. The pain is now intermittent about the lower back. Sometimes the pain will radiate down the posterior thighs bilaterally. She also reports a meniscus tear in the Lt knee that was treated conservatively, which will occasionally hurt after strenuous activity or twisting activity. No numbness/tingling. No red flag symptoms.   PERTINENT HISTORY:  Hearing  loss Hysterectomy  Per patient Lt meniscal tear   PAIN:  Are you having pain? Yes: NPRS scale: none currently; at worst 5 Pain location: low back Pain description: sore Aggravating factors: twisting,bending, prolonged sitting Relieving factors: pain medication  PRECAUTIONS: None  WEIGHT BEARING RESTRICTIONS: No  FALLS:  Has patient fallen in last 6 months? No  LIVING ENVIRONMENT: Lives with: lives with their family Lives in: House/apartment Stairs: Yes: Internal: flight steps; on left going up and External: 4 steps; none Has following equipment at home: None  OCCUPATION: unemployed  PLOF: Independent  PATIENT GOALS: I want to be healed.    OBJECTIVE:  Note: Objective measures were completed at Evaluation unless otherwise noted.  DIAGNOSTIC FINDINGS:  Lumbar X-ray: IMPRESSION: 1. Similar mild L4-L5 and L5-S1 disc space narrowing. 2. Lower lumbar facet arthropathy.  PATIENT SURVEYS:  Modified Oswestry: 6% disability  COGNITION: Overall cognitive status: Within functional limits for tasks assessed     SENSATION: Not tested  MUSCLE LENGTH: Hamstrings: WNL bilaterally   POSTURE: decreased lumbar lordosis  PALPATION: Tautness/TTP bilateral lumbar paraspinals   LUMBAR ROM:   AROM eval  Flexion Full (ache in low back)   Extension WNL   Right lateral flexion WNL LBP  Left lateral flexion WNL LBP  Right rotation WNL  Left rotation WNL   (Blank rows = not tested)  LOWER EXTREMITY ROM:     Active  Right eval Left eval  Hip flexion    Hip extension    Hip abduction    Hip adduction    Hip internal rotation    Hip external rotation    Knee flexion    Knee extension    Ankle dorsiflexion    Ankle plantarflexion    Ankle inversion    Ankle eversion     (Blank rows = not tested)  LOWER EXTREMITY MMT:    MMT Right eval Left eval  Hip flexion 4+ 4  Hip extension 4- 4-  Hip abduction 4 4-  Hip adduction    Hip internal rotation    Hip  external rotation    Knee flexion    Knee extension    Ankle dorsiflexion    Ankle plantarflexion    Ankle inversion    Ankle eversion     (Blank rows = not tested)  LUMBAR SPECIAL TESTS:  (-) SLR  FUNCTIONAL TESTS:  Squatting: excessive anterior tibial translation, trunk flexion, LOB   GAIT: Distance walked: 15 ft  Assistive device utilized: None Level of assistance: Complete  Independence Comments: no obvious gait abnormalities  [facility] Adult PT Treatment:                                                DATE: 11/26/23  Neuromuscular re-ed: Posterior pelvic tilt Posterior pelvic tilt with bent knee fall out Posterior pelvic tilt with march Supine clam green TB 2 x 10 Therapeutic Activity: Standing hip hinge with cues for technique Pallof press red TB x 10 bilat Pull down red TB with marching Bow and arrow red TB   [facility] Adult PT Treatment:                                                DATE: 11/21/23  Neuromuscular re-ed: Supine posterior pelvic tilts x 10 Therapeutic Activity: Seated hip hinge with dowel x 10 Standing hip hinge holding dowel in front x 10  Standing hip hinge with foam roller at wall x 10   Self Care: Educated on posture and lifting mechanics and mechanics with household activities with handout provided.   [facility] Adult PT Treatment:                                                DATE: 11/19/23  Therapeutic Exercise: Demonstrated,performed, and issued initial HEP.         PATIENT EDUCATION:  Education details: see treatment  Person educated: Patient Education method: Explanation, Demonstration, Tactile cues, Verbal cues, and Handouts Education comprehension: verbalized understanding, returned demonstration, verbal cues required, tactile cues required, and needs further education  HOME EXERCISE PROGRAM: Access Code: [email or web address] URL: [email or web address] Date: 11/26/2023 Prepared by: Darice Conine  Exercises - Supine Posterior  Pelvic Tilt  - 1 x daily - 7 x weekly - 3 sets - 10 reps - 5 sec  hold - Hooklying Clamshell with Resistance  - 1 x daily - 7 x weekly - 3 sets - 10 reps - Supine March with Posterior Pelvic Tilt  - 1 x daily - 7 x weekly - 3 sets - 10 reps - Standing Anti-Rotation Press with Anchored Resistance  - 1 x daily - 7 x weekly - 2 sets - 10 reps  ASSESSMENT:  CLINICAL IMPRESSION: Pt continues to require cues for performing and maintaining posterior pelvic tilt especially with activity. She did improve with repetition and decreasing cues throughout session. Progressed to standing core activation as pt reports she is very active with yard work at home. Updated HEP as pt is going out of town for a week  EVAL: Patient is a 63 y.o. female who was seen today for physical therapy evaluation and treatment for acute on chronic low back pain with recent exacerbation earlier this month attributed to gardening activity. Upon assessment she is noted to have good lumbar AROM with pain elicited with trunk flexion and lateral flexion. She has bilateral hip weakness and core weakness, aberrant lifting mechanics, and postural abnormalities. She will benefit from skilled PT to address the above stated deficits in order to optimize her function and assist in overall pain reduction.      GOALS: Goals reviewed with patient?  Yes  SHORT TERM GOALS: Target date: 12/17/2023    Patient will be independent and compliant with initial HEP.   Baseline: see above Goal status: INITIAL  2.  Patient will demonstrate proper lifting mechanics to reduce stress on her back.  Baseline: see above  Goal status: INITIAL  3.  Patient will demonstrate proper posture with seated activity on the floor to reduce stress on her back.  Baseline: demonstrates: long sit with trunk flexed, or criss crossed with trunk flexed  Goal status: INITIAL    LONG TERM GOALS: Target date: 01/17/24  Patient will demonstrate proper core activation  with standing activity to reduce stress on her back with household tasks.  Baseline: poor core activation Goal status: INITIAL  2.  Patient will demonstrate 5/5 bilateral hip strength to improve stability about the chain with prolonged walking/standing activity.  Baseline: see above Goal status: INITIAL  3.  Patient will report pain at worst rated as </= 3/10 to reduce current functional limitations.  Baseline: 5 Goal status: INITIAL  4.  Patient will be independent with advanced home program to assist in management of her chronic condition.  Baseline: see above Goal status: INITIAL   PLAN:  PT FREQUENCY: 1-2x/week  PT DURATION: 8 weeks  PLANNED INTERVENTIONS: 97164- PT Re-evaluation, 97750- Physical Performance Testing, 97110-Therapeutic exercises, 97530- Therapeutic activity, [id number]- Neuromuscular re-education, 97535- Self Care, 02859- Manual therapy, [id number]- Gait training, [phone]- Aquatic Therapy, [phone] (1-2 muscles), 20561 (3+ muscles)- Dry Needling, Cryotherapy, and Moist heat.  PLAN FOR NEXT SESSION: review and progress HEP prn.core stabilization. Lifting mechanics. Discuss/perform various positional options for seated activity with gardening( side sitting, kneeling, etc.).   Darice Conine, PT,DPT09/05/2509:44 AM

## 2023-11-28 ENCOUNTER — Ambulatory Visit: Admitting: Physical Therapy

## 2023-11-28 ENCOUNTER — Encounter: Payer: Self-pay | Admitting: Physical Therapy

## 2023-11-28 DIAGNOSIS — M5459 Other low back pain: Secondary | ICD-10-CM

## 2023-11-28 DIAGNOSIS — M6281 Muscle weakness (generalized): Secondary | ICD-10-CM

## 2023-11-28 NOTE — Therapy (Signed)
 OUTPATIENT PHYSICAL THERAPY THORACOLUMBAR TREATMENT   Patient Name: Anne Mcdonald MRN: [id number] DOB:09/23/1960,63 y.o., female Today's Date: 11/28/2023   END OF SESSION:  PT End of Session - 11/28/23 0928     Visit Number 4    Number of Visits 17    Date for PT Re-Evaluation 01/17/24    Authorization Type [employer]    PT Start Time 0845    PT Stop Time 0924    PT Time Calculation (min) 39 min    Activity Tolerance Patient tolerated treatment well    Behavior During Therapy [facility] for tasks assessed/performed              Past Medical History:  Diagnosis Date   Arthritis 2020   Fibroid uterus    GERD (gastroesophageal reflux disease)    PUD (peptic ulcer disease)    Past Surgical History:  Procedure Laterality Date   ABDOMINAL HYSTERECTOMY  2017   CESAREAN SECTION     CHOLECYSTECTOMY     TOTAL ABDOMINAL HYSTERECTOMY W/ BILATERAL SALPINGOOPHORECTOMY  08/2016   Patient Active Problem List   Diagnosis Date Noted   Right foot pain 10/31/2023   Lumbar degenerative disc disease 10/28/2023   Chest pain 05/12/2022   Left knee injury 06/15/2021   Metatarsalgia, left foot 08/02/2020   Memory impairment 02/14/2020   Hearing loss 12/10/2019   Irritable bowel syndrome with constipation 03/05/2019   RLS (restless legs syndrome) 07/15/2018   Seborrheic dermatitis 07/15/2018   Primary osteoarthritis of both hands 05/02/2017   Right carpal tunnel syndrome 05/02/2017   Dyshidrotic eczema 12/24/2016   Uterovaginal prolapse 08/27/2016   Bipolar 1 disorder (HCC) 08/02/2016   Moderate episode of recurrent major depressive disorder (HCC) 05/07/2016   Uterine fibroid 09/18/2015   Chronic migraine without aura without status migrainosus, not intractable 05/02/2015   Medication overuse headache 05/02/2015   Headache, menstrual migraine, intractable, with status migrainosus 12/09/2014   GERD (gastroesophageal reflux disease) 11/29/2014   Paroxysmal SVT (supraventricular  tachycardia) (HCC) 06/10/2012   CIGARETTE SMOKER 08/24/2009   Migraine without aura 09/07/2008   HYPERTRIGLYCERIDEMIA 07/19/2008   Premenstrual tension syndrome 04/24/2006   HOT FLASHES 04/24/2006   Acute left-sided low back pain with left-sided sciatica 04/24/2006    PCP: Alvan Dorothyann BIRCH, MD   REFERRING PROVIDER: Alvan Dorothyann BIRCH, MD   REFERRING DIAG:  M54.50,G89.29 (ICD-10-CM) - Chronic midline low back pain without sciatica  R07.81 (ICD-10-CM) - Rib pain on left side  M54.6 (ICD-10-CM) - Acute midline thoracic back pain    Rationale for Evaluation and Treatment: Rehabilitation  THERAPY DIAG:  Other low back pain  Muscle weakness (generalized)  ONSET DATE: acute on chronic; recent exacerbation August 2025   SUBJECTIVE:  SUBJECTIVE STATEMENT: Patient reports  her knee is bothering her today but that her back seems to be a little better  EVAL: Patient reports her back pain started around 20 years when she herniated her disc and thinks it was from working out. Every few years she would hurt it again. Her most recent exacerbation of pain began at the beginning of August. She was doing a lot of yard work. She thinks it was from sitting pulling weeds where she was having to twist and turn a lot. She underwent physical therapy when she initially hurt her back, which was helpful. The pain is now intermittent about the lower back. Sometimes the pain will radiate down the posterior thighs bilaterally. She also reports a meniscus tear in the Lt knee that was treated conservatively, which will occasionally hurt after strenuous activity or twisting activity. No numbness/tingling. No red flag symptoms.   PERTINENT HISTORY:  Hearing loss Hysterectomy  Per patient Lt meniscal tear   PAIN:  Are  you having pain? Yes: NPRS scale: none currently; at worst 5 Pain location: low back Pain description: sore Aggravating factors: twisting,bending, prolonged sitting Relieving factors: pain medication  PRECAUTIONS: None  WEIGHT BEARING RESTRICTIONS: No  FALLS:  Has patient fallen in last 6 months? No  LIVING ENVIRONMENT: Lives with: lives with their family Lives in: House/apartment Stairs: Yes: Internal: flight steps; on left going up and External: 4 steps; none Has following equipment at home: None  OCCUPATION: unemployed  PLOF: Independent  PATIENT GOALS: I want to be healed.    OBJECTIVE:  Note: Objective measures were completed at Evaluation unless otherwise noted.  DIAGNOSTIC FINDINGS:  Lumbar X-ray: IMPRESSION: 1. Similar mild L4-L5 and L5-S1 disc space narrowing. 2. Lower lumbar facet arthropathy.  PATIENT SURVEYS:  Modified Oswestry: 6% disability  COGNITION: Overall cognitive status: Within functional limits for tasks assessed     SENSATION: Not tested  MUSCLE LENGTH: Hamstrings: WNL bilaterally   POSTURE: decreased lumbar lordosis  PALPATION: Tautness/TTP bilateral lumbar paraspinals   LUMBAR ROM:   AROM eval  Flexion Full (ache in low back)   Extension WNL   Right lateral flexion WNL LBP  Left lateral flexion WNL LBP  Right rotation WNL  Left rotation WNL   (Blank rows = not tested)  LOWER EXTREMITY ROM:     Active  Right eval Left eval  Hip flexion    Hip extension    Hip abduction    Hip adduction    Hip internal rotation    Hip external rotation    Knee flexion    Knee extension    Ankle dorsiflexion    Ankle plantarflexion    Ankle inversion    Ankle eversion     (Blank rows = not tested)  LOWER EXTREMITY MMT:    MMT Right eval Left eval  Hip flexion 4+ 4  Hip extension 4- 4-  Hip abduction 4 4-  Hip adduction    Hip internal rotation    Hip external rotation    Knee flexion    Knee extension    Ankle  dorsiflexion    Ankle plantarflexion    Ankle inversion    Ankle eversion     (Blank rows = not tested)  LUMBAR SPECIAL TESTS:  (-) SLR  FUNCTIONAL TESTS:  Squatting: excessive anterior tibial translation, trunk flexion, LOB   GAIT: Distance walked: 15 ft  Assistive device utilized: None Level of assistance: Complete Independence Comments: no obvious gait abnormalities   [facility]  Adult PT Treatment:                                                DATE: 11/28/23 Therapeutic Exercise: Seated piriformis stretch Seated figure 4 stretch Physioball roll outs all directions  Neuromuscular re-ed: Posterior pelvic tilt Posterior pelvic tilt with bent knee fall out Posterior pelvic tilt with march Bridge x 10 Bent over hip ext x 10 bilat Therapeutic Activity: Pull down red TB with march Pallof press red TB x 10 bilat Bow and arrow red TB    [facility] Adult PT Treatment:                                                DATE: 11/26/23  Neuromuscular re-ed: Posterior pelvic tilt Posterior pelvic tilt with bent knee fall out Posterior pelvic tilt with march Supine clam green TB 2 x 10 Therapeutic Activity: Standing hip hinge with cues for technique Pallof press red TB x 10 bilat Pull down red TB with marching Bow and arrow red TB   [facility] Adult PT Treatment:                                                DATE: 11/21/23  Neuromuscular re-ed: Supine posterior pelvic tilts x 10 Therapeutic Activity: Seated hip hinge with dowel x 10 Standing hip hinge holding dowel in front x 10  Standing hip hinge with foam roller at wall x 10   Self Care: Educated on posture and lifting mechanics and mechanics with household activities with handout provided.      PATIENT EDUCATION:  Education details: see treatment  Person educated: Patient Education method: Explanation, Demonstration, Tactile cues, Verbal cues, and Handouts Education comprehension: verbalized understanding, returned demonstration,  verbal cues required, tactile cues required, and needs further education  HOME EXERCISE PROGRAM: Access Code: [email or web address] URL: [email or web address] Date: 11/26/2023 Prepared by: Darice Conine  Exercises - Supine Posterior Pelvic Tilt  - 1 x daily - 7 x weekly - 3 sets - 10 reps - 5 sec  hold - Hooklying Clamshell with Resistance  - 1 x daily - 7 x weekly - 3 sets - 10 reps - Supine March with Posterior Pelvic Tilt  - 1 x daily - 7 x weekly - 3 sets - 10 reps - Standing Anti-Rotation Press with Anchored Resistance  - 1 x daily - 7 x weekly - 2 sets - 10 reps  ASSESSMENT:  CLINICAL IMPRESSION: Pt with increased c/o tightness in Lt low back today. She responded well to stretching. She is improving coordination with core activation but continues to require min cuing. She is going on vacation and will return in 2 weeks  EVAL: Patient is a 63 y.o. female who was seen today for physical therapy evaluation and treatment for acute on chronic low back pain with recent exacerbation earlier this month attributed to gardening activity. Upon assessment she is noted to have good lumbar AROM with pain elicited with trunk flexion and lateral flexion. She has bilateral hip weakness and core weakness, aberrant lifting mechanics, and postural abnormalities. She will benefit from  skilled PT to address the above stated deficits in order to optimize her function and assist in overall pain reduction.      GOALS: Goals reviewed with patient? Yes  SHORT TERM GOALS: Target date: 12/17/2023    Patient will be independent and compliant with initial HEP.   Baseline: see above Goal status: INITIAL  2.  Patient will demonstrate proper lifting mechanics to reduce stress on her back.  Baseline: see above  Goal status: INITIAL  3.  Patient will demonstrate proper posture with seated activity on the floor to reduce stress on her back.  Baseline: demonstrates: long sit with trunk flexed, or criss  crossed with trunk flexed  Goal status: INITIAL    LONG TERM GOALS: Target date: 01/17/24  Patient will demonstrate proper core activation with standing activity to reduce stress on her back with household tasks.  Baseline: poor core activation Goal status: INITIAL  2.  Patient will demonstrate 5/5 bilateral hip strength to improve stability about the chain with prolonged walking/standing activity.  Baseline: see above Goal status: INITIAL  3.  Patient will report pain at worst rated as </= 3/10 to reduce current functional limitations.  Baseline: 5 Goal status: INITIAL  4.  Patient will be independent with advanced home program to assist in management of her chronic condition.  Baseline: see above Goal status: INITIAL   PLAN:  PT FREQUENCY: 1-2x/week  PT DURATION: 8 weeks  PLANNED INTERVENTIONS: 97164- PT Re-evaluation, 97750- Physical Performance Testing, 97110-Therapeutic exercises, 97530- Therapeutic activity, [id number]- Neuromuscular re-education, 97535- Self Care, [id number]- Manual therapy, [id number]- Gait training, [id number]- Aquatic Therapy, [phone] (1-2 muscles), 20561 (3+ muscles)- Dry Needling, Cryotherapy, and Moist heat.  PLAN FOR NEXT SESSION: review and progress HEP prn.core stabilization. Lifting mechanics. Discuss/perform various positional options for seated activity with gardening( side sitting, kneeling, etc.).   Darice Conine, PT,DPT09/05/259:29 AM

## 2023-12-02 ENCOUNTER — Ambulatory Visit: Admitting: Family Medicine

## 2023-12-08 ENCOUNTER — Ambulatory Visit

## 2023-12-10 ENCOUNTER — Ambulatory Visit

## 2023-12-13 ENCOUNTER — Other Ambulatory Visit: Payer: Self-pay

## 2023-12-13 ENCOUNTER — Ambulatory Visit: Admission: EM | Admit: 2023-12-13 | Discharge: 2023-12-13 | Disposition: A | Discharge status: Home / Self Care

## 2023-12-13 ENCOUNTER — Encounter: Payer: Self-pay | Admitting: Emergency Medicine

## 2023-12-13 DIAGNOSIS — R1010 Upper abdominal pain, unspecified: Secondary | ICD-10-CM | POA: Diagnosis not present

## 2023-12-13 LAB — POCT URINALYSIS DIP (MANUAL ENTRY)
Bilirubin, UA: NEGATIVE
Blood, UA: NEGATIVE
Glucose, UA: NEGATIVE mg/dL
Ketones, POC UA: NEGATIVE mg/dL
Leukocytes, UA: NEGATIVE
Nitrite, UA: NEGATIVE
Protein Ur, POC: NEGATIVE mg/dL
Spec Grav, UA: 1.01 (ref 1.010–1.025)
Urobilinogen, UA: 0.2 U/dL
pH, UA: 6 (ref 5.0–8.0)

## 2023-12-13 NOTE — Discharge Instructions (Addendum)
 Advised patient to take omeprazole  first thing in the morning with 12 ounces of water and no other medications, foods, or beverages (other than water) for 45 minutes to allow proton pump inhibitor to take effect.  Advised if symptoms worsen and/or unresolved please follow-up with your PCP or go to [facility] ED for further evaluation of abdominal pain with probable CT of abdomen pelvis with contrast.

## 2023-12-13 NOTE — ED Triage Notes (Signed)
 Pt reports been in [place]  for 2 weeks. Two Thursdays ago started having mid abd pains. Monday pt went to an micrograms in [place]  where was told has a bacterial virus and prescribed prednisone , erythromycin, nausea medications and Dicyclomine . Pt reports while there in [place]  didn't feel good so wasn't eating per her normal and lost 6 pounds that were not intentional. Pt reports that she had to use the nausea medications and Dicyclomine  past two days for the symptoms. Today after eating yogurt starting having abd pains so took Dicyclomine  and then come back. Denies urinary problems. Reports do have GI issues.

## 2023-12-13 NOTE — ED Provider Notes (Signed)
 [facility]    CSN: [id number] Arrival date & time: 12/13/23  1238      History   Chief Complaint Chief Complaint  Patient presents with   Abdominal Pain    HPI Anne Mcdonald is a 63 y.o. female.   HPI 63 year old female presents with abdominal pain for roughly 2 weeks.  Reports was in [place]  and was treated for a viral gastroenteritis with Zithromax  and Bentyl .  PMH significant for peptic ulcer disease, GERD, and fibroid uterus.  Patient is accompanied by her husband this afternoon.  Past Medical History:  Diagnosis Date   Arthritis 2020   Fibroid uterus    GERD (gastroesophageal reflux disease)    PUD (peptic ulcer disease)     Patient Active Problem List   Diagnosis Date Noted   Right foot pain 10/31/2023   Lumbar degenerative disc disease 10/28/2023   Chest pain 05/12/2022   Left knee injury 06/15/2021   Metatarsalgia, left foot 08/02/2020   Memory impairment 02/14/2020   Hearing loss 12/10/2019   Irritable bowel syndrome with constipation 03/05/2019   RLS (restless legs syndrome) 07/15/2018   Seborrheic dermatitis 07/15/2018   Primary osteoarthritis of both hands 05/02/2017   Right carpal tunnel syndrome 05/02/2017   Dyshidrotic eczema 12/24/2016   Uterovaginal prolapse 08/27/2016   Bipolar 1 disorder (HCC) 08/02/2016   Moderate episode of recurrent major depressive disorder (HCC) 05/07/2016   Uterine fibroid 09/18/2015   Chronic migraine without aura without status migrainosus, not intractable 05/02/2015   Medication overuse headache 05/02/2015   Headache, menstrual migraine, intractable, with status migrainosus 12/09/2014   GERD (gastroesophageal reflux disease) 11/29/2014   Paroxysmal SVT (supraventricular tachycardia) (HCC) 06/10/2012   CIGARETTE SMOKER 08/24/2009   Migraine without aura 09/07/2008   HYPERTRIGLYCERIDEMIA 07/19/2008   Premenstrual tension syndrome 04/24/2006   HOT FLASHES 04/24/2006   Acute left-sided low back pain  with left-sided sciatica 04/24/2006    Past Surgical History:  Procedure Laterality Date   ABDOMINAL HYSTERECTOMY  2017   CESAREAN SECTION     CHOLECYSTECTOMY     TOTAL ABDOMINAL HYSTERECTOMY W/ BILATERAL SALPINGOOPHORECTOMY  08/2016    OB History     Gravida  5   Para  5   Term  5   Preterm      AB      Living  5      SAB      IAB      Ectopic      Multiple      Live Births  5            Home Medications    Prior to Admission medications   Medication Sig Start Date End Date Taking? Authorizing Provider  dicyclomine  (BENTYL ) 20 MG tablet Take 20 mg by mouth 3 (three) times daily as needed. 12/08/23  Yes [provider]  ondansetron (ZOFRAN-ODT) 8 MG disintegrating tablet Take 8 mg by mouth 3 (three) times daily as needed. 12/08/23  Yes [provider]  Ascorbic Acid (VITAMIN C) 1000 MG tablet Take by mouth.    [provider]  azithromycin  (ZITHROMAX ) 250 MG tablet Take by mouth.    [provider]  CALCIUM-MAGNESIUM-ZINC PO Take by mouth.    [provider]  clobetasol  ointment (TEMOVATE ) 0.05 % Apply to palms of hands QD prn. 12/24/16   Alvan Dorothyann BIRCH, MD  cyclobenzaprine  (FLEXERIL ) 10 MG tablet Take 1 tablet (10 mg total) by mouth 3 (three) times daily as needed for muscle  spasms. 10/28/23   Alvan Dorothyann BIRCH, MD  ketoconazole  (NIZORAL ) 2 % shampoo Apply 1 Application topically 2 (two) times a week. 04/10/23   Alvan Dorothyann BIRCH, MD  LamoTRIgine 200 MG TB24 24 hour tablet Take 1 tablet by mouth daily. 08/08/20   Hulme, Aruna C, NP  LINZESS  290 MCG CAPS capsule TAKE 1 CAPSULE BY MOUTH EVERY DAY BEFORE BREAKFAST 11/14/23   Alvan Dorothyann BIRCH, MD  Multiple Vitamin (THERA) TABS Take by mouth.    [provider]  nebivolol  (BYSTOLIC ) 2.5 MG tablet TAKE 1 TABLET (2.5 MG TOTAL) BY MOUTH DAILY. NEEDS APPT 02/10/23   Alvan Dorothyann BIRCH, MD  predniSONE  (DELTASONE ) 20 MG tablet Take 2 tablets (40 mg  total) by mouth daily with breakfast. 10/28/23   Alvan Dorothyann BIRCH, MD  Rimegepant Sulfate (NURTEC) 75 MG TBDP Take 1 tablet (75 mg total) by mouth every other day. 12/13/22   Nance Gaskins, Darice BRAVO, PA-C  rizatriptan  (MAXALT -MLT) 10 MG disintegrating tablet Take 1 tablet (10 mg total) by mouth as needed for migraine. May repeat in 2 hours if needed 10/28/23   Alvan Dorothyann BIRCH, MD  rOPINIRole  (REQUIP ) 1 MG tablet TAKE 1 TABLET BY MOUTH AT BEDTIME. MAY TAKE 1 EXTRA TAB PER DAY IF NEEDED 02/10/23   Alvan Dorothyann BIRCH, MD  traZODone (DESYREL) 150 MG tablet TAKE 1 TABLET BY MOUTH EVERYDAY AT BEDTIME 05/09/18   [provider]    Family History Family History  Adopted: Yes  Family history unknown: Yes    Social History Social History   Tobacco Use   Smoking status: Every Day    Current packs/day: 1.00    Types: Cigarettes    Passive exposure: Never   Smokeless tobacco: Never   Tobacco comments:    1 pack a day   Vaping Use   Vaping status: Never Used  Substance Use Topics   Alcohol use: No   Drug use: No     Allergies   Other and Trulance  [plecanatide ]   Review of Systems Review of Systems   Physical Exam Triage Vital Signs ED Triage Vitals [12/13/23 1326]  Encounter Vitals Group     BP 104/71     Girls Systolic BP Percentile      Girls Diastolic BP Percentile      Boys Systolic BP Percentile      Boys Diastolic BP Percentile      Pulse Rate 62     Resp 18     Temp 98 F (36.7 C)     Temp Source Oral     SpO2 100 %     Weight      Height      Head Circumference      Peak Flow      Pain Score 6     Pain Loc      Pain Education      Exclude from Growth Chart    No data found.  Updated Vital Signs BP 104/71 (BP Location: Right Arm)   Pulse 62   Temp 98 F (36.7 C) (Oral)   Resp 18   LMP 07/17/2016   SpO2 100%   Visual Acuity Right Eye Distance:   Left Eye Distance:   Bilateral Distance:    Right Eye Near:   Left Eye Near:     Bilateral Near:     Physical Exam Vitals and nursing note reviewed.  Constitutional:      General: She is not in acute distress.  Appearance: She is well-developed and normal weight. She is not ill-appearing.  HENT:     Head: Normocephalic and atraumatic.     Mouth/Throat:     Mouth: Mucous membranes are moist.     Pharynx: Oropharynx is clear.  Eyes:     Extraocular Movements: Extraocular movements intact.     Pupils: Pupils are equal, round, and reactive to light.  Cardiovascular:     Rate and Rhythm: Normal rate and regular rhythm.     Heart sounds: Normal heart sounds.  Pulmonary:     Effort: Pulmonary effort is normal.     Breath sounds: Normal breath sounds. No wheezing, rhonchi or rales.  Abdominal:     General: Abdomen is flat. Bowel sounds are normal. There is no distension.     Palpations: Abdomen is soft. There is no shifting dullness, fluid wave, hepatomegaly, splenomegaly, mass or pulsatile mass.     Tenderness: There is abdominal tenderness in the right upper quadrant and left upper quadrant. There is no right CVA tenderness, left CVA tenderness, guarding or rebound. Negative signs include Murphy's sign, Rovsing's sign, McBurney's sign, psoas sign and obturator sign.     Hernia: No hernia is present.  Neurological:     General: No focal deficit present.     Mental Status: She is alert and oriented to person, place, and time.  Psychiatric:        Mood and Affect: Mood normal.        Behavior: Behavior normal.      UC Treatments / Results  Labs (all labs ordered are listed, but only abnormal results are displayed) Labs Reviewed  POCT URINALYSIS DIP (MANUAL ENTRY)    EKG   Radiology No results found.  Procedures Procedures (including critical care time)  Medications Ordered in UC Medications - No data to display  Initial Impression / Assessment and Plan / UC Course  I have reviewed the triage vital signs and the nursing notes.  Pertinent labs &  imaging results that were available during my care of the patient were reviewed by me and considered in my medical decision making (see chart for details).     MDM: 1.  Pain of upper abdomen-Advised patient to take omeprazole  first thing in the morning with 12 ounces of water and no other medications, foods, or beverages (other than water) for 45 minutes to allow proton pump inhibitor to take effect.  Advised if symptoms worsen and/or unresolved please follow-up with your PCP or go to [facility] ED for further evaluation of abdominal pain with probable CT of abdomen pelvis with contrast.  Patient discharged home, hemodynamically stable Final Clinical Impressions(s) / UC Diagnoses   Final diagnoses:  Pain of upper abdomen     Discharge Instructions      Advised patient to take omeprazole  first thing in the morning with 12 ounces of water and no other medications, foods, or beverages (other than water) for 45 minutes to allow proton pump inhibitor to take effect.  Advised if symptoms worsen and/or unresolved please follow-up with your PCP or go to [facility] ED for further evaluation of abdominal pain with probable CT of abdomen pelvis with contrast.     ED Prescriptions   None    PDMP not reviewed this encounter.   Teddy Sharper, FNP 12/13/23 1620

## 2023-12-15 ENCOUNTER — Ambulatory Visit

## 2023-12-15 DIAGNOSIS — M5459 Other low back pain: Secondary | ICD-10-CM | POA: Diagnosis not present

## 2023-12-15 DIAGNOSIS — M6281 Muscle weakness (generalized): Secondary | ICD-10-CM

## 2023-12-15 NOTE — Therapy (Signed)
 OUTPATIENT PHYSICAL THERAPY THORACOLUMBAR TREATMENT   Patient Name: Anne Mcdonald MRN: [id number] DOB:09-19-1960,63 y.o., female Today's Date: 12/15/2023   END OF SESSION:  PT End of Session - 12/15/23 1105     Visit Number 5    Number of Visits 17    Date for Recertification  01/17/24    Authorization Type [employer]    PT Start Time 1105    PT Stop Time 1145    PT Time Calculation (min) 40 min    Activity Tolerance Patient tolerated treatment well    Behavior During Therapy [facility] for tasks assessed/performed               Past Medical History:  Diagnosis Date   Arthritis 2020   Fibroid uterus    GERD (gastroesophageal reflux disease)    PUD (peptic ulcer disease)    Past Surgical History:  Procedure Laterality Date   ABDOMINAL HYSTERECTOMY  2017   CESAREAN SECTION     CHOLECYSTECTOMY     TOTAL ABDOMINAL HYSTERECTOMY W/ BILATERAL SALPINGOOPHORECTOMY  08/2016   Patient Active Problem List   Diagnosis Date Noted   Right foot pain 10/31/2023   Lumbar degenerative disc disease 10/28/2023   Chest pain 05/12/2022   Left knee injury 06/15/2021   Metatarsalgia, left foot 08/02/2020   Memory impairment 02/14/2020   Hearing loss 12/10/2019   Irritable bowel syndrome with constipation 03/05/2019   RLS (restless legs syndrome) 07/15/2018   Seborrheic dermatitis 07/15/2018   Primary osteoarthritis of both hands 05/02/2017   Right carpal tunnel syndrome 05/02/2017   Dyshidrotic eczema 12/24/2016   Uterovaginal prolapse 08/27/2016   Bipolar 1 disorder (HCC) 08/02/2016   Moderate episode of recurrent major depressive disorder (HCC) 05/07/2016   Uterine fibroid 09/18/2015   Chronic migraine without aura without status migrainosus, not intractable 05/02/2015   Medication overuse headache 05/02/2015   Headache, menstrual migraine, intractable, with status migrainosus 12/09/2014   GERD (gastroesophageal reflux disease) 11/29/2014   Paroxysmal SVT (supraventricular  tachycardia) (HCC) 06/10/2012   CIGARETTE SMOKER 08/24/2009   Migraine without aura 09/07/2008   HYPERTRIGLYCERIDEMIA 07/19/2008   Premenstrual tension syndrome 04/24/2006   HOT FLASHES 04/24/2006   Acute left-sided low back pain with left-sided sciatica 04/24/2006    PCP: Alvan Dorothyann BIRCH, MD   REFERRING PROVIDER: Alvan Dorothyann BIRCH, MD   REFERRING DIAG:  M54.50,G89.29 (ICD-10-CM) - Chronic midline low back pain without sciatica  R07.81 (ICD-10-CM) - Rib pain on left side  M54.6 (ICD-10-CM) - Acute midline thoracic back pain    Rationale for Evaluation and Treatment: Rehabilitation  THERAPY DIAG:  Other low back pain  Muscle weakness (generalized)  ONSET DATE: acute on chronic; recent exacerbation August 2025   SUBJECTIVE:  SUBJECTIVE STATEMENT: Patient reports  abdominal pain that was ongoing for 2 weeks and was seen in urgent care on Saturday. Was instructed to f/u with PCP if it does not improve. She reports the abdominal pain has improved over the past few days, but is still present and is unsure what causes her flare ups. Patient reports her back is really sore from all her yard work.   EVAL: Patient reports her back pain started around 20 years when she herniated her disc and thinks it was from working out. Every few years she would hurt it again. Her most recent exacerbation of pain began at the beginning of August. She was doing a lot of yard work. She thinks it was from sitting pulling weeds where she was having to twist and turn a lot. She underwent physical therapy when she initially hurt her back, which was helpful. The pain is now intermittent about the lower back. Sometimes the pain will radiate down the posterior thighs bilaterally. She also reports a meniscus tear in the Lt  knee that was treated conservatively, which will occasionally hurt after strenuous activity or twisting activity. No numbness/tingling. No red flag symptoms.   PERTINENT HISTORY:  Hearing loss Hysterectomy  Per patient Lt meniscal tear   PAIN:  Are you having pain? Yes: NPRS scale: 2 Pain location: low back Pain description: sore Aggravating factors: twisting,bending, prolonged sitting Relieving factors: pain medication  PRECAUTIONS: None  WEIGHT BEARING RESTRICTIONS: No  FALLS:  Has patient fallen in last 6 months? No  LIVING ENVIRONMENT: Lives with: lives with their family Lives in: House/apartment Stairs: Yes: Internal: flight steps; on left going up and External: 4 steps; none Has following equipment at home: None  OCCUPATION: unemployed  PLOF: Independent  PATIENT GOALS: I want to be healed.    OBJECTIVE:  Note: Objective measures were completed at Evaluation unless otherwise noted.  DIAGNOSTIC FINDINGS:  Lumbar X-ray: IMPRESSION: 1. Similar mild L4-L5 and L5-S1 disc space narrowing. 2. Lower lumbar facet arthropathy.  PATIENT SURVEYS:  Modified Oswestry: 6% disability  COGNITION: Overall cognitive status: Within functional limits for tasks assessed     SENSATION: Not tested  MUSCLE LENGTH: Hamstrings: WNL bilaterally   POSTURE: decreased lumbar lordosis  PALPATION: Tautness/TTP bilateral lumbar paraspinals   LUMBAR ROM:   AROM eval  Flexion Full (ache in low back)   Extension WNL   Right lateral flexion WNL LBP  Left lateral flexion WNL LBP  Right rotation WNL  Left rotation WNL   (Blank rows = not tested)  LOWER EXTREMITY ROM:     Active  Right eval Left eval  Hip flexion    Hip extension    Hip abduction    Hip adduction    Hip internal rotation    Hip external rotation    Knee flexion    Knee extension    Ankle dorsiflexion    Ankle plantarflexion    Ankle inversion    Ankle eversion     (Blank rows = not  tested)  LOWER EXTREMITY MMT:    MMT Right eval Left eval  Hip flexion 4+ 4  Hip extension 4- 4-  Hip abduction 4 4-  Hip adduction    Hip internal rotation    Hip external rotation    Knee flexion    Knee extension    Ankle dorsiflexion    Ankle plantarflexion    Ankle inversion    Ankle eversion     (Blank rows = not tested)  LUMBAR SPECIAL TESTS:  (-) SLR  FUNCTIONAL TESTS:  Squatting: excessive anterior tibial translation, trunk flexion, LOB   GAIT: Distance walked: 15 ft  Assistive device utilized: None Level of assistance: Complete Independence Comments: no obvious gait abnormalities  [facility] Adult PT Treatment:                                                DATE: 12/15/23  Neuromuscular re-ed: Paloff press 2 x 10; red band  Supine posterior pelvic tilt x 5; 5 sec hold  Supine TA march 2 x 10  SLR with posterior pelvic tilt x 10 each  Supine bent knee fallout  x 10   Self Care: Recommended f/u with PCP regarding abdominal pain  Discussed bending and lifting mechanics with yard work Manufacturing engineer and returning Manufacturing engineer.   [facility] Adult PT Treatment:                                                DATE: 11/28/23 Therapeutic Exercise: Seated piriformis stretch Seated figure 4 stretch Physioball roll outs all directions  Neuromuscular re-ed: Posterior pelvic tilt Posterior pelvic tilt with bent knee fall out Posterior pelvic tilt with march Bridge x 10 Bent over hip ext x 10 bilat Therapeutic Activity: Pull down red TB with march Pallof press red TB x 10 bilat Bow and arrow red TB    [facility] Adult PT Treatment:                                                DATE: 11/26/23  Neuromuscular re-ed: Posterior pelvic tilt Posterior pelvic tilt with bent knee fall out Posterior pelvic tilt with march Supine clam green TB 2 x 10 Therapeutic Activity: Standing hip hinge with cues for technique Pallof press red TB x 10 bilat Pull down red TB with marching Bow and arrow red  TB   [facility] Adult PT Treatment:                                                DATE: 11/21/23  Neuromuscular re-ed: Supine posterior pelvic tilts x 10 Therapeutic Activity: Seated hip hinge with dowel x 10 Standing hip hinge holding dowel in front x 10  Standing hip hinge with foam roller at wall x 10   Self Care: Educated on posture and lifting mechanics and mechanics with household activities with handout provided.      PATIENT EDUCATION:  Education details: see treatment  Person educated: Patient Education method: Explanation, Demonstration, Tactile cues, Verbal cues,  Education comprehension: verbalized understanding, returned demonstration, verbal cues required, tactile cues required, and needs further education  HOME EXERCISE PROGRAM: Access Code: [email or web address] URL: [email or web address] Date: 11/26/2023 Prepared by: Darice Conine  Exercises - Supine Posterior Pelvic Tilt  - 1 x daily - 7 x weekly - 3 sets - 10 reps - 5 sec  hold - Hooklying Clamshell with Resistance  - 1 x daily - 7 x weekly - 3  sets - 10 reps - Supine March with Posterior Pelvic Tilt  - 1 x daily - 7 x weekly - 3 sets - 10 reps - Standing Anti-Rotation Press with Anchored Resistance  - 1 x daily - 7 x weekly - 2 sets - 10 reps  ASSESSMENT:  CLINICAL IMPRESSION: Patient reports exacerbation of abdominal pain over the past 2 weeks and was seen at urgent care this weekend. Was recommended by urgent care to f/u with PCP if pain continues.We focused on core stabilization today with patient requiring heavy cues for proper TA activation with standing and supine strengthening. Discussed proper bending and lifting mechanics with yard work, requiring continued emphasis at future sessions to correct form.   EVAL: Patient is a 63 y.o. female who was seen today for physical therapy evaluation and treatment for acute on chronic low back pain with recent exacerbation earlier this month attributed to  gardening activity. Upon assessment she is noted to have good lumbar AROM with pain elicited with trunk flexion and lateral flexion. She has bilateral hip weakness and core weakness, aberrant lifting mechanics, and postural abnormalities. She will benefit from skilled PT to address the above stated deficits in order to optimize her function and assist in overall pain reduction.      GOALS: Goals reviewed with patient? Yes  SHORT TERM GOALS: Target date: 12/17/2023    Patient will be independent and compliant with initial HEP.   Baseline: see above Goal status: MET  2.  Patient will demonstrate proper lifting mechanics to reduce stress on her back.  Baseline: see above  Goal status: INITIAL  3.  Patient will demonstrate proper posture with seated activity on the floor to reduce stress on her back.  Baseline: demonstrates: long sit with trunk flexed, or criss crossed with trunk flexed  Goal status: INITIAL    LONG TERM GOALS: Target date: 01/17/24  Patient will demonstrate proper core activation with standing activity to reduce stress on her back with household tasks.  Baseline: poor core activation Goal status: INITIAL  2.  Patient will demonstrate 5/5 bilateral hip strength to improve stability about the chain with prolonged walking/standing activity.  Baseline: see above Goal status: INITIAL  3.  Patient will report pain at worst rated as </= 3/10 to reduce current functional limitations.  Baseline: 5 Goal status: INITIAL  4.  Patient will be independent with advanced home program to assist in management of her chronic condition.  Baseline: see above Goal status: INITIAL   PLAN:  PT FREQUENCY: 1-2x/week  PT DURATION: 8 weeks  PLANNED INTERVENTIONS: 97164- PT Re-evaluation, 97750- Physical Performance Testing, 97110-Therapeutic exercises, 97530- Therapeutic activity, [id number]- Neuromuscular re-education, 97535- Self Care, 02859- Manual therapy, [id number]- Gait training,  [phone]- Aquatic Therapy, [phone] (1-2 muscles), 20561 (3+ muscles)- Dry Needling, Cryotherapy, and Moist heat.  PLAN FOR NEXT SESSION: review and progress HEP prn.core stabilization. Lifting mechanics. Discuss/perform various positional options for seated activity with gardening( side sitting, kneeling, etc.). check STG  Lucie Meeter, PT, DPT, ATC 12/15/23 11:46 AM

## 2023-12-17 ENCOUNTER — Ambulatory Visit

## 2023-12-17 DIAGNOSIS — M5459 Other low back pain: Secondary | ICD-10-CM

## 2023-12-17 DIAGNOSIS — M6281 Muscle weakness (generalized): Secondary | ICD-10-CM

## 2023-12-17 NOTE — Therapy (Signed)
 OUTPATIENT PHYSICAL THERAPY THORACOLUMBAR TREATMENT   Patient Name: Anne Mcdonald MRN: [id number] DOB:[birth year],63 y.o., female Today's Date: 12/17/2023   END OF SESSION:  PT End of Session - 12/17/23 1102     Visit Number 6    Number of Visits 17    Date for Recertification  01/17/24    Authorization Type [employer]    PT Start Time 1102    PT Stop Time 1145    PT Time Calculation (min) 43 min    Activity Tolerance Patient tolerated treatment well    Behavior During Therapy WFL for tasks assessed/performed                Past Medical History:  Diagnosis Date   Arthritis 2020   Fibroid uterus    GERD (gastroesophageal reflux disease)    PUD (peptic ulcer disease)    Past Surgical History:  Procedure Laterality Date   ABDOMINAL HYSTERECTOMY  2017   CESAREAN SECTION     CHOLECYSTECTOMY     TOTAL ABDOMINAL HYSTERECTOMY W/ BILATERAL SALPINGOOPHORECTOMY  08/2016   Patient Active Problem List   Diagnosis Date Noted   Right foot pain 10/31/2023   Lumbar degenerative disc disease 10/28/2023   Chest pain 05/12/2022   Left knee injury 06/15/2021   Metatarsalgia, left foot 08/02/2020   Memory impairment 02/14/2020   Hearing loss 12/10/2019   Irritable bowel syndrome with constipation 03/05/2019   RLS (restless legs syndrome) 07/15/2018   Seborrheic dermatitis 07/15/2018   Primary osteoarthritis of both hands 05/02/2017   Right carpal tunnel syndrome 05/02/2017   Dyshidrotic eczema 12/24/2016   Uterovaginal prolapse 08/27/2016   Bipolar 1 disorder (HCC) 08/02/2016   Moderate episode of recurrent major depressive disorder (HCC) 05/07/2016   Uterine fibroid 09/18/2015   Chronic migraine without aura without status migrainosus, not intractable 05/02/2015   Medication overuse headache 05/02/2015   Headache, menstrual migraine, intractable, with status migrainosus 12/09/2014   GERD (gastroesophageal reflux disease) 11/29/2014   Paroxysmal SVT (supraventricular  tachycardia) 06/10/2012   CIGARETTE SMOKER 08/24/2009   Migraine without aura 09/07/2008   HYPERTRIGLYCERIDEMIA 07/19/2008   Premenstrual tension syndrome 04/24/2006   HOT FLASHES 04/24/2006   Acute left-sided low back pain with left-sided sciatica 04/24/2006    PCP: Alvan Dorothyann BIRCH, MD   REFERRING PROVIDER: Alvan Dorothyann BIRCH, MD   REFERRING DIAG:  M54.50,G89.29 (ICD-10-CM) - Chronic midline low back pain without sciatica  R07.81 (ICD-10-CM) - Rib pain on left side  M54.6 (ICD-10-CM) - Acute midline thoracic back pain    Rationale for Evaluation and Treatment: Rehabilitation  THERAPY DIAG:  Other low back pain  Muscle weakness (generalized)  ONSET DATE: acute on chronic; recent exacerbation August 2025   SUBJECTIVE:  SUBJECTIVE STATEMENT: Patient reports her back is not hurting right now, but her Rt hip is sore. Was able to schedule an appointment with Dr. Alvan at the end of October for her GI issues.   EVAL: Patient reports her back pain started around 20 years when she herniated her disc and thinks it was from working out. Every few years she would hurt it again. Her most recent exacerbation of pain began at the beginning of August. She was doing a lot of yard work. She thinks it was from sitting pulling weeds where she was having to twist and turn a lot. She underwent physical therapy when she initially hurt her back, which was helpful. The pain is now intermittent about the lower back. Sometimes the pain will radiate down the posterior thighs bilaterally. She also reports a meniscus tear in the Lt knee that was treated conservatively, which will occasionally hurt after strenuous activity or twisting activity. No numbness/tingling. No red flag symptoms.   PERTINENT HISTORY:  Hearing  loss Hysterectomy  Per patient Lt meniscal tear   PAIN:  Are you having pain? Yes: NPRS scale: 2 Pain location: Rt hip Pain description: sore Aggravating factors: overactivity Relieving factors: pain medication  PRECAUTIONS: None  WEIGHT BEARING RESTRICTIONS: No  FALLS:  Has patient fallen in last 6 months? No  LIVING ENVIRONMENT: Lives with: lives with their family Lives in: House/apartment Stairs: Yes: Internal: flight steps; on left going up and External: 4 steps; none Has following equipment at home: None  OCCUPATION: unemployed  PLOF: Independent  PATIENT GOALS: I want to be healed.    OBJECTIVE:  Note: Objective measures were completed at Evaluation unless otherwise noted.  DIAGNOSTIC FINDINGS:  Lumbar X-ray: IMPRESSION: 1. Similar mild L4-L5 and L5-S1 disc space narrowing. 2. Lower lumbar facet arthropathy.  PATIENT SURVEYS:  Modified Oswestry: 6% disability  COGNITION: Overall cognitive status: Within functional limits for tasks assessed     SENSATION: Not tested  MUSCLE LENGTH: Hamstrings: WNL bilaterally   POSTURE: decreased lumbar lordosis  PALPATION: Tautness/TTP bilateral lumbar paraspinals   LUMBAR ROM:   AROM eval  Flexion Full (ache in low back)   Extension WNL   Right lateral flexion WNL LBP  Left lateral flexion WNL LBP  Right rotation WNL  Left rotation WNL   (Blank rows = not tested)  LOWER EXTREMITY ROM:     Active  Right eval Left eval  Hip flexion    Hip extension    Hip abduction    Hip adduction    Hip internal rotation    Hip external rotation    Knee flexion    Knee extension    Ankle dorsiflexion    Ankle plantarflexion    Ankle inversion    Ankle eversion     (Blank rows = not tested)  LOWER EXTREMITY MMT:    MMT Right eval Left eval  Hip flexion 4+ 4  Hip extension 4- 4-  Hip abduction 4 4-  Hip adduction    Hip internal rotation    Hip external rotation    Knee flexion    Knee extension     Ankle dorsiflexion    Ankle plantarflexion    Ankle inversion    Ankle eversion     (Blank rows = not tested)  LUMBAR SPECIAL TESTS:  (-) SLR  FUNCTIONAL TESTS:  Squatting: excessive anterior tibial translation, trunk flexion, LOB   GAIT: Distance walked: 15 ft  Assistive device utilized: None Level of assistance: Complete  Independence Comments: no obvious gait abnormalities  [facility] Adult PT Treatment:                                                DATE: 12/17/23  Neuromuscular re-ed: Sidelying open book with core activation x 3 each Seated trunk rotation with core activation x 3 Supine TA march 2 x 10  90/90 hold unable due to weakness   Self Care: Body mechanics with seated activity Half kneel positioning to reduce stress on back with gardening- though patient unstable in this position secondary to weakness  Recommended use of gardening stool to reduce stress on her back Proper lifting mechanics and core activation with rotational movement to reduce stress on her back    [facility] Adult PT Treatment:                                                DATE: 12/15/23  Neuromuscular re-ed: Paloff press 2 x 10; red band  Supine posterior pelvic tilt x 5; 5 sec hold  Supine TA march 2 x 10  SLR with posterior pelvic tilt x 10 each  Supine bent knee fallout  x 10   Self Care: Recommended f/u with PCP regarding abdominal pain  Discussed bending and lifting mechanics with yard work Manufacturing engineer and returning Manufacturing engineer.   [facility] Adult PT Treatment:                                                DATE: 11/28/23 Therapeutic Exercise: Seated piriformis stretch Seated figure 4 stretch Physioball roll outs all directions  Neuromuscular re-ed: Posterior pelvic tilt Posterior pelvic tilt with bent knee fall out Posterior pelvic tilt with march Bridge x 10 Bent over hip ext x 10 bilat Therapeutic Activity: Pull down red TB with march Pallof press red TB x 10 bilat Bow and arrow red TB    [facility]  Adult PT Treatment:                                                DATE: 11/26/23  Neuromuscular re-ed: Posterior pelvic tilt Posterior pelvic tilt with bent knee fall out Posterior pelvic tilt with march Supine clam green TB 2 x 10 Therapeutic Activity: Standing hip hinge with cues for technique Pallof press red TB x 10 bilat Pull down red TB with marching Bow and arrow red TB   [facility] Adult PT Treatment:                                                DATE: 11/21/23  Neuromuscular re-ed: Supine posterior pelvic tilts x 10 Therapeutic Activity: Seated hip hinge with dowel x 10 Standing hip hinge holding dowel in front x 10  Standing hip hinge with foam roller at wall x 10   Self Care: Educated on posture  and lifting mechanics and mechanics with household activities with handout provided.      PATIENT EDUCATION:  Education details: see treatment  Person educated: Patient Education method: Explanation, Demonstration, Tactile cues, Verbal cues,  Education comprehension: verbalized understanding, returned demonstration, verbal cues required, tactile cues required, and needs further education  HOME EXERCISE PROGRAM: Access Code: [email or web address] URL: [email or web address] Date: 11/26/2023 Prepared by: Darice Conine  Exercises - Supine Posterior Pelvic Tilt  - 1 x daily - 7 x weekly - 3 sets - 10 reps - 5 sec  hold - Hooklying Clamshell with Resistance  - 1 x daily - 7 x weekly - 3 sets - 10 reps - Supine March with Posterior Pelvic Tilt  - 1 x daily - 7 x weekly - 3 sets - 10 reps - Standing Anti-Rotation Press with Anchored Resistance  - 1 x daily - 7 x weekly - 2 sets - 10 reps  ASSESSMENT:  CLINICAL IMPRESSION: Heavy emphasis on posture and lifting/bending mechanics with daily activity during today's session. We discussed various positioning for gardening activity demonstrating neutral spine positioning in long sit and criss-crossed seated positioning. We attempted  half kneel, though patient is unstable in this position secondary to weakness. Began rotational core strengthening though patient has difficulty properly engaging core. Attempted 90/90 core stabilization, though due to core weakness unable to maintain.   EVAL: Patient is a 63 y.o. female who was seen today for physical therapy evaluation and treatment for acute on chronic low back pain with recent exacerbation earlier this month attributed to gardening activity. Upon assessment she is noted to have good lumbar AROM with pain elicited with trunk flexion and lateral flexion. She has bilateral hip weakness and core weakness, aberrant lifting mechanics, and postural abnormalities. She will benefit from skilled PT to address the above stated deficits in order to optimize her function and assist in overall pain reduction.      GOALS: Goals reviewed with patient? Yes  SHORT TERM GOALS: Target date: 12/17/2023    Patient will be independent and compliant with initial HEP.   Baseline: see above Goal status: MET  2.  Patient will demonstrate proper lifting mechanics to reduce stress on her back.  Baseline: see above  12/17/23: cues required for proper hip/knee engagement Goal status: ongoing   3.  Patient will demonstrate proper posture with seated activity on the floor to reduce stress on her back.  Baseline: demonstrates: long sit with trunk flexed, or criss crossed with trunk flexed  12/17/23: educated on neutral spine and core activation with seated positioning  Goal status: ongoing     LONG TERM GOALS: Target date: 01/17/24  Patient will demonstrate proper core activation with standing activity to reduce stress on her back with household tasks.  Baseline: poor core activation Goal status: INITIAL  2.  Patient will demonstrate 5/5 bilateral hip strength to improve stability about the chain with prolonged walking/standing activity.  Baseline: see above Goal status: INITIAL  3.  Patient  will report pain at worst rated as </= 3/10 to reduce current functional limitations.  Baseline: 5 Goal status: INITIAL  4.  Patient will be independent with advanced home program to assist in management of her chronic condition.  Baseline: see above Goal status: INITIAL   PLAN:  PT FREQUENCY: 1-2x/week  PT DURATION: 8 weeks  PLANNED INTERVENTIONS: 97164- PT Re-evaluation, 97750- Physical Performance Testing, 97110-Therapeutic exercises, 97530- Therapeutic activity, [id number]- Neuromuscular re-education, 97535- Self Care, 02859- Manual therapy, [id number]- Gait training, [phone]- Aquatic  Therapy, 20560 (1-2 muscles), 20561 (3+ muscles)- Dry Needling, Cryotherapy, and Moist heat.  PLAN FOR NEXT SESSION: review and progress HEP prn.core stabilization. Lifting mechanics. Discuss/perform various positional options for seated activity with gardening( side sitting, kneeling, etc.).   Saharsh Sterling, PT, DPT, ATC 12/17/23 11:50 AM

## 2023-12-23 ENCOUNTER — Ambulatory Visit

## 2023-12-23 ENCOUNTER — Encounter: Payer: Self-pay | Admitting: *Deleted

## 2023-12-23 DIAGNOSIS — M5459 Other low back pain: Secondary | ICD-10-CM

## 2023-12-23 DIAGNOSIS — M6281 Muscle weakness (generalized): Secondary | ICD-10-CM

## 2023-12-23 NOTE — Therapy (Signed)
 OUTPATIENT PHYSICAL THERAPY THORACOLUMBAR TREATMENT   Patient Name: ELISA KUTNER MRN: [id number] DOB:1961-01-25,63 y.o., female Today's Date: 12/23/2023   END OF SESSION:  PT End of Session - 12/23/23 0846     Visit Number 7    Number of Visits 17    Date for Recertification  01/17/24    Authorization Type [employer]    PT Start Time 0849    PT Stop Time 0929    PT Time Calculation (min) 40 min    Activity Tolerance Patient tolerated treatment well    Behavior During Therapy [facility] for tasks assessed/performed                 Past Medical History:  Diagnosis Date   Arthritis 2020   Fibroid uterus    GERD (gastroesophageal reflux disease)    PUD (peptic ulcer disease)    Past Surgical History:  Procedure Laterality Date   ABDOMINAL HYSTERECTOMY  2017   CESAREAN SECTION     CHOLECYSTECTOMY     TOTAL ABDOMINAL HYSTERECTOMY W/ BILATERAL SALPINGOOPHORECTOMY  08/2016   Patient Active Problem List   Diagnosis Date Noted   Right foot pain 10/31/2023   Lumbar degenerative disc disease 10/28/2023   Chest pain 05/12/2022   Left knee injury 06/15/2021   Metatarsalgia, left foot 08/02/2020   Memory impairment 02/14/2020   Hearing loss 12/10/2019   Irritable bowel syndrome with constipation 03/05/2019   RLS (restless legs syndrome) 07/15/2018   Seborrheic dermatitis 07/15/2018   Primary osteoarthritis of both hands 05/02/2017   Right carpal tunnel syndrome 05/02/2017   Dyshidrotic eczema 12/24/2016   Uterovaginal prolapse 08/27/2016   Bipolar 1 disorder (HCC) 08/02/2016   Moderate episode of recurrent major depressive disorder (HCC) 05/07/2016   Uterine fibroid 09/18/2015   Chronic migraine without aura without status migrainosus, not intractable 05/02/2015   Medication overuse headache 05/02/2015   Headache, menstrual migraine, intractable, with status migrainosus 12/09/2014   GERD (gastroesophageal reflux disease) 11/29/2014   Paroxysmal SVT (supraventricular  tachycardia) 06/10/2012   CIGARETTE SMOKER 08/24/2009   Migraine without aura 09/07/2008   HYPERTRIGLYCERIDEMIA 07/19/2008   Premenstrual tension syndrome 04/24/2006   HOT FLASHES 04/24/2006   Acute left-sided low back pain with left-sided sciatica 04/24/2006    PCP: Alvan Dorothyann BIRCH, MD   REFERRING PROVIDER: Alvan Dorothyann BIRCH, MD   REFERRING DIAG:  M54.50,G89.29 (ICD-10-CM) - Chronic midline low back pain without sciatica  R07.81 (ICD-10-CM) - Rib pain on left side  M54.6 (ICD-10-CM) - Acute midline thoracic back pain    Rationale for Evaluation and Treatment: Rehabilitation  THERAPY DIAG:  Other low back pain  Muscle weakness (generalized)  ONSET DATE: acute on chronic; recent exacerbation August 2025   SUBJECTIVE:  SUBJECTIVE STATEMENT: Patient reports she is doing fine today. She reports her back is not hurting right now. She has a busy next couple of weeks. Is wondering how she should be positioned for raking.   EVAL: Patient reports her back pain started around 20 years when she herniated her disc and thinks it was from working out. Every few years she would hurt it again. Her most recent exacerbation of pain began at the beginning of August. She was doing a lot of yard work. She thinks it was from sitting pulling weeds where she was having to twist and turn a lot. She underwent physical therapy when she initially hurt her back, which was helpful. The pain is now intermittent about the lower back. Sometimes the pain will radiate down the posterior thighs bilaterally. She also reports a meniscus tear in the Lt knee that was treated conservatively, which will occasionally hurt after strenuous activity or twisting activity. No numbness/tingling. No red flag symptoms.   PERTINENT HISTORY:   Hearing loss Hysterectomy  Per patient Lt meniscal tear   PAIN:  Are you having pain? No  PRECAUTIONS: None  WEIGHT BEARING RESTRICTIONS: No  FALLS:  Has patient fallen in last 6 months? No  LIVING ENVIRONMENT: Lives with: lives with their family Lives in: House/apartment Stairs: Yes: Internal: flight steps; on left going up and External: 4 steps; none Has following equipment at home: None  OCCUPATION: unemployed  PLOF: Independent  PATIENT GOALS: I want to be healed.    OBJECTIVE:  Note: Objective measures were completed at Evaluation unless otherwise noted.  DIAGNOSTIC FINDINGS:  Lumbar X-ray: IMPRESSION: 1. Similar mild L4-L5 and L5-S1 disc space narrowing. 2. Lower lumbar facet arthropathy.  PATIENT SURVEYS:  Modified Oswestry: 6% disability  COGNITION: Overall cognitive status: Within functional limits for tasks assessed     SENSATION: Not tested  MUSCLE LENGTH: Hamstrings: WNL bilaterally   POSTURE: decreased lumbar lordosis  PALPATION: [place]/TTP bilateral lumbar paraspinals   LUMBAR ROM:   AROM eval  Flexion Full (ache in low back)   Extension WNL   Right lateral flexion WNL LBP  Left lateral flexion WNL LBP  Right rotation WNL  Left rotation WNL   (Blank rows = not tested)  LOWER EXTREMITY ROM:     Active  Right eval Left eval  Hip flexion    Hip extension    Hip abduction    Hip adduction    Hip internal rotation    Hip external rotation    Knee flexion    Knee extension    Ankle dorsiflexion    Ankle plantarflexion    Ankle inversion    Ankle eversion     (Blank rows = not tested)  LOWER EXTREMITY MMT:    MMT Right eval Left eval  Hip flexion 4+ 4  Hip extension 4- 4-  Hip abduction 4 4-  Hip adduction    Hip internal rotation    Hip external rotation    Knee flexion    Knee extension    Ankle dorsiflexion    Ankle plantarflexion    Ankle inversion    Ankle eversion     (Blank rows = not  tested)  LUMBAR SPECIAL TESTS:  (-) SLR  FUNCTIONAL TESTS:  Squatting: excessive anterior tibial translation, trunk flexion, LOB   GAIT: Distance walked: 15 ft  Assistive device utilized: None Level of assistance: Complete Independence Comments: no obvious gait abnormalities   [facility] Adult PT Treatment:  DATE: 12/23/23  Self Care: Educated on proper lifting, bending mechanics, positioning for yard work and household cleaning/chores. Pushing preferable to pulling when possible. Utilized handout and demonstrated proper mechanics as needed.    [facility] Adult PT Treatment:                                                DATE: 12/17/23  Neuromuscular re-ed: Sidelying open book with core activation x 3 each Seated trunk rotation with core activation x 3 Supine TA march 2 x 10  90/90 hold unable due to weakness   Self Care: Body mechanics with seated activity Half kneel positioning to reduce stress on back with gardening- though patient unstable in this position secondary to weakness  Recommended use of gardening stool to reduce stress on her back Proper lifting mechanics and core activation with rotational movement to reduce stress on her back    [facility] Adult PT Treatment:                                                DATE: 12/15/23  Neuromuscular re-ed: Paloff press 2 x 10; red band  Supine posterior pelvic tilt x 5; 5 sec hold  Supine TA march 2 x 10  SLR with posterior pelvic tilt x 10 each  Supine bent knee fallout  x 10   Self Care: Recommended f/u with PCP regarding abdominal pain  Discussed bending and lifting mechanics with yard work Manufacturing engineer and returning Manufacturing engineer.   [facility] Adult PT Treatment:                                                DATE: 11/28/23 Therapeutic Exercise: Seated piriformis stretch Seated figure 4 stretch Physioball roll outs all directions  Neuromuscular re-ed: Posterior pelvic tilt Posterior pelvic tilt with bent  knee fall out Posterior pelvic tilt with march Bridge x 10 Bent over hip ext x 10 bilat Therapeutic Activity: Pull down red TB with march Pallof press red TB x 10 bilat Bow and arrow red TB    [facility] Adult PT Treatment:                                                DATE: 11/26/23  Neuromuscular re-ed: Posterior pelvic tilt Posterior pelvic tilt with bent knee fall out Posterior pelvic tilt with march Supine clam green TB 2 x 10 Therapeutic Activity: Standing hip hinge with cues for technique Pallof press red TB x 10 bilat Pull down red TB with marching Bow and arrow red TB   [facility] Adult PT Treatment:                                                DATE: 11/21/23  Neuromuscular re-ed: Supine posterior pelvic tilts x 10 Therapeutic Activity: Seated hip hinge with dowel x  10 Standing hip hinge holding dowel in front x 10  Standing hip hinge with foam roller at wall x 10   Self Care: Educated on posture and lifting mechanics and mechanics with household activities with handout provided.      PATIENT EDUCATION:  Education details: see treatment  Person educated: Patient Education method: Explanation, Demonstration, Tactile cues, Verbal cues, handout  Education comprehension: verbalized understanding, returned demonstration, verbal cues required, tactile cues required, and needs further education  HOME EXERCISE PROGRAM: Access Code: [email or web address] URL: [email or web address] Date: 11/26/2023 Prepared by: Darice Conine  Exercises - Supine Posterior Pelvic Tilt  - 1 x daily - 7 x weekly - 3 sets - 10 reps - 5 sec  hold - Hooklying Clamshell with Resistance  - 1 x daily - 7 x weekly - 3 sets - 10 reps - Supine March with Posterior Pelvic Tilt  - 1 x daily - 7 x weekly - 3 sets - 10 reps - Standing Anti-Rotation Press with Anchored Resistance  - 1 x daily - 7 x weekly - 2 sets - 10 reps  ASSESSMENT:  CLINICAL IMPRESSION: Today's session was specific to education  regarding body mechanics and positioning with various household and yard work activities. Patient had specific questions regarding raking, laundry, lifting, and pulling activity. We discussed various preferable options for unloading/loading laundry, decreasing raking area, pushing gardening tools as opposed to pulling when possible, and proper lifting mechanics. All questions were answered with patient having plans to introduce some of today's techniques prior to next session to determine overall effects on her back pain.   EVAL: Patient is a 63 y.o. female who was seen today for physical therapy evaluation and treatment for acute on chronic low back pain with recent exacerbation earlier this month attributed to gardening activity. Upon assessment she is noted to have good lumbar AROM with pain elicited with trunk flexion and lateral flexion. She has bilateral hip weakness and core weakness, aberrant lifting mechanics, and postural abnormalities. She will benefit from skilled PT to address the above stated deficits in order to optimize her function and assist in overall pain reduction.      GOALS: Goals reviewed with patient? Yes  SHORT TERM GOALS: Target date: 12/17/2023    Patient will be independent and compliant with initial HEP.   Baseline: see above Goal status: MET  2.  Patient will demonstrate proper lifting mechanics to reduce stress on her back.  Baseline: see above  12/17/23: cues required for proper hip/knee engagement Goal status: ongoing   3.  Patient will demonstrate proper posture with seated activity on the floor to reduce stress on her back.  Baseline: demonstrates: long sit with trunk flexed, or criss crossed with trunk flexed  12/17/23: educated on neutral spine and core activation with seated positioning  Goal status: ongoing     LONG TERM GOALS: Target date: 01/17/24  Patient will demonstrate proper core activation with standing activity to reduce stress on her back  with household tasks.  Baseline: poor core activation Goal status: INITIAL  2.  Patient will demonstrate 5/5 bilateral hip strength to improve stability about the chain with prolonged walking/standing activity.  Baseline: see above Goal status: INITIAL  3.  Patient will report pain at worst rated as </= 3/10 to reduce current functional limitations.  Baseline: 5 Goal status: INITIAL  4.  Patient will be independent with advanced home program to assist in management of her chronic condition.  Baseline: see above Goal status: INITIAL  PLAN:  PT FREQUENCY: 1-2x/week  PT DURATION: 8 weeks  PLANNED INTERVENTIONS: 97164- PT Re-evaluation, 97750- Physical Performance Testing, 97110-Therapeutic exercises, 97530- Therapeutic activity, [id number]- Neuromuscular re-education, 97535- Self Care, [id number]- Manual therapy, [id number]- Gait training, [phone]- Aquatic Therapy, [phone] (1-2 muscles), [id number] (3+ muscles)- Dry Needling, Cryotherapy, and Moist heat.  PLAN FOR NEXT SESSION: review and progress HEP prn.core stabilization. Lifting mechanics/education as needed.  Vanita Cannell, PT, DPT, ATC 12/23/23 10:24 AM

## 2023-12-23 NOTE — Patient Instructions (Signed)

## 2023-12-25 ENCOUNTER — Ambulatory Visit: Attending: Family Medicine

## 2023-12-25 DIAGNOSIS — M5459 Other low back pain: Secondary | ICD-10-CM | POA: Insufficient documentation

## 2023-12-25 DIAGNOSIS — M6281 Muscle weakness (generalized): Secondary | ICD-10-CM | POA: Insufficient documentation

## 2023-12-25 NOTE — Therapy (Signed)
 OUTPATIENT PHYSICAL THERAPY THORACOLUMBAR TREATMENT   Patient Name: Anne Mcdonald MRN: [id number] DOB:02-29-1961,63 y.o., female Today's Date: 12/25/2023   END OF SESSION:  PT End of Session - 12/25/23 1146     Visit Number 8    Number of Visits 17    Date for Recertification  01/17/24    Authorization Type [employer]    PT Start Time 1147    PT Stop Time 1230    PT Time Calculation (min) 43 min    Activity Tolerance Patient tolerated treatment well    Behavior During Therapy [facility] for tasks assessed/performed                  Past Medical History:  Diagnosis Date   Arthritis 2020   Fibroid uterus    GERD (gastroesophageal reflux disease)    PUD (peptic ulcer disease)    Past Surgical History:  Procedure Laterality Date   ABDOMINAL HYSTERECTOMY  2017   CESAREAN SECTION     CHOLECYSTECTOMY     TOTAL ABDOMINAL HYSTERECTOMY W/ BILATERAL SALPINGOOPHORECTOMY  08/2016   Patient Active Problem List   Diagnosis Date Noted   Right foot pain 10/31/2023   Lumbar degenerative disc disease 10/28/2023   Chest pain 05/12/2022   Left knee injury 06/15/2021   Metatarsalgia, left foot 08/02/2020   Memory impairment 02/14/2020   Hearing loss 12/10/2019   Irritable bowel syndrome with constipation 03/05/2019   RLS (restless legs syndrome) 07/15/2018   Seborrheic dermatitis 07/15/2018   Primary osteoarthritis of both hands 05/02/2017   Right carpal tunnel syndrome 05/02/2017   Dyshidrotic eczema 12/24/2016   Uterovaginal prolapse 08/27/2016   Bipolar 1 disorder (HCC) 08/02/2016   Moderate episode of recurrent major depressive disorder (HCC) 05/07/2016   Uterine fibroid 09/18/2015   Chronic migraine without aura without status migrainosus, not intractable 05/02/2015   Medication overuse headache 05/02/2015   Headache, menstrual migraine, intractable, with status migrainosus 12/09/2014   GERD (gastroesophageal reflux disease) 11/29/2014   Paroxysmal SVT  (supraventricular tachycardia) 06/10/2012   CIGARETTE SMOKER 08/24/2009   Migraine without aura 09/07/2008   HYPERTRIGLYCERIDEMIA 07/19/2008   Premenstrual tension syndrome 04/24/2006   HOT FLASHES 04/24/2006   Acute left-sided low back pain with left-sided sciatica 04/24/2006    PCP: Alvan Dorothyann BIRCH, MD   REFERRING PROVIDER: Alvan Dorothyann BIRCH, MD   REFERRING DIAG:  M54.50,G89.29 (ICD-10-CM) - Chronic midline low back pain without sciatica  R07.81 (ICD-10-CM) - Rib pain on left side  M54.6 (ICD-10-CM) - Acute midline thoracic back pain    Rationale for Evaluation and Treatment: Rehabilitation  THERAPY DIAG:  Other low back pain  Muscle weakness (generalized)  ONSET DATE: acute on chronic; recent exacerbation August 2025   SUBJECTIVE:  SUBJECTIVE STATEMENT: Patient reports she sometimes will push and pull her yard bin. Her back is not bothering her, but the knee is hurting when she is walking. She raked and shoveled yesterday without back pain.   EVAL: Patient reports her back pain started around 20 years when she herniated her disc and thinks it was from working out. Every few years she would hurt it again. Her most recent exacerbation of pain began at the beginning of August. She was doing a lot of yard work. She thinks it was from sitting pulling weeds where she was having to twist and turn a lot. She underwent physical therapy when she initially hurt her back, which was helpful. The pain is now intermittent about the lower back. Sometimes the pain will radiate down the posterior thighs bilaterally. She also reports a meniscus tear in the Lt knee that was treated conservatively, which will occasionally hurt after strenuous activity or twisting activity. No numbness/tingling. No red flag  symptoms.   PERTINENT HISTORY:  Hearing loss Hysterectomy  Per patient Lt meniscal tear   PAIN:  Are you having pain? No    PRECAUTIONS: None  WEIGHT BEARING RESTRICTIONS: No  FALLS:  Has patient fallen in last 6 months? No  LIVING ENVIRONMENT: Lives with: lives with their family Lives in: House/apartment Stairs: Yes: Internal: flight steps; on left going up and External: 4 steps; none Has following equipment at home: None  OCCUPATION: unemployed  PLOF: Independent  PATIENT GOALS: I want to be healed.    OBJECTIVE:  Note: Objective measures were completed at Evaluation unless otherwise noted.  DIAGNOSTIC FINDINGS:  Lumbar X-ray: IMPRESSION: 1. Similar mild L4-L5 and L5-S1 disc space narrowing. 2. Lower lumbar facet arthropathy.  PATIENT SURVEYS:  Modified Oswestry: 6% disability  COGNITION: Overall cognitive status: Within functional limits for tasks assessed     SENSATION: Not tested  MUSCLE LENGTH: Hamstrings: WNL bilaterally   POSTURE: decreased lumbar lordosis  PALPATION: [place]/TTP bilateral lumbar paraspinals   LUMBAR ROM:   AROM eval  Flexion Full (ache in low back)   Extension WNL   Right lateral flexion WNL LBP  Left lateral flexion WNL LBP  Right rotation WNL  Left rotation WNL   (Blank rows = not tested)  LOWER EXTREMITY ROM:     Active  Right eval Left eval  Hip flexion    Hip extension    Hip abduction    Hip adduction    Hip internal rotation    Hip external rotation    Knee flexion    Knee extension    Ankle dorsiflexion    Ankle plantarflexion    Ankle inversion    Ankle eversion     (Blank rows = not tested)  LOWER EXTREMITY MMT:    MMT Right eval Left eval  Hip flexion 4+ 4  Hip extension 4- 4-  Hip abduction 4 4-  Hip adduction    Hip internal rotation    Hip external rotation    Knee flexion    Knee extension    Ankle dorsiflexion    Ankle plantarflexion    Ankle inversion    Ankle eversion      (Blank rows = not tested)  LUMBAR SPECIAL TESTS:  (-) SLR  FUNCTIONAL TESTS:  Squatting: excessive anterior tibial translation, trunk flexion, LOB   GAIT: Distance walked: 15 ft  Assistive device utilized: None Level of assistance: Complete Independence Comments: no obvious gait abnormalities  [facility] Adult PT Treatment:  DATE: 12/25/23  Neuromuscular re-ed: Supine TA march 2 x 10  90/90 isometric hold 3 x 10 sec hold  Therapeutic Activity: Deadlift 10 lbs multiple reps focusing on form Lifting 10 lb kettlebell from stool and placing on chair turning whole body to perform multiple reps Farmer's carry attempted d/c due to Lt knee pain   Self Care: Reviewed bending and lifting mechanics, educated on avoiding weighted twisting  Discussed limiting deep squatting and twisting/pivoting due to known meniscal injury  [facility] Adult PT Treatment:                                                DATE: 12/23/23  Self Care: Educated on proper lifting, bending mechanics, positioning for yard work and household cleaning/chores. Pushing preferable to pulling when possible. Utilized handout and demonstrated proper mechanics as needed.    [facility] Adult PT Treatment:                                                DATE: 12/17/23  Neuromuscular re-ed: Sidelying open book with core activation x 3 each Seated trunk rotation with core activation x 3 Supine TA march 2 x 10  90/90 hold unable due to weakness   Self Care: Body mechanics with seated activity Half kneel positioning to reduce stress on back with gardening- though patient unstable in this position secondary to weakness  Recommended use of gardening stool to reduce stress on her back Proper lifting mechanics and core activation with rotational movement to reduce stress on her back    [facility] Adult PT Treatment:                                                DATE: 12/15/23  Neuromuscular re-ed: Paloff  press 2 x 10; red band  Supine posterior pelvic tilt x 5; 5 sec hold  Supine TA march 2 x 10  SLR with posterior pelvic tilt x 10 each  Supine bent knee fallout  x 10   Self Care: Recommended f/u with PCP regarding abdominal pain  Discussed bending and lifting mechanics with yard work Manufacturing engineer and returning Manufacturing engineer.   [facility] Adult PT Treatment:                                                DATE: 11/28/23 Therapeutic Exercise: Seated piriformis stretch Seated figure 4 stretch Physioball roll outs all directions  Neuromuscular re-ed: Posterior pelvic tilt Posterior pelvic tilt with bent knee fall out Posterior pelvic tilt with march Bridge x 10 Bent over hip ext x 10 bilat Therapeutic Activity: Pull down red TB with march Pallof press red TB x 10 bilat Bow and arrow red TB    [facility] Adult PT Treatment:  DATE: 11/26/23  Neuromuscular re-ed: Posterior pelvic tilt Posterior pelvic tilt with bent knee fall out Posterior pelvic tilt with march Supine clam green TB 2 x 10 Therapeutic Activity: Standing hip hinge with cues for technique Pallof press red TB x 10 bilat Pull down red TB with marching Bow and arrow red TB   [facility] Adult PT Treatment:                                                DATE: 11/21/23  Neuromuscular re-ed: Supine posterior pelvic tilts x 10 Therapeutic Activity: Seated hip hinge with dowel x 10 Standing hip hinge holding dowel in front x 10  Standing hip hinge with foam roller at wall x 10   Self Care: Educated on posture and lifting mechanics and mechanics with household activities with handout provided.      PATIENT EDUCATION:  Education details: see treatment  Person educated: Patient Education method: Explanation, Demonstration, Tactile cues, Verbal cues Education comprehension: verbalized understanding, returned demonstration, verbal cues required, tactile cues required, and needs further education  HOME  EXERCISE PROGRAM: Access Code: [email or web address] URL: [email or web address] Date: 11/26/2023 Prepared by: Darice Conine  Exercises - Supine Posterior Pelvic Tilt  - 1 x daily - 7 x weekly - 3 sets - 10 reps - 5 sec  hold - Hooklying Clamshell with Resistance  - 1 x daily - 7 x weekly - 3 sets - 10 reps - Supine March with Posterior Pelvic Tilt  - 1 x daily - 7 x weekly - 3 sets - 10 reps - Standing Anti-Rotation Press with Anchored Resistance  - 1 x daily - 7 x weekly - 2 sets - 10 reps  ASSESSMENT:  CLINICAL IMPRESSION: Focused on lifting activity today with good tolerance. Patient required heavy cues initially for proper deadlift form in order to allow for hip hinge and maintain weight close to her body. With continued practice and cueing she is able to properly perform. Incorporated lifting and carrying as patient continues to have tendency to rotate her trunk with lifting activity at home. She is able to properly perform without onset of pain. Attempted farmer's carry, though this caused left knee pain and was discontinued. Challenged with 90/90 core stabilization progression.   EVAL: Patient is a 63 y.o. female who was seen today for physical therapy evaluation and treatment for acute on chronic low back pain with recent exacerbation earlier this month attributed to gardening activity. Upon assessment she is noted to have good lumbar AROM with pain elicited with trunk flexion and lateral flexion. She has bilateral hip weakness and core weakness, aberrant lifting mechanics, and postural abnormalities. She will benefit from skilled PT to address the above stated deficits in order to optimize her function and assist in overall pain reduction.      GOALS: Goals reviewed with patient? Yes  SHORT TERM GOALS: Target date: 12/17/2023    Patient will be independent and compliant with initial HEP.   Baseline: see above Goal status: MET  2.  Patient will demonstrate proper lifting  mechanics to reduce stress on her back.  Baseline: see above  12/17/23: cues required for proper hip/knee engagement 12/25/23: cues required for hip hinge  Goal status: ongoing   3.  Patient will demonstrate proper posture with seated activity on the floor to reduce stress on her back.  Baseline: demonstrates:  long sit with trunk flexed, or criss crossed with trunk flexed  12/17/23: educated on neutral spine and core activation with seated positioning  Goal status: ongoing     LONG TERM GOALS: Target date: 01/17/24  Patient will demonstrate proper core activation with standing activity to reduce stress on her back with household tasks.  Baseline: poor core activation Goal status: INITIAL  2.  Patient will demonstrate 5/5 bilateral hip strength to improve stability about the chain with prolonged walking/standing activity.  Baseline: see above Goal status: INITIAL  3.  Patient will report pain at worst rated as </= 3/10 to reduce current functional limitations.  Baseline: 5 Goal status: INITIAL  4.  Patient will be independent with advanced home program to assist in management of her chronic condition.  Baseline: see above Goal status: INITIAL   PLAN:  PT FREQUENCY: 1-2x/week  PT DURATION: 8 weeks  PLANNED INTERVENTIONS: 97164- PT Re-evaluation, 97750- Physical Performance Testing, 97110-Therapeutic exercises, 97530- Therapeutic activity, [id number]- Neuromuscular re-education, 97535- Self Care, [id number]- Manual therapy, [id number]- Gait training, [phone]- Aquatic Therapy, [phone] (1-2 muscles), [id number] (3+ muscles)- Dry Needling, Cryotherapy, and Moist heat.  PLAN FOR NEXT SESSION: review and progress HEP prn.core stabilization. Lifting mechanics/education as needed.  Constance Hackenberg, PT, DPT, ATC 12/25/23 12:31 PM

## 2023-12-29 ENCOUNTER — Ambulatory Visit

## 2023-12-29 NOTE — Therapy (Deleted)
 OUTPATIENT PHYSICAL THERAPY THORACOLUMBAR TREATMENT   Patient Name: Anne Mcdonald MRN: [id number] DOB:May 20, 1960,63 y.o., female Today's Date: 12/29/2023   END OF SESSION:            Past Medical History:  Diagnosis Date   Arthritis 2020   Fibroid uterus    GERD (gastroesophageal reflux disease)    PUD (peptic ulcer disease)    Past Surgical History:  Procedure Laterality Date   ABDOMINAL HYSTERECTOMY  2017   CESAREAN SECTION     CHOLECYSTECTOMY     TOTAL ABDOMINAL HYSTERECTOMY W/ BILATERAL SALPINGOOPHORECTOMY  08/2016   Patient Active Problem List   Diagnosis Date Noted   Right foot pain 10/31/2023   Lumbar degenerative disc disease 10/28/2023   Chest pain 05/12/2022   Left knee injury 06/15/2021   Metatarsalgia, left foot 08/02/2020   Memory impairment 02/14/2020   Hearing loss 12/10/2019   Irritable bowel syndrome with constipation 03/05/2019   RLS (restless legs syndrome) 07/15/2018   Seborrheic dermatitis 07/15/2018   Primary osteoarthritis of both hands 05/02/2017   Right carpal tunnel syndrome 05/02/2017   Dyshidrotic eczema 12/24/2016   Uterovaginal prolapse 08/27/2016   Bipolar 1 disorder (HCC) 08/02/2016   Moderate episode of recurrent major depressive disorder (HCC) 05/07/2016   Uterine fibroid 09/18/2015   Chronic migraine without aura without status migrainosus, not intractable 05/02/2015   Medication overuse headache 05/02/2015   Headache, menstrual migraine, intractable, with status migrainosus 12/09/2014   GERD (gastroesophageal reflux disease) 11/29/2014   Paroxysmal SVT (supraventricular tachycardia) 06/10/2012   CIGARETTE SMOKER 08/24/2009   Migraine without aura 09/07/2008   HYPERTRIGLYCERIDEMIA 07/19/2008   Premenstrual tension syndrome 04/24/2006   HOT FLASHES 04/24/2006   Acute left-sided low back pain with left-sided sciatica 04/24/2006    PCP: Alvan Dorothyann BIRCH, MD   REFERRING PROVIDER: Alvan Dorothyann BIRCH, MD    REFERRING DIAG:  M54.50,G89.29 (ICD-10-CM) - Chronic midline low back pain without sciatica  R07.81 (ICD-10-CM) - Rib pain on left side  M54.6 (ICD-10-CM) - Acute midline thoracic back pain    Rationale for Evaluation and Treatment: Rehabilitation  THERAPY DIAG:  No diagnosis found.  ONSET DATE: acute on chronic; recent exacerbation August 2025   SUBJECTIVE:                                                                                                                                                                                           SUBJECTIVE STATEMENT: Patient reports she sometimes will push and pull her yard bin. Her back is not bothering her, but the knee is hurting when she is walking. She raked and shoveled yesterday without back pain.  EVAL: Patient reports her back pain started around 20 years when she herniated her disc and thinks it was from working out. Every few years she would hurt it again. Her most recent exacerbation of pain began at the beginning of August. She was doing a lot of yard work. She thinks it was from sitting pulling weeds where she was having to twist and turn a lot. She underwent physical therapy when she initially hurt her back, which was helpful. The pain is now intermittent about the lower back. Sometimes the pain will radiate down the posterior thighs bilaterally. She also reports a meniscus tear in the Lt knee that was treated conservatively, which will occasionally hurt after strenuous activity or twisting activity. No numbness/tingling. No red flag symptoms.   PERTINENT HISTORY:  Hearing loss Hysterectomy  Per patient Lt meniscal tear   PAIN:  Are you having pain? No    PRECAUTIONS: None  WEIGHT BEARING RESTRICTIONS: No  FALLS:  Has patient fallen in last 6 months? No  LIVING ENVIRONMENT: Lives with: lives with their family Lives in: House/apartment Stairs: Yes: Internal: flight steps; on left going up and External: 4 steps;  none Has following equipment at home: None  OCCUPATION: unemployed  PLOF: Independent  PATIENT GOALS: I want to be healed.    OBJECTIVE:  Note: Objective measures were completed at Evaluation unless otherwise noted.  DIAGNOSTIC FINDINGS:  Lumbar X-ray: IMPRESSION: 1. Similar mild L4-L5 and L5-S1 disc space narrowing. 2. Lower lumbar facet arthropathy.  PATIENT SURVEYS:  Modified Oswestry: 6% disability  COGNITION: Overall cognitive status: Within functional limits for tasks assessed     SENSATION: Not tested  MUSCLE LENGTH: Hamstrings: WNL bilaterally   POSTURE: decreased lumbar lordosis  PALPATION: [place]/TTP bilateral lumbar paraspinals   LUMBAR ROM:   AROM eval  Flexion Full (ache in low back)   Extension WNL   Right lateral flexion WNL LBP  Left lateral flexion WNL LBP  Right rotation WNL  Left rotation WNL   (Blank rows = not tested)  LOWER EXTREMITY ROM:     Active  Right eval Left eval  Hip flexion    Hip extension    Hip abduction    Hip adduction    Hip internal rotation    Hip external rotation    Knee flexion    Knee extension    Ankle dorsiflexion    Ankle plantarflexion    Ankle inversion    Ankle eversion     (Blank rows = not tested)  LOWER EXTREMITY MMT:    MMT Right eval Left eval  Hip flexion 4+ 4  Hip extension 4- 4-  Hip abduction 4 4-  Hip adduction    Hip internal rotation    Hip external rotation    Knee flexion    Knee extension    Ankle dorsiflexion    Ankle plantarflexion    Ankle inversion    Ankle eversion     (Blank rows = not tested)  LUMBAR SPECIAL TESTS:  (-) SLR  FUNCTIONAL TESTS:  Squatting: excessive anterior tibial translation, trunk flexion, LOB   GAIT: Distance walked: 15 ft  Assistive device utilized: None Level of assistance: Complete Independence Comments: no obvious gait abnormalities   [facility] Adult PT Treatment:  DATE:  12/29/23 Therapeutic Exercise: *** Manual Therapy: *** Neuromuscular re-ed: Supine TA march 2 x 10  90/90 isometric hold 3 x 10 sec hold  Therapeutic Activity: Deadlift 10 lbs multiple reps focusing on form Standing to floor transfer Modalities: *** Self Care: ***    [facility] Adult PT Treatment:                                                DATE: 12/25/23  Neuromuscular re-ed: Supine TA march 2 x 10  90/90 isometric hold 3 x 10 sec hold  Therapeutic Activity: Deadlift 10 lbs multiple reps focusing on form Lifting 10 lb kettlebell from stool and placing on chair turning whole body to perform multiple reps Farmer's carry attempted d/c due to Lt knee pain   Self Care: Reviewed bending and lifting mechanics, educated on avoiding weighted twisting  Discussed limiting deep squatting and twisting/pivoting due to known meniscal injury  [facility] Adult PT Treatment:                                                DATE: 12/23/23  Self Care: Educated on proper lifting, bending mechanics, positioning for yard work and household cleaning/chores. Pushing preferable to pulling when possible. Utilized handout and demonstrated proper mechanics as needed.    [facility] Adult PT Treatment:                                                DATE: 12/17/23  Neuromuscular re-ed: Sidelying open book with core activation x 3 each Seated trunk rotation with core activation x 3 Supine TA march 2 x 10  90/90 hold unable due to weakness   Self Care: Body mechanics with seated activity Half kneel positioning to reduce stress on back with gardening- though patient unstable in this position secondary to weakness  Recommended use of gardening stool to reduce stress on her back Proper lifting mechanics and core activation with rotational movement to reduce stress on her back    [facility] Adult PT Treatment:                                                DATE: 12/15/23  Neuromuscular re-ed: Paloff press 2 x 10; red band   Supine posterior pelvic tilt x 5; 5 sec hold  Supine TA march 2 x 10  SLR with posterior pelvic tilt x 10 each  Supine bent knee fallout  x 10   Self Care: Recommended f/u with PCP regarding abdominal pain  Discussed bending and lifting mechanics with yard work Manufacturing engineer and returning Manufacturing engineer.   [facility] Adult PT Treatment:                                                DATE: 11/28/23 Therapeutic Exercise: Seated piriformis stretch Seated figure 4 stretch  Physioball roll outs all directions  Neuromuscular re-ed: Posterior pelvic tilt Posterior pelvic tilt with bent knee fall out Posterior pelvic tilt with march Bridge x 10 Bent over hip ext x 10 bilat Therapeutic Activity: Pull down red TB with march Pallof press red TB x 10 bilat Bow and arrow red TB    [facility] Adult PT Treatment:                                                DATE: 11/26/23  Neuromuscular re-ed: Posterior pelvic tilt Posterior pelvic tilt with bent knee fall out Posterior pelvic tilt with march Supine clam green TB 2 x 10 Therapeutic Activity: Standing hip hinge with cues for technique Pallof press red TB x 10 bilat Pull down red TB with marching Bow and arrow red TB   [facility] Adult PT Treatment:                                                DATE: 11/21/23  Neuromuscular re-ed: Supine posterior pelvic tilts x 10 Therapeutic Activity: Seated hip hinge with dowel x 10 Standing hip hinge holding dowel in front x 10  Standing hip hinge with foam roller at wall x 10   Self Care: Educated on posture and lifting mechanics and mechanics with household activities with handout provided.      PATIENT EDUCATION:  Education details: see treatment  Person educated: Patient Education method: Explanation, Demonstration, Tactile cues, Verbal cues Education comprehension: verbalized understanding, returned demonstration, verbal cues required, tactile cues required, and needs further education  HOME EXERCISE PROGRAM: Access  Code: [email or web address] URL: [email or web address] Date: 11/26/2023 Prepared by: Darice Conine  Exercises - Supine Posterior Pelvic Tilt  - 1 x daily - 7 x weekly - 3 sets - 10 reps - 5 sec  hold - Hooklying Clamshell with Resistance  - 1 x daily - 7 x weekly - 3 sets - 10 reps - Supine March with Posterior Pelvic Tilt  - 1 x daily - 7 x weekly - 3 sets - 10 reps - Standing Anti-Rotation Press with Anchored Resistance  - 1 x daily - 7 x weekly - 2 sets - 10 reps  ASSESSMENT:  CLINICAL IMPRESSION: Focused on lifting activity today with good tolerance. Patient required heavy cues initially for proper deadlift form in order to allow for hip hinge and maintain weight close to her body. With continued practice and cueing she is able to properly perform. Incorporated lifting and carrying as patient continues to have tendency to rotate her trunk with lifting activity at home. She is able to properly perform without onset of pain. Attempted farmer's carry, though this caused left knee pain and was discontinued. Challenged with 90/90 core stabilization progression.   Assessed carryover from last session's lifting activities, pt required  Targeted core stabilization to promote lumbo pelvic stability    EVAL: Patient is a 63 y.o. female who was seen today for physical therapy evaluation and treatment for acute on chronic low back pain with recent exacerbation earlier this month attributed to gardening activity. Upon assessment she is noted to have good lumbar AROM with pain elicited with trunk flexion and lateral flexion. She has bilateral hip weakness and core weakness, aberrant  lifting mechanics, and postural abnormalities. She will benefit from skilled PT to address the above stated deficits in order to optimize her function and assist in overall pain reduction.      GOALS: Goals reviewed with patient? Yes  SHORT TERM GOALS: Target date: 12/17/2023    Patient will be independent and  compliant with initial HEP.   Baseline: see above Goal status: MET  2.  Patient will demonstrate proper lifting mechanics to reduce stress on her back.  Baseline: see above  12/17/23: cues required for proper hip/knee engagement 12/25/23: cues required for hip hinge  Goal status: ongoing   3.  Patient will demonstrate proper posture with seated activity on the floor to reduce stress on her back.  Baseline: demonstrates: long sit with trunk flexed, or criss crossed with trunk flexed  12/17/23: educated on neutral spine and core activation with seated positioning  Goal status: ongoing     LONG TERM GOALS: Target date: 01/17/24  Patient will demonstrate proper core activation with standing activity to reduce stress on her back with household tasks.  Baseline: poor core activation Goal status: INITIAL  2.  Patient will demonstrate 5/5 bilateral hip strength to improve stability about the chain with prolonged walking/standing activity.  Baseline: see above Goal status: INITIAL  3.  Patient will report pain at worst rated as </= 3/10 to reduce current functional limitations.  Baseline: 5 Goal status: INITIAL  4.  Patient will be independent with advanced home program to assist in management of her chronic condition.  Baseline: see above Goal status: INITIAL   PLAN:  PT FREQUENCY: 1-2x/week  PT DURATION: 8 weeks  PLANNED INTERVENTIONS: 97164- PT Re-evaluation, 97750- Physical Performance Testing, 97110-Therapeutic exercises, 97530- Therapeutic activity, [id number]- Neuromuscular re-education, 97535- Self Care, [id number]- Manual therapy, [id number]- Gait training, [phone]- Aquatic Therapy, [phone] (1-2 muscles), 20561 (3+ muscles)- Dry Needling, Cryotherapy, and Moist heat.  PLAN FOR NEXT SESSION: review and progress HEP prn.core stabilization. Lifting mechanics/education as needed.

## 2023-12-31 ENCOUNTER — Encounter

## 2023-12-31 ENCOUNTER — Ambulatory Visit: Admitting: Family Medicine

## 2023-12-31 ENCOUNTER — Encounter: Payer: Self-pay | Admitting: Family Medicine

## 2023-12-31 VITALS — BP 129/66 | HR 54 | Ht 64.0 in | Wt 135.2 lb

## 2023-12-31 DIAGNOSIS — K581 Irritable bowel syndrome with constipation: Secondary | ICD-10-CM | POA: Diagnosis not present

## 2023-12-31 MED ORDER — AMOXICILLIN-POT CLAVULANATE 875-125 MG PO TABS: 1.0000 | ORAL_TABLET | Freq: Two times a day (BID) | ORAL | 0 refills | Status: DC

## 2023-12-31 MED ORDER — LUBIPROSTONE 8 MCG PO CAPS: 8.0000 ug | ORAL_CAPSULE | Freq: Two times a day (BID) | ORAL | 1 refills | Status: DC

## 2023-12-31 MED ORDER — HYDROCODONE BIT-HOMATROP MBR 5-1.5 MG/5ML PO SOLN: 5.0000 mL | Freq: Three times a day (TID) | ORAL | 0 refills | Status: DC | PRN

## 2023-12-31 NOTE — Progress Notes (Signed)
 Acute Office Visit  Subjective:     Patient ID: Anne Mcdonald, female    DOB: 1961-01-08, 63 y.o.   MRN: [id number]  Chief Complaint  Patient presents with   Abdominal Pain    HPI Patient is in today for   Discussed the use of AI scribe software for clinical note transcription with the patient, who gave verbal consent to proceed.  History of Present Illness Anne Mcdonald is a 63 year old female who presents with a cold and gastrointestinal symptoms.  Upper respiratory symptoms - Onset of symptoms about 5  days ago, beginning with a scratchy throat - Progression to nasal congestion and mild cough - Congestion improved with daily use of Mucinex nasal spray, though she prefers for congestion to clear naturally - Mild cough present, productive  - Change in voice noted - No COVID test performed - Concerned about attending her son's wedding due to current symptoms  Gastrointestinal symptoms - History of irritable bowel syndrome diagnosed several years ago - Previously managed with Linzess , which is no longer effective - Bowel movements previously regular, now less so - Gastrointestinal discomfort after consuming rich foods such as tortellini with Alfredo sauce - Symptoms characterized by periods of feeling well alternating with sudden onset of discomfort  Appetite and weight changes - Recent unintentional weight loss of six pounds due to lack of appetite while in [place] , in addition to six pounds of intentional weight loss (total twelve pounds) - Lack of appetite, particularly during recent trip to [place] , resulting in decreased oral intake  Adverse medication reactions - Past adverse reaction to Trulance , causing chest pain and difficulty breathing - Adverse reaction to another unspecified medication, resulting in chest discomfort    ROS      Objective:    BP 129/66   Pulse (!) 54   Ht 5' 4 (1.626 m)   Wt 135 lb 3.2 oz (61.3 kg)   LMP 07/17/2016    SpO2 100%   BMI 23.21 kg/m    Physical Exam Constitutional:      Appearance: Normal appearance.  HENT:     Head: Normocephalic and atraumatic.     Right Ear: Tympanic membrane, ear canal and external ear normal. There is no impacted cerumen.     Left Ear: Tympanic membrane, ear canal and external ear normal. There is no impacted cerumen.     Nose: Nose normal.     Mouth/Throat:     Pharynx: Oropharynx is clear.  Eyes:     Conjunctiva/sclera: Conjunctivae normal.  Cardiovascular:     Rate and Rhythm: Normal rate and regular rhythm.  Pulmonary:     Effort: Pulmonary effort is normal.     Breath sounds: Normal breath sounds.  Musculoskeletal:     Cervical back: Neck supple. No tenderness.  Lymphadenopathy:     Cervical: No cervical adenopathy.  Skin:    General: Skin is warm and dry.  Neurological:     Mental Status: She is alert and oriented to person, place, and time.  Psychiatric:        Mood and Affect: Mood normal.     No results found for any visits on 12/31/23.      Assessment & Plan:   Problem List Items Addressed This Visit       Digestive   Irritable bowel syndrome with constipation - Primary   Relevant Medications   lubiprostone  (AMITIZA ) 8 MCG capsule    Meds ordered this encounter  Medications  amoxicillin-clavulanate (AUGMENTIN) 875-125 MG tablet    Sig: Take 1 tablet by mouth 2 (two) times daily.    Dispense:  14 tablet    Refill:  0   HYDROcodone  bit-homatropine (HYCODAN) 5-1.5 MG/5ML syrup    Sig: Take 5 mLs by mouth every 8 (eight) hours as needed for cough.    Dispense:  120 mL    Refill:  0   lubiprostone  (AMITIZA ) 8 MCG capsule    Sig: Take 1 capsule (8 mcg total) by mouth 2 (two) times daily with a meal.    Dispense:  180 capsule    Refill:  1    Assessment and Plan Assessment & Plan Acute upper respiratory infection Likely viral infection with congestion, scratchy throat, and cough. Discussed concerns about attending son's  wedding. - Advise starting antibiotics on Friday if symptoms do not improve or worsen. - Prescribe cough syrup for symptomatic relief, especially at bedtime. - Advise wearing a mask during hair and nail appointments to prevent spreading infection.  Irritable bowel syndrome Recent change in bowel habits and decreased efficacy of Linzess . Adverse reactions to Trulance  and another medication. Discussed medication switch to improve bowel regularity. - Switch from Linzess  to Amitiza  to improve bowel regularity. - Advise starting Amitiza  after returning from travel to avoid potential side effects during the trip. - Monitor response to Amitiza  and consider further GI evaluation if symptoms persist.   No follow-ups on file.  Dorothyann Byars, MD

## 2023-12-31 NOTE — Therapy (Incomplete)
 OUTPATIENT PHYSICAL THERAPY THORACOLUMBAR TREATMENT   Patient Name: Anne Mcdonald MRN: [id number] DOB:1960/06/22,63 y.o., female Today's Date: 12/31/2023   END OF SESSION:            Past Medical History:  Diagnosis Date   Arthritis 2020   Fibroid uterus    GERD (gastroesophageal reflux disease)    PUD (peptic ulcer disease)    Past Surgical History:  Procedure Laterality Date   ABDOMINAL HYSTERECTOMY  2017   CESAREAN SECTION     CHOLECYSTECTOMY     TOTAL ABDOMINAL HYSTERECTOMY W/ BILATERAL SALPINGOOPHORECTOMY  08/2016   Patient Active Problem List   Diagnosis Date Noted   Right foot pain 10/31/2023   Lumbar degenerative disc disease 10/28/2023   Chest pain 05/12/2022   Left knee injury 06/15/2021   Metatarsalgia, left foot 08/02/2020   Memory impairment 02/14/2020   Hearing loss 12/10/2019   Irritable bowel syndrome with constipation 03/05/2019   RLS (restless legs syndrome) 07/15/2018   Seborrheic dermatitis 07/15/2018   Primary osteoarthritis of both hands 05/02/2017   Right carpal tunnel syndrome 05/02/2017   Dyshidrotic eczema 12/24/2016   Uterovaginal prolapse 08/27/2016   Bipolar 1 disorder (HCC) 08/02/2016   Moderate episode of recurrent major depressive disorder (HCC) 05/07/2016   Uterine fibroid 09/18/2015   Chronic migraine without aura without status migrainosus, not intractable 05/02/2015   Medication overuse headache 05/02/2015   Headache, menstrual migraine, intractable, with status migrainosus 12/09/2014   GERD (gastroesophageal reflux disease) 11/29/2014   Paroxysmal SVT (supraventricular tachycardia) 06/10/2012   CIGARETTE SMOKER 08/24/2009   Migraine without aura 09/07/2008   HYPERTRIGLYCERIDEMIA 07/19/2008   Premenstrual tension syndrome 04/24/2006   HOT FLASHES 04/24/2006   Acute left-sided low back pain with left-sided sciatica 04/24/2006    PCP: Alvan Dorothyann BIRCH, MD   REFERRING PROVIDER: Alvan Dorothyann BIRCH, MD    REFERRING DIAG:  M54.50,G89.29 (ICD-10-CM) - Chronic midline low back pain without sciatica  R07.81 (ICD-10-CM) - Rib pain on left side  M54.6 (ICD-10-CM) - Acute midline thoracic back pain    Rationale for Evaluation and Treatment: Rehabilitation  THERAPY DIAG:  No diagnosis found.  ONSET DATE: acute on chronic; recent exacerbation August 2025   SUBJECTIVE:                                                                                                                                                                                           SUBJECTIVE STATEMENT: Patient reports she sometimes will push and pull her yard bin. Her back is not bothering her, but the knee is hurting when she is walking. She raked and shoveled yesterday without back pain.  EVAL: Patient reports her back pain started around 20 years when she herniated her disc and thinks it was from working out. Every few years she would hurt it again. Her most recent exacerbation of pain began at the beginning of August. She was doing a lot of yard work. She thinks it was from sitting pulling weeds where she was having to twist and turn a lot. She underwent physical therapy when she initially hurt her back, which was helpful. The pain is now intermittent about the lower back. Sometimes the pain will radiate down the posterior thighs bilaterally. She also reports a meniscus tear in the Lt knee that was treated conservatively, which will occasionally hurt after strenuous activity or twisting activity. No numbness/tingling. No red flag symptoms.   PERTINENT HISTORY:  Hearing loss Hysterectomy  Per patient Lt meniscal tear   PAIN:  Are you having pain? No    PRECAUTIONS: None  WEIGHT BEARING RESTRICTIONS: No  FALLS:  Has patient fallen in last 6 months? No  LIVING ENVIRONMENT: Lives with: lives with their family Lives in: House/apartment Stairs: Yes: Internal: flight steps; on left going up and External: 4 steps;  none Has following equipment at home: None  OCCUPATION: unemployed  PLOF: Independent  PATIENT GOALS: I want to be healed.    OBJECTIVE:  Note: Objective measures were completed at Evaluation unless otherwise noted.  DIAGNOSTIC FINDINGS:  Lumbar X-ray: IMPRESSION: 1. Similar mild L4-L5 and L5-S1 disc space narrowing. 2. Lower lumbar facet arthropathy.  PATIENT SURVEYS:  Modified Oswestry: 6% disability  COGNITION: Overall cognitive status: Within functional limits for tasks assessed     SENSATION: Not tested  MUSCLE LENGTH: Hamstrings: WNL bilaterally   POSTURE: decreased lumbar lordosis  PALPATION: [place]/TTP bilateral lumbar paraspinals   LUMBAR ROM:   AROM eval  Flexion Full (ache in low back)   Extension WNL   Right lateral flexion WNL LBP  Left lateral flexion WNL LBP  Right rotation WNL  Left rotation WNL   (Blank rows = not tested)  LOWER EXTREMITY ROM:     Active  Right eval Left eval  Hip flexion    Hip extension    Hip abduction    Hip adduction    Hip internal rotation    Hip external rotation    Knee flexion    Knee extension    Ankle dorsiflexion    Ankle plantarflexion    Ankle inversion    Ankle eversion     (Blank rows = not tested)  LOWER EXTREMITY MMT:    MMT Right eval Left eval  Hip flexion 4+ 4  Hip extension 4- 4-  Hip abduction 4 4-  Hip adduction    Hip internal rotation    Hip external rotation    Knee flexion    Knee extension    Ankle dorsiflexion    Ankle plantarflexion    Ankle inversion    Ankle eversion     (Blank rows = not tested)  LUMBAR SPECIAL TESTS:  (-) SLR  FUNCTIONAL TESTS:  Squatting: excessive anterior tibial translation, trunk flexion, LOB   GAIT: Distance walked: 15 ft  Assistive device utilized: None Level of assistance: Complete Independence Comments: no obvious gait abnormalities  [facility] Adult PT Treatment:  DATE:  12/25/23  Neuromuscular re-ed: Supine TA march 2 x 10  90/90 isometric hold 3 x 10 sec hold  Therapeutic Activity: Deadlift 10 lbs multiple reps focusing on form Lifting 10 lb kettlebell from stool and placing on chair turning whole body to perform multiple reps Farmer's carry attempted d/c due to Lt knee pain   Self Care: Reviewed bending and lifting mechanics, educated on avoiding weighted twisting  Discussed limiting deep squatting and twisting/pivoting due to known meniscal injury  [facility] Adult PT Treatment:                                                DATE: 12/23/23  Self Care: Educated on proper lifting, bending mechanics, positioning for yard work and household cleaning/chores. Pushing preferable to pulling when possible. Utilized handout and demonstrated proper mechanics as needed.    [facility] Adult PT Treatment:                                                DATE: 12/17/23  Neuromuscular re-ed: Sidelying open book with core activation x 3 each Seated trunk rotation with core activation x 3 Supine TA march 2 x 10  90/90 hold unable due to weakness   Self Care: Body mechanics with seated activity Half kneel positioning to reduce stress on back with gardening- though patient unstable in this position secondary to weakness  Recommended use of gardening stool to reduce stress on her back Proper lifting mechanics and core activation with rotational movement to reduce stress on her back    [facility] Adult PT Treatment:                                                DATE: 12/15/23  Neuromuscular re-ed: Paloff press 2 x 10; red band  Supine posterior pelvic tilt x 5; 5 sec hold  Supine TA march 2 x 10  SLR with posterior pelvic tilt x 10 each  Supine bent knee fallout  x 10   Self Care: Recommended f/u with PCP regarding abdominal pain  Discussed bending and lifting mechanics with yard work Manufacturing engineer and returning Manufacturing engineer.   [facility] Adult PT Treatment:                                                 DATE: 11/28/23 Therapeutic Exercise: Seated piriformis stretch Seated figure 4 stretch Physioball roll outs all directions  Neuromuscular re-ed: Posterior pelvic tilt Posterior pelvic tilt with bent knee fall out Posterior pelvic tilt with march Bridge x 10 Bent over hip ext x 10 bilat Therapeutic Activity: Pull down red TB with march Pallof press red TB x 10 bilat Bow and arrow red TB    [facility] Adult PT Treatment:  DATE: 11/26/23  Neuromuscular re-ed: Posterior pelvic tilt Posterior pelvic tilt with bent knee fall out Posterior pelvic tilt with march Supine clam green TB 2 x 10 Therapeutic Activity: Standing hip hinge with cues for technique Pallof press red TB x 10 bilat Pull down red TB with marching Bow and arrow red TB   [facility] Adult PT Treatment:                                                DATE: 11/21/23  Neuromuscular re-ed: Supine posterior pelvic tilts x 10 Therapeutic Activity: Seated hip hinge with dowel x 10 Standing hip hinge holding dowel in front x 10  Standing hip hinge with foam roller at wall x 10   Self Care: Educated on posture and lifting mechanics and mechanics with household activities with handout provided.      PATIENT EDUCATION:  Education details: see treatment  Person educated: Patient Education method: Explanation, Demonstration, Tactile cues, Verbal cues Education comprehension: verbalized understanding, returned demonstration, verbal cues required, tactile cues required, and needs further education  HOME EXERCISE PROGRAM: Access Code: [email or web address] URL: [email or web address] Date: 11/26/2023 Prepared by: Darice Conine  Exercises - Supine Posterior Pelvic Tilt  - 1 x daily - 7 x weekly - 3 sets - 10 reps - 5 sec  hold - Hooklying Clamshell with Resistance  - 1 x daily - 7 x weekly - 3 sets - 10 reps - Supine March with Posterior Pelvic Tilt  - 1 x daily - 7 x weekly - 3  sets - 10 reps - Standing Anti-Rotation Press with Anchored Resistance  - 1 x daily - 7 x weekly - 2 sets - 10 reps  ASSESSMENT:  CLINICAL IMPRESSION: Focused on lifting activity today with good tolerance. Patient required heavy cues initially for proper deadlift form in order to allow for hip hinge and maintain weight close to her body. With continued practice and cueing she is able to properly perform. Incorporated lifting and carrying as patient continues to have tendency to rotate her trunk with lifting activity at home. She is able to properly perform without onset of pain. Attempted farmer's carry, though this caused left knee pain and was discontinued. Challenged with 90/90 core stabilization progression.   EVAL: Patient is a 63 y.o. female who was seen today for physical therapy evaluation and treatment for acute on chronic low back pain with recent exacerbation earlier this month attributed to gardening activity. Upon assessment she is noted to have good lumbar AROM with pain elicited with trunk flexion and lateral flexion. She has bilateral hip weakness and core weakness, aberrant lifting mechanics, and postural abnormalities. She will benefit from skilled PT to address the above stated deficits in order to optimize her function and assist in overall pain reduction.      GOALS: Goals reviewed with patient? Yes  SHORT TERM GOALS: Target date: 12/17/2023    Patient will be independent and compliant with initial HEP.   Baseline: see above Goal status: MET  2.  Patient will demonstrate proper lifting mechanics to reduce stress on her back.  Baseline: see above  12/17/23: cues required for proper hip/knee engagement 12/25/23: cues required for hip hinge  Goal status: ongoing   3.  Patient will demonstrate proper posture with seated activity on the floor to reduce stress on her back.  Baseline: demonstrates: long  sit with trunk flexed, or criss crossed with trunk flexed  12/17/23:  educated on neutral spine and core activation with seated positioning  Goal status: ongoing     LONG TERM GOALS: Target date: 01/17/24  Patient will demonstrate proper core activation with standing activity to reduce stress on her back with household tasks.  Baseline: poor core activation Goal status: INITIAL  2.  Patient will demonstrate 5/5 bilateral hip strength to improve stability about the chain with prolonged walking/standing activity.  Baseline: see above Goal status: INITIAL  3.  Patient will report pain at worst rated as </= 3/10 to reduce current functional limitations.  Baseline: 5 Goal status: INITIAL  4.  Patient will be independent with advanced home program to assist in management of her chronic condition.  Baseline: see above Goal status: INITIAL   PLAN:  PT FREQUENCY: 1-2x/week  PT DURATION: 8 weeks  PLANNED INTERVENTIONS: 97164- PT Re-evaluation, 97750- Physical Performance Testing, 97110-Therapeutic exercises, 97530- Therapeutic activity, [id number]- Neuromuscular re-education, 97535- Self Care, [id number]- Manual therapy, [id number]- Gait training, [phone]- Aquatic Therapy, [phone] (1-2 muscles), [id number] (3+ muscles)- Dry Needling, Cryotherapy, and Moist heat.  PLAN FOR NEXT SESSION: review and progress HEP prn.core stabilization. Lifting mechanics/education as needed.   Malita Ignasiak, PT, DPT, ATC 12/31/23 7:46 AM

## 2024-01-06 ENCOUNTER — Ambulatory Visit

## 2024-01-06 NOTE — Therapy (Unsigned)
 OUTPATIENT PHYSICAL THERAPY THORACOLUMBAR TREATMENT   Patient Name: Anne Mcdonald MRN: [id number] DOB:03-21-1961,63 y.o., female Today's Date: 01/07/2024   END OF SESSION:  PT End of Session - 01/07/24 1150     Visit Number 9    Number of Visits 17    Date for Recertification  01/17/24    Authorization Type [employer]    PT Start Time 1148    PT Stop Time 1228    PT Time Calculation (min) 40 min    Activity Tolerance Patient tolerated treatment well    Behavior During Therapy [facility] for tasks assessed/performed                   Past Medical History:  Diagnosis Date   Arthritis 2020   Fibroid uterus    GERD (gastroesophageal reflux disease)    PUD (peptic ulcer disease)    Past Surgical History:  Procedure Laterality Date   ABDOMINAL HYSTERECTOMY  2017   CESAREAN SECTION     CHOLECYSTECTOMY     TOTAL ABDOMINAL HYSTERECTOMY W/ BILATERAL SALPINGOOPHORECTOMY  08/2016   Patient Active Problem List   Diagnosis Date Noted   Right foot pain 10/31/2023   Lumbar degenerative disc disease 10/28/2023   Chest pain 05/12/2022   Left knee injury 06/15/2021   Metatarsalgia, left foot 08/02/2020   Memory impairment 02/14/2020   Hearing loss 12/10/2019   Irritable bowel syndrome with constipation 03/05/2019   RLS (restless legs syndrome) 07/15/2018   Seborrheic dermatitis 07/15/2018   Primary osteoarthritis of both hands 05/02/2017   Right carpal tunnel syndrome 05/02/2017   Dyshidrotic eczema 12/24/2016   Uterovaginal prolapse 08/27/2016   Bipolar 1 disorder (HCC) 08/02/2016   Moderate episode of recurrent major depressive disorder (HCC) 05/07/2016   Uterine fibroid 09/18/2015   Chronic migraine without aura without status migrainosus, not intractable 05/02/2015   Medication overuse headache 05/02/2015   Headache, menstrual migraine, intractable, with status migrainosus 12/09/2014   GERD (gastroesophageal reflux disease) 11/29/2014   Paroxysmal SVT  (supraventricular tachycardia) 06/10/2012   CIGARETTE SMOKER 08/24/2009   Migraine without aura 09/07/2008   HYPERTRIGLYCERIDEMIA 07/19/2008   Premenstrual tension syndrome 04/24/2006   HOT FLASHES 04/24/2006   Acute left-sided low back pain with left-sided sciatica 04/24/2006    PCP: Alvan Dorothyann BIRCH, MD   REFERRING PROVIDER: Alvan Dorothyann BIRCH, MD   REFERRING DIAG:  M54.50,G89.29 (ICD-10-CM) - Chronic midline low back pain without sciatica  R07.81 (ICD-10-CM) - Rib pain on left side  M54.6 (ICD-10-CM) - Acute midline thoracic back pain    Rationale for Evaluation and Treatment: Rehabilitation  THERAPY DIAG:  Other low back pain  Muscle weakness (generalized)  ONSET DATE: acute on chronic; recent exacerbation August 2025   SUBJECTIVE:  SUBJECTIVE STATEMENT: Pt still has a cold that's lasted 1.5 weeks, and is on antibiotics, and decongestant. She admits she was apprehensive about dancing at her event this weekend, however she was able to move how she wanted without adverse reactions.   EVAL: Patient reports her back pain started around 20 years when she herniated her disc and thinks it was from working out. Every few years she would hurt it again. Her most recent exacerbation of pain began at the beginning of August. She was doing a lot of yard work. She thinks it was from sitting pulling weeds where she was having to twist and turn a lot. She underwent physical therapy when she initially hurt her back, which was helpful. The pain is now intermittent about the lower back. Sometimes the pain will radiate down the posterior thighs bilaterally. She also reports a meniscus tear in the Lt knee that was treated conservatively, which will occasionally hurt after strenuous activity or twisting  activity. No numbness/tingling. No red flag symptoms.   PERTINENT HISTORY:  Hearing loss Hysterectomy  Per patient Lt meniscal tear   PAIN:  Are you having pain? No    PRECAUTIONS: None  WEIGHT BEARING RESTRICTIONS: No  FALLS:  Has patient fallen in last 6 months? No  LIVING ENVIRONMENT: Lives with: lives with their family Lives in: House/apartment Stairs: Yes: Internal: flight steps; on left going up and External: 4 steps; none Has following equipment at home: None  OCCUPATION: unemployed  PLOF: Independent  PATIENT GOALS: I want to be healed.    OBJECTIVE:  Note: Objective measures were completed at Evaluation unless otherwise noted.  DIAGNOSTIC FINDINGS:  Lumbar X-ray: IMPRESSION: 1. Similar mild L4-L5 and L5-S1 disc space narrowing. 2. Lower lumbar facet arthropathy.  PATIENT SURVEYS:  Modified Oswestry: 6% disability  COGNITION: Overall cognitive status: Within functional limits for tasks assessed     SENSATION: Not tested  MUSCLE LENGTH: Hamstrings: WNL bilaterally   POSTURE: decreased lumbar lordosis  PALPATION: [place]/TTP bilateral lumbar paraspinals   LUMBAR ROM:   AROM eval  Flexion Full (ache in low back)   Extension WNL   Right lateral flexion WNL LBP  Left lateral flexion WNL LBP  Right rotation WNL  Left rotation WNL   (Blank rows = not tested)  LOWER EXTREMITY ROM:     Active  Right eval Left eval  Hip flexion    Hip extension    Hip abduction    Hip adduction    Hip internal rotation    Hip external rotation    Knee flexion    Knee extension    Ankle dorsiflexion    Ankle plantarflexion    Ankle inversion    Ankle eversion     (Blank rows = not tested)  LOWER EXTREMITY MMT:    MMT Right eval Left eval  Hip flexion 4+ 4  Hip extension 4- 4-  Hip abduction 4 4-  Hip adduction    Hip internal rotation    Hip external rotation    Knee flexion    Knee extension    Ankle dorsiflexion    Ankle  plantarflexion    Ankle inversion    Ankle eversion     (Blank rows = not tested)  LUMBAR SPECIAL TESTS:  (-) SLR  FUNCTIONAL TESTS:  Squatting: excessive anterior tibial translation, trunk flexion, LOB   GAIT: Distance walked: 15 ft  Assistive device utilized: None Level of assistance: Complete Independence Comments: no obvious gait abnormalities   [facility]  Adult PT Treatment:                                                DATE: 01/07/24 Therapeutic Exercise: Low back walk outs with stability ball x10  Manual Therapy: STM to bil upper traps, Rhoms, Lev scap Neuromuscular re-ed: Supine posterior pelvic tilt 2x12 90/90 isometric hold 10 sec x5- explained how to keep arch of back flat on table  90/90 heel taps x10 each side alternating legs MELT Method using blue foam roller: Gliding superior/infer, Shearing side to side, Gliding to distal posterior gastroc to decrease neurofascial restriction  Self Care: Discussed stress effects on body health  [facility] Adult PT Treatment:                                                DATE: 12/25/23  Neuromuscular re-ed: Supine TA march 2 x 10  90/90 isometric hold 3 x 10 sec hold  Therapeutic Activity: Deadlift 10 lbs multiple reps focusing on form Lifting 10 lb kettlebell from stool and placing on chair turning whole body to perform multiple reps Farmer's carry attempted d/c due to Lt knee pain   Self Care: Reviewed bending and lifting mechanics, educated on avoiding weighted twisting  Discussed limiting deep squatting and twisting/pivoting due to known meniscal injury  [facility] Adult PT Treatment:                                                DATE: 12/23/23  Self Care: Educated on proper lifting, bending mechanics, positioning for yard work and household cleaning/chores. Pushing preferable to pulling when possible. Utilized handout and demonstrated proper mechanics as needed.    [facility] Adult PT Treatment:                                                 DATE: 12/17/23  Neuromuscular re-ed: Sidelying open book with core activation x 3 each Seated trunk rotation with core activation x 3 Supine TA march 2 x 10  90/90 hold unable due to weakness   Self Care: Body mechanics with seated activity Half kneel positioning to reduce stress on back with gardening- though patient unstable in this position secondary to weakness  Recommended use of gardening stool to reduce stress on her back Proper lifting mechanics and core activation with rotational movement to reduce stress on her back    [facility] Adult PT Treatment:                                                DATE: 12/15/23  Neuromuscular re-ed: Paloff press 2 x 10; red band  Supine posterior pelvic tilt x 5; 5 sec hold  Supine TA march 2 x 10  SLR with posterior pelvic tilt x 10 each  Supine bent knee fallout  x 10  Self Care: Recommended f/u with PCP regarding abdominal pain  Discussed bending and lifting mechanics with yard work Manufacturing engineer and returning Manufacturing engineer.   [facility] Adult PT Treatment:                                                DATE: 11/28/23 Therapeutic Exercise: Seated piriformis stretch Seated figure 4 stretch Physioball roll outs all directions  Neuromuscular re-ed: Posterior pelvic tilt Posterior pelvic tilt with bent knee fall out Posterior pelvic tilt with march Bridge x 10 Bent over hip ext x 10 bilat Therapeutic Activity: Pull down red TB with march Pallof press red TB x 10 bilat Bow and arrow red TB    [facility] Adult PT Treatment:                                                DATE: 11/26/23  Neuromuscular re-ed: Posterior pelvic tilt Posterior pelvic tilt with bent knee fall out Posterior pelvic tilt with march Supine clam green TB 2 x 10 Therapeutic Activity: Standing hip hinge with cues for technique Pallof press red TB x 10 bilat Pull down red TB with marching Bow and arrow red TB     PATIENT EDUCATION:  Education details: see treatment  Person  educated: Patient Education method: Explanation, Demonstration, Tactile cues, Verbal cues Education comprehension: verbalized understanding, returned demonstration, verbal cues required, tactile cues required, and needs further education  HOME EXERCISE PROGRAM: Access Code: [email or web address] URL: [email or web address] Date: 11/26/2023 Prepared by: Darice Conine  Exercises - Supine Posterior Pelvic Tilt  - 1 x daily - 7 x weekly - 3 sets - 10 reps - 5 sec  hold - Hooklying Clamshell with Resistance  - 1 x daily - 7 x weekly - 3 sets - 10 reps - Supine March with Posterior Pelvic Tilt  - 1 x daily - 7 x weekly - 3 sets - 10 reps - Standing Anti-Rotation Press with Anchored Resistance  - 1 x daily - 7 x weekly - 2 sets - 10 reps  ASSESSMENT:  CLINICAL IMPRESSION:   Pt missed last session due to sickness that is still lingering into today, is on antibiotics. Focused on core strengthening and stabilization, working on not arching the low back during LE dynamic motion. Pt required alternating one leg at a time to not compromise body mechanics. Was able to keep back flat for majority of the time, however the exercise irritated low back by end of core series. Discontinued core work and shifted to decreasing neurofascial tension in the lower legs and upper back, using a foam roller. Pt tolerated this very well, stating her low back pain had stopped.   EVAL: Patient is a 63 y.o. female who was seen today for physical therapy evaluation and treatment for acute on chronic low back pain with recent exacerbation earlier this month attributed to gardening activity. Upon assessment she is noted to have good lumbar AROM with pain elicited with trunk flexion and lateral flexion. She has bilateral hip weakness and core weakness, aberrant lifting mechanics, and postural abnormalities. She will benefit from skilled PT to address the above stated deficits in order to optimize her function and assist in  overall pain reduction.  GOALS: Goals reviewed with patient? Yes  SHORT TERM GOALS: Target date: 12/17/2023    Patient will be independent and compliant with initial HEP.   Baseline: see above Goal status: MET  2.  Patient will demonstrate proper lifting mechanics to reduce stress on her back.  Baseline: see above  12/17/23: cues required for proper hip/knee engagement 12/25/23: cues required for hip hinge  Goal status: ongoing   3.  Patient will demonstrate proper posture with seated activity on the floor to reduce stress on her back.  Baseline: demonstrates: long sit with trunk flexed, or criss crossed with trunk flexed  12/17/23: educated on neutral spine and core activation with seated positioning  Goal status: ongoing     LONG TERM GOALS: Target date: 01/17/24  Patient will demonstrate proper core activation with standing activity to reduce stress on her back with household tasks.  Baseline: poor core activation Goal status: INITIAL  2.  Patient will demonstrate 5/5 bilateral hip strength to improve stability about the chain with prolonged walking/standing activity.  Baseline: see above Goal status: INITIAL  3.  Patient will report pain at worst rated as </= 3/10 to reduce current functional limitations.  Baseline: 5 Goal status: INITIAL  4.  Patient will be independent with advanced home program to assist in management of her chronic condition.  Baseline: see above Goal status: INITIAL   PLAN:  PT FREQUENCY: 1-2x/week  PT DURATION: 8 weeks  PLANNED INTERVENTIONS: 97164- PT Re-evaluation, 97750- Physical Performance Testing, 97110-Therapeutic exercises, 97530- Therapeutic activity, [id number]- Neuromuscular re-education, 97535- Self Care, [id number]- Manual therapy, [id number]- Gait training, [phone]- Aquatic Therapy, [phone] (1-2 muscles), [id number] (3+ muscles)- Dry Needling, Cryotherapy, and Moist heat.  PLAN FOR NEXT SESSION: review and progress HEP prn.core stabilization.  Lifting mechanics/education as needed, back stretches, foam rolling MELT Method  Lavanda Cleverly, SPT 01/07/24 12:38 PM  Lucie Meeter, PT, DPT, ATC 01/07/24 1:23 PM

## 2024-01-07 ENCOUNTER — Ambulatory Visit

## 2024-01-07 DIAGNOSIS — M5459 Other low back pain: Secondary | ICD-10-CM

## 2024-01-07 DIAGNOSIS — M6281 Muscle weakness (generalized): Secondary | ICD-10-CM

## 2024-01-08 ENCOUNTER — Ambulatory Visit

## 2024-01-09 ENCOUNTER — Ambulatory Visit

## 2024-01-12 ENCOUNTER — Ambulatory Visit

## 2024-01-12 DIAGNOSIS — M5459 Other low back pain: Secondary | ICD-10-CM

## 2024-01-12 DIAGNOSIS — M6281 Muscle weakness (generalized): Secondary | ICD-10-CM

## 2024-01-12 NOTE — Therapy (Addendum)
 OUTPATIENT PHYSICAL THERAPY THORACOLUMBAR TREATMENT   Patient Name: Anne Mcdonald MRN: [id number] DOB:07/18/1960,63 y.o., female Today's Date: 01/12/2024   END OF SESSION:  PT End of Session - 01/12/24 1146     Visit Number 10    Number of Visits 17    Date for Recertification  01/17/24    Authorization Type [employer]    PT Start Time 1146    PT Stop Time 1227    PT Time Calculation (min) 41 min    Activity Tolerance Patient tolerated treatment well    Behavior During Therapy [facility] for tasks assessed/performed                   Past Medical History:  Diagnosis Date   Arthritis 2020   Fibroid uterus    GERD (gastroesophageal reflux disease)    PUD (peptic ulcer disease)    Past Surgical History:  Procedure Laterality Date   ABDOMINAL HYSTERECTOMY  2017   CESAREAN SECTION     CHOLECYSTECTOMY     TOTAL ABDOMINAL HYSTERECTOMY W/ BILATERAL SALPINGOOPHORECTOMY  08/2016   Patient Active Problem List   Diagnosis Date Noted   Right foot pain 10/31/2023   Lumbar degenerative disc disease 10/28/2023   Chest pain 05/12/2022   Left knee injury 06/15/2021   Metatarsalgia, left foot 08/02/2020   Memory impairment 02/14/2020   Hearing loss 12/10/2019   Irritable bowel syndrome with constipation 03/05/2019   RLS (restless legs syndrome) 07/15/2018   Seborrheic dermatitis 07/15/2018   Primary osteoarthritis of both hands 05/02/2017   Right carpal tunnel syndrome 05/02/2017   Dyshidrotic eczema 12/24/2016   Uterovaginal prolapse 08/27/2016   Bipolar 1 disorder (HCC) 08/02/2016   Moderate episode of recurrent major depressive disorder (HCC) 05/07/2016   Uterine fibroid 09/18/2015   Chronic migraine without aura without status migrainosus, not intractable 05/02/2015   Medication overuse headache 05/02/2015   Headache, menstrual migraine, intractable, with status migrainosus 12/09/2014   GERD (gastroesophageal reflux disease) 11/29/2014   Paroxysmal SVT  (supraventricular tachycardia) 06/10/2012   CIGARETTE SMOKER 08/24/2009   Migraine without aura 09/07/2008   HYPERTRIGLYCERIDEMIA 07/19/2008   Premenstrual tension syndrome 04/24/2006   HOT FLASHES 04/24/2006   Acute left-sided low back pain with left-sided sciatica 04/24/2006    PCP: Alvan Dorothyann BIRCH, MD   REFERRING PROVIDER: Alvan Dorothyann BIRCH, MD   REFERRING DIAG:  M54.50,G89.29 (ICD-10-CM) - Chronic midline low back pain without sciatica  R07.81 (ICD-10-CM) - Rib pain on left side  M54.6 (ICD-10-CM) - Acute midline thoracic back pain    Rationale for Evaluation and Treatment: Rehabilitation  THERAPY DIAG:  Muscle weakness (generalized)  Other low back pain  ONSET DATE: acute on chronic; recent exacerbation August 2025   SUBJECTIVE:  SUBJECTIVE STATEMENT: Pt reports no back pain today, just dealing with a headache she is currently taking meds for.   EVAL: Patient reports her back pain started around 20 years when she herniated her disc and thinks it was from working out. Every few years she would hurt it again. Her most recent exacerbation of pain began at the beginning of August. She was doing a lot of yard work. She thinks it was from sitting pulling weeds where she was having to twist and turn a lot. She underwent physical therapy when she initially hurt her back, which was helpful. The pain is now intermittent about the lower back. Sometimes the pain will radiate down the posterior thighs bilaterally. She also reports a meniscus tear in the Lt knee that was treated conservatively, which will occasionally hurt after strenuous activity or twisting activity. No numbness/tingling. No red flag symptoms.   PERTINENT HISTORY:  Hearing loss Hysterectomy  Per patient Lt meniscal tear    PAIN:  Are you having pain? No    PRECAUTIONS: None  WEIGHT BEARING RESTRICTIONS: No  FALLS:  Has patient fallen in last 6 months? No  LIVING ENVIRONMENT: Lives with: lives with their family Lives in: House/apartment Stairs: Yes: Internal: flight steps; on left going up and External: 4 steps; none Has following equipment at home: None  OCCUPATION: unemployed  PLOF: Independent  PATIENT GOALS: I want to be healed.    OBJECTIVE:  Note: Objective measures were completed at Evaluation unless otherwise noted.  DIAGNOSTIC FINDINGS:  Lumbar X-ray: IMPRESSION: 1. Similar mild L4-L5 and L5-S1 disc space narrowing. 2. Lower lumbar facet arthropathy.  PATIENT SURVEYS:  Modified Oswestry: 6% disability  COGNITION: Overall cognitive status: Within functional limits for tasks assessed     SENSATION: Not tested  MUSCLE LENGTH: Hamstrings: WNL bilaterally   POSTURE: decreased lumbar lordosis  PALPATION: [place]autness/TTP bilateral lumbar paraspinals   LUMBAR ROM:   AROM eval  Flexion Full (ache in low back)   Extension WNL   Right lateral flexion WNL LBP  Left lateral flexion WNL LBP  Right rotation WNL  Left rotation WNL   (Blank rows = not tested)  LOWER EXTREMITY ROM:     Active  Right eval Left eval  Hip flexion    Hip extension    Hip abduction    Hip adduction    Hip internal rotation    Hip external rotation    Knee flexion    Knee extension    Ankle dorsiflexion    Ankle plantarflexion    Ankle inversion    Ankle eversion     (Blank rows = not tested)  LOWER EXTREMITY MMT:    MMT Right eval Left eval  Hip flexion 4+ 4  Hip extension 4- 4-  Hip abduction 4 4-  Hip adduction    Hip internal rotation    Hip external rotation    Knee flexion    Knee extension    Ankle dorsiflexion    Ankle plantarflexion    Ankle inversion    Ankle eversion     (Blank rows = not tested)  LUMBAR SPECIAL TESTS:  (-) SLR  FUNCTIONAL TESTS:   Squatting: excessive anterior tibial translation, trunk flexion, LOB   GAIT: Distance walked: 15 ft  Assistive device utilized: None Level of assistance: Complete Independence Comments: no obvious gait abnormalities  [facility] Adult PT Treatment:  DATE: 01/12/24 Therapeutic Exercise: Stability ball walk outs for back stretch x10  Reviewed HEP  Manual Therapy: Suboccipital release  STM to bil upper traps, lev scap, suboccipitals  Therapeutic Activity: Dead lifting from stool with 5# KB x10- patient education on proper body mechanics at knee/hip joint  Dead lifting from stool with 5# KB several times targetting hip hinging using mirror therapy  Re-assessed STGs educating patient on overall progress as it relates to her functional abilities.     [facility] Adult PT Treatment:                                                DATE: 01/07/24 Therapeutic Exercise: Low back walk outs with stability ball x10  Manual Therapy: STM to bil upper traps, Rhoms, Lev scap Neuromuscular re-ed: Supine posterior pelvic tilt 2x12 90/90 isometric hold 10 sec x5- explained how to keep arch of back flat on table  90/90 heel taps x10 each side alternating legs MELT Method using blue foam roller: Gliding superior/infer, Shearing side to side, Gliding to distal posterior gastroc to decrease neurofascial restriction  Self Care: Discussed stress effects on body health  [facility] Adult PT Treatment:                                                DATE: 12/25/23  Neuromuscular re-ed: Supine TA march 2 x 10  90/90 isometric hold 3 x 10 sec hold  Therapeutic Activity: Deadlift 10 lbs multiple reps focusing on form Lifting 10 lb kettlebell from stool and placing on chair turning whole body to perform multiple reps Farmer's carry attempted d/c due to Lt knee pain   Self Care: Reviewed bending and lifting mechanics, educated on avoiding weighted twisting  Discussed limiting  deep squatting and twisting/pivoting due to known meniscal injury  [facility] Adult PT Treatment:                                                DATE: 12/23/23  Self Care: Educated on proper lifting, bending mechanics, positioning for yard work and household cleaning/chores. Pushing preferable to pulling when possible. Utilized handout and demonstrated proper mechanics as needed.    [facility] Adult PT Treatment:                                                DATE: 12/17/23  Neuromuscular re-ed: Sidelying open book with core activation x 3 each Seated trunk rotation with core activation x 3 Supine TA march 2 x 10  90/90 hold unable due to weakness   Self Care: Body mechanics with seated activity Half kneel positioning to reduce stress on back with gardening- though patient unstable in this position secondary to weakness  Recommended use of gardening stool to reduce stress on her back Proper lifting mechanics and core activation with rotational movement to reduce stress on her back    [facility] Adult PT Treatment:  DATE: 12/15/23  Neuromuscular re-ed: Paloff press 2 x 10; red band  Supine posterior pelvic tilt x 5; 5 sec hold  Supine TA march 2 x 10  SLR with posterior pelvic tilt x 10 each  Supine bent knee fallout  x 10   Self Care: Recommended f/u with PCP regarding abdominal pain  Discussed bending and lifting mechanics with yard work Manufacturing engineer and returning Manufacturing engineer.   [facility] Adult PT Treatment:                                                DATE: 11/28/23 Therapeutic Exercise: Seated piriformis stretch Seated figure 4 stretch Physioball roll outs all directions  Neuromuscular re-ed: Posterior pelvic tilt Posterior pelvic tilt with bent knee fall out Posterior pelvic tilt with march Bridge x 10 Bent over hip ext x 10 bilat Therapeutic Activity: Pull down red TB with march Pallof press red TB x 10 bilat Bow and arrow red TB       PATIENT  EDUCATION:  Education details: see treatment  Person educated: Patient Education method: Explanation, Demonstration, Tactile cues, Verbal cues Education comprehension: verbalized understanding, returned demonstration, verbal cues required, tactile cues required, and needs further education  HOME EXERCISE PROGRAM: Access Code: [email or web address] URL: [email or web address] Date: 11/26/2023 Prepared by: Darice Conine  Exercises - Supine Posterior Pelvic Tilt  - 1 x daily - 7 x weekly - 3 sets - 10 reps - 5 sec  hold - Hooklying Clamshell with Resistance  - 1 x daily - 7 x weekly - 3 sets - 10 reps - Supine March with Posterior Pelvic Tilt  - 1 x daily - 7 x weekly - 3 sets - 10 reps - Standing Anti-Rotation Press with Anchored Resistance  - 1 x daily - 7 x weekly - 2 sets - 10 reps  ASSESSMENT:  CLINICAL IMPRESSION:     Pt reports feeling much better since starting PT,she reports no back pain. She states doing her HEP consistently, and can tell a difference in her everyday body mechanics based on the education she's received from PT. Pt reports having a mild headache today that was addressed with suboccipital release, she reports helped alleviate the pain by end of session. Tolerated well proper body mechanics during dead lifts, requiring several verbal cues to increase hip hinging ROM during descent. Pt reported no adverse reactions by end of session, also stating her headache dissipated.   EVAL: Patient is a 63 y.o. female who was seen today for physical therapy evaluation and treatment for acute on chronic low back pain with recent exacerbation earlier this month attributed to gardening activity. Upon assessment she is noted to have good lumbar AROM with pain elicited with trunk flexion and lateral flexion. She has bilateral hip weakness and core weakness, aberrant lifting mechanics, and postural abnormalities. She will benefit from skilled PT to address the above stated deficits in  order to optimize her function and assist in overall pain reduction.      GOALS: Goals reviewed with patient? Yes  SHORT TERM GOALS: Target date: 12/17/2023    Patient will be independent and compliant with initial HEP.   Baseline: see above Goal status: MET  2.  Patient will demonstrate proper lifting mechanics to reduce stress on her back.  Baseline: see above  12/17/23: cues required for proper hip/knee engagement 12/25/23: cues required for  hip hinge  01/12/24: proper lift with cues for hip hinge  Goal status: ongoing    3.  Patient will demonstrate proper posture with seated activity on the floor to reduce stress on her back.  Baseline: demonstrates: long sit with trunk flexed, or criss crossed with trunk flexed  12/17/23: educated on neutral spine and core activation with seated positioning  Goal status: MET on 01/12/24    LONG TERM GOALS: Target date: 01/17/24  Patient will demonstrate proper core activation with standing activity to reduce stress on her back with household tasks.  Baseline: poor core activation Goal status: INITIAL  2.  Patient will demonstrate 5/5 bilateral hip strength to improve stability about the chain with prolonged walking/standing activity.  Baseline: see above Goal status: INITIAL  3.  Patient will report pain at worst rated as </= 3/10 to reduce current functional limitations.  Baseline: 5 Goal status: INITIAL  4.  Patient will be independent with advanced home program to assist in management of her chronic condition.  Baseline: see above Goal status: INITIAL   PLAN:  PT FREQUENCY: 1-2x/week  PT DURATION: 8 weeks  PLANNED INTERVENTIONS: 97164- PT Re-evaluation, 97750- Physical Performance Testing, 97110-Therapeutic exercises, 97530- Therapeutic activity, [id number]- Neuromuscular re-education, 97535- Self Care, [id number]- Manual therapy, [id number]- Gait training, [phone]- Aquatic Therapy, [phone] (1-2 muscles), [id number] (3+ muscles)- Dry Needling,  Cryotherapy, and Moist heat.  PLAN FOR NEXT SESSION: review and progress HEP prn.core stabilization. Lifting mechanics/education as needed, back stretches, foam rolling MELT Method,manual therapy as needed, RE-CERT   Lavanda Cleverly, SPT 01/12/24 4:16 PM  Lucie Meeter, PT, DPT, ATC 01/12/24 4:16 PM

## 2024-01-14 ENCOUNTER — Ambulatory Visit

## 2024-01-14 DIAGNOSIS — M5459 Other low back pain: Secondary | ICD-10-CM | POA: Diagnosis not present

## 2024-01-14 DIAGNOSIS — M6281 Muscle weakness (generalized): Secondary | ICD-10-CM

## 2024-01-14 NOTE — Therapy (Signed)
 OUTPATIENT PHYSICAL THERAPY THORACOLUMBAR TREATMENT RE-CERTIFICATION   Patient Name: Anne Mcdonald MRN: [id number] DOB:05-Mar-1961,63 y.o., female Today's Date: 01/14/2024   END OF SESSION:  PT End of Session - 01/14/24 1102     Visit Number 11    Number of Visits 17    Date for Recertification  02/28/24    Authorization Type [employer]    PT Start Time 1102    PT Stop Time 1143    PT Time Calculation (min) 41 min    Activity Tolerance Patient tolerated treatment well    Behavior During Therapy [facility] for tasks assessed/performed                    Past Medical History:  Diagnosis Date   Arthritis 2020   Fibroid uterus    GERD (gastroesophageal reflux disease)    PUD (peptic ulcer disease)    Past Surgical History:  Procedure Laterality Date   ABDOMINAL HYSTERECTOMY  2017   CESAREAN SECTION     CHOLECYSTECTOMY     TOTAL ABDOMINAL HYSTERECTOMY W/ BILATERAL SALPINGOOPHORECTOMY  08/2016   Patient Active Problem List   Diagnosis Date Noted   Right foot pain 10/31/2023   Lumbar degenerative disc disease 10/28/2023   Chest pain 05/12/2022   Left knee injury 06/15/2021   Metatarsalgia, left foot 08/02/2020   Memory impairment 02/14/2020   Hearing loss 12/10/2019   Irritable bowel syndrome with constipation 03/05/2019   RLS (restless legs syndrome) 07/15/2018   Seborrheic dermatitis 07/15/2018   Primary osteoarthritis of both hands 05/02/2017   Right carpal tunnel syndrome 05/02/2017   Dyshidrotic eczema 12/24/2016   Uterovaginal prolapse 08/27/2016   Bipolar 1 disorder (HCC) 08/02/2016   Moderate episode of recurrent major depressive disorder (HCC) 05/07/2016   Uterine fibroid 09/18/2015   Chronic migraine without aura without status migrainosus, not intractable 05/02/2015   Medication overuse headache 05/02/2015   Headache, menstrual migraine, intractable, with status migrainosus 12/09/2014   GERD (gastroesophageal reflux disease) 11/29/2014    Paroxysmal SVT (supraventricular tachycardia) 06/10/2012   CIGARETTE SMOKER 08/24/2009   Migraine without aura 09/07/2008   HYPERTRIGLYCERIDEMIA 07/19/2008   Premenstrual tension syndrome 04/24/2006   HOT FLASHES 04/24/2006   Acute left-sided low back pain with left-sided sciatica 04/24/2006    PCP: Alvan Dorothyann BIRCH, MD   REFERRING PROVIDER: Alvan Dorothyann BIRCH, MD   REFERRING DIAG:  M54.50,G89.29 (ICD-10-CM) - Chronic midline low back pain without sciatica  R07.81 (ICD-10-CM) - Rib pain on left side  M54.6 (ICD-10-CM) - Acute midline thoracic back pain    Rationale for Evaluation and Treatment: Rehabilitation  THERAPY DIAG:  Muscle weakness (generalized)  Other low back pain  ONSET DATE: acute on chronic; recent exacerbation August 2025   SUBJECTIVE:  SUBJECTIVE STATEMENT: Patient reports the back was a little sore last night from yard work. Feels good today.   EVAL: Patient reports her back pain started around 20 years when she herniated her disc and thinks it was from working out. Every few years she would hurt it again. Her most recent exacerbation of pain began at the beginning of August. She was doing a lot of yard work. She thinks it was from sitting pulling weeds where she was having to twist and turn a lot. She underwent physical therapy when she initially hurt her back, which was helpful. The pain is now intermittent about the lower back. Sometimes the pain will radiate down the posterior thighs bilaterally. She also reports a meniscus tear in the Lt knee that was treated conservatively, which will occasionally hurt after strenuous activity or twisting activity. No numbness/tingling. No red flag symptoms.   PERTINENT HISTORY:  Hearing loss Hysterectomy  Per patient Lt meniscal  tear   PAIN:  Are you having pain? No    PRECAUTIONS: None  WEIGHT BEARING RESTRICTIONS: No  FALLS:  Has patient fallen in last 6 months? No  LIVING ENVIRONMENT: Lives with: lives with their family Lives in: House/apartment Stairs: Yes: Internal: flight steps; on left going up and External: 4 steps; none Has following equipment at home: None  OCCUPATION: unemployed  PLOF: Independent  PATIENT GOALS: I want to be healed.    OBJECTIVE:  Note: Objective measures were completed at Evaluation unless otherwise noted.  DIAGNOSTIC FINDINGS:  Lumbar X-ray: IMPRESSION: 1. Similar mild L4-L5 and L5-S1 disc space narrowing. 2. Lower lumbar facet arthropathy.  PATIENT SURVEYS:  Modified Oswestry: 6% disability  COGNITION: Overall cognitive status: Within functional limits for tasks assessed     SENSATION: Not tested  MUSCLE LENGTH: Hamstrings: WNL bilaterally   POSTURE: decreased lumbar lordosis  PALPATION: [place]/TTP bilateral lumbar paraspinals   LUMBAR ROM:   AROM eval  Flexion Full (ache in low back)   Extension WNL   Right lateral flexion WNL LBP  Left lateral flexion WNL LBP  Right rotation WNL  Left rotation WNL   (Blank rows = not tested)  LOWER EXTREMITY ROM:     Active  Right eval Left eval  Hip flexion    Hip extension    Hip abduction    Hip adduction    Hip internal rotation    Hip external rotation    Knee flexion    Knee extension    Ankle dorsiflexion    Ankle plantarflexion    Ankle inversion    Ankle eversion     (Blank rows = not tested)  LOWER EXTREMITY MMT:    MMT Right eval Left eval 01/14/24  Hip flexion 4+ 4 5 bilateral   Hip extension 4- 4- 5 bilateral   Hip abduction 4 4- 4+ Lt; 4+ Rt   Hip adduction     Hip internal rotation     Hip external rotation     Knee flexion     Knee extension     Ankle dorsiflexion     Ankle plantarflexion     Ankle inversion     Ankle eversion      (Blank rows = not  tested)  LUMBAR SPECIAL TESTS:  (-) SLR  FUNCTIONAL TESTS:  Squatting: excessive anterior tibial translation, trunk flexion, LOB   GAIT: Distance walked: 15 ft  Assistive device utilized: None Level of assistance: Complete Independence Comments: no obvious gait abnormalities  [facility] Adult PT Treatment:  DATE: 01/14/24 Therapeutic Exercise: HEP update   Therapeutic Activity: Re-assessment to determine overall progress, educating patient on progress towards goals  Hip hinge x 5 in standing Med ball toss from hinge positioning 6 lbs x 5 Kettlebell swing multiple reps working on form; 5 lbs   Self Care: Recommendation for gardening device to pick up acorns Reviewed seated positioning for gardening and proper core activation    [facility] Adult PT Treatment:                                                DATE: 01/12/24 Therapeutic Exercise: Stability ball walk outs for back stretch x10  Reviewed HEP  Manual Therapy: Suboccipital release  STM to bil upper traps, lev scap, suboccipitals  Therapeutic Activity: Dead lifting from stool with 5# KB x10- patient education on proper body mechanics at knee/hip joint  Dead lifting from stool with 5# KB several times targetting hip hinging using mirror therapy  Re-assessed STGs educating patient on overall progress as it relates to her functional abilities.     [facility] Adult PT Treatment:                                                DATE: 01/07/24 Therapeutic Exercise: Low back walk outs with stability ball x10  Manual Therapy: STM to bil upper traps, Rhoms, Lev scap Neuromuscular re-ed: Supine posterior pelvic tilt 2x12 90/90 isometric hold 10 sec x5- explained how to keep arch of back flat on table  90/90 heel taps x10 each side alternating legs MELT Method using blue foam roller: Gliding superior/infer, Shearing side to side, Gliding to distal posterior gastroc to decrease neurofascial  restriction  Self Care: Discussed stress effects on body health  [facility] Adult PT Treatment:                                                DATE: 12/25/23  Neuromuscular re-ed: Supine TA march 2 x 10  90/90 isometric hold 3 x 10 sec hold  Therapeutic Activity: Deadlift 10 lbs multiple reps focusing on form Lifting 10 lb kettlebell from stool and placing on chair turning whole body to perform multiple reps Farmer's carry attempted d/c due to Lt knee pain   Self Care: Reviewed bending and lifting mechanics, educated on avoiding weighted twisting  Discussed limiting deep squatting and twisting/pivoting due to known meniscal injury  [facility] Adult PT Treatment:                                                DATE: 12/23/23  Self Care: Educated on proper lifting, bending mechanics, positioning for yard work and household cleaning/chores. Pushing preferable to pulling when possible. Utilized handout and demonstrated proper mechanics as needed.    [facility] Adult PT Treatment:  DATE: 12/17/23  Neuromuscular re-ed: Sidelying open book with core activation x 3 each Seated trunk rotation with core activation x 3 Supine TA march 2 x 10  90/90 hold unable due to weakness   Self Care: Body mechanics with seated activity Half kneel positioning to reduce stress on back with gardening- though patient unstable in this position secondary to weakness  Recommended use of gardening stool to reduce stress on her back Proper lifting mechanics and core activation with rotational movement to reduce stress on her back      PATIENT EDUCATION:  Education details: see treatment  Person educated: Patient Education method: Explanation, Demonstration, Tactile cues, Verbal cues, handout  Education comprehension: verbalized understanding, returned demonstration, verbal cues required, tactile cues required, and needs further education  HOME EXERCISE PROGRAM: Access Code:  [email or web address] URL: [email or web address] Date: 01/14/2024 Prepared by: Lucie Meeter  Exercises - Supine Posterior Pelvic Tilt  - 1 x daily - 7 x weekly - 3 sets - 10 reps - 5 sec  hold - Hooklying Clamshell with Resistance  - 1 x daily - 7 x weekly - 3 sets - 10 reps - Supine March with Posterior Pelvic Tilt  - 1 x daily - 7 x weekly - 3 sets - 10 reps - Standing Anti-Rotation Press with Anchored Resistance  - 1 x daily - 7 x weekly - 2 sets - 10 reps - Standing Hip Hinge  - 1 x daily - 7 x weekly - 2 sets - 10 reps  ASSESSMENT:  CLINICAL IMPRESSION: Viveca is making steady progress in PT for her low back pain. She reports an overall improvement in her back pain. She demonstrates improvements in strength and body mechanics. She is requiring less cueing for proper body mechanics with lifting activity, though requires continued emphasis on progressive loading of lifting activity. She demonstrates proper core activation with supine activity, but requires progression into standing/functional core stabilization. She will benefit from continued skilled PT 1 x week for 6 additional weeks to fully integrate dynamic core stabilization and functional lifting in order to optimize her function.   EVAL: Patient is a 63 y.o. female who was seen today for physical therapy evaluation and treatment for acute on chronic low back pain with recent exacerbation earlier this month attributed to gardening activity. Upon assessment she is noted to have good lumbar AROM with pain elicited with trunk flexion and lateral flexion. She has bilateral hip weakness and core weakness, aberrant lifting mechanics, and postural abnormalities. She will benefit from skilled PT to address the above stated deficits in order to optimize her function and assist in overall pain reduction.      GOALS: Goals reviewed with patient? Yes  SHORT TERM GOALS: Target date: 12/17/2023    Patient will be independent and compliant  with initial HEP.   Baseline: see above Goal status: MET  2.  Patient will demonstrate proper lifting mechanics to reduce stress on her back.  Baseline: see above  12/17/23: cues required for proper hip/knee engagement 12/25/23: cues required for hip hinge  01/12/24: proper lift with cues for hip hinge  01/14/24: cues required for hip hinge, foot positioning  Goal status: ongoing    3.  Patient will demonstrate proper posture with seated activity on the floor to reduce stress on her back.  Baseline: demonstrates: long sit with trunk flexed, or criss crossed with trunk flexed  12/17/23: educated on neutral spine and core activation with seated positioning  Goal status: MET on 01/12/24  LONG TERM GOALS: Target date: 02/28/24  Patient will demonstrate proper core activation with standing activity to reduce stress on her back with household tasks.  Baseline: poor core activation 01/14/24: remains challenged with dynamic supine core stabilization  Goal status: progressing   2.  Patient will demonstrate 5/5 bilateral hip strength to improve stability about the chain with prolonged walking/standing activity.  Baseline: see above Goal status: partially met    3.  Patient will report pain at worst rated as </= 3/10 to reduce current functional limitations.  Baseline: 5 01/14/24: 1 soreness at worst  Goal status: MET  4.  Patient will be independent with advanced home program to assist in management of her chronic condition.  Baseline: see above Goal status: progressing    PLAN:  PT FREQUENCY: 1x/week  PT DURATION: 6 weeks  PLANNED INTERVENTIONS: 97164- PT Re-evaluation, 97750- Physical Performance Testing, 97110-Therapeutic exercises, 97530- Therapeutic activity, [id number]- Neuromuscular re-education, 97535- Self Care, [id number]- Manual therapy, [id number]- Gait training, [phone]- Aquatic Therapy, [phone] (1-2 muscles), 20561 (3+ muscles)- Dry Needling, Cryotherapy, and Moist heat.  PLAN FOR  NEXT SESSION: review and progress HEP prn.core stabilization. Progress hip hinge and kettlebell swings. Standing core strengthening (rotation)   Lucie Meeter, PT, DPT, ATC 01/14/24 11:49 AM

## 2024-01-15 ENCOUNTER — Ambulatory Visit: Admitting: Family Medicine

## 2024-01-20 ENCOUNTER — Ambulatory Visit: Payer: Self-pay

## 2024-01-20 NOTE — Telephone Encounter (Signed)
 FYI Only or Action Required?: FYI only for provider.  Patient was last seen in primary care on 12/31/2023 by Anne Dorothyann BIRCH, MD.  Called Nurse Triage reporting Abdominal Pain.  Symptoms began years ago but worse in the past 1-2 months per patient.  Interventions attempted: Rest, hydration, or home remedies, Dietary changes, and Other: tried new IBS prescription but stopped after one day.  Symptoms are: gradually worsening.  Triage Disposition: See PCP Within 2 Weeks  Patient/caregiver understands and will follow disposition?: Yes                  Copied from CRM [id number]. Topic: Clinical - Red Word Triage >> Jan 20, 2024  1:35 PM Aleatha C wrote: Red Word that prompted transfer to Nurse Triage: Stomach issues, feels sick to her stomach came in to see the doctor a few weeks ago and its getting worst, when she goes to the bathroom her stomach hurts bad Reason for Disposition  Abdominal pain is a chronic symptom (recurrent or ongoing AND present > 4 weeks)  Answer Assessment - Initial Assessment Questions Patient saw her PCP Dr Anne on 12/31/2023 & states she is not better PCP started patient on a new medication for IBS--lubiprostone ---patient only took this one time and stated that she had bad side effects/a bad reaction to it. She states that she felt like she had a hard time breathing but she states that she also had a cold at that time. Patient states that her GI issues are getting worse and she wanted to see her PCP again Patient wanted to wait until Thursday 01/22/2024 to see Dr Anne  Patient was concerned about recent weight loss and hardly being able to eat She denies any vomiting blood or blood in her stools  Patient is advised to call us  back if anything changes or with any further questions/concerns. Patient is advised that if anything worsens to go to the Emergency Room. Patient verbalized understanding.    1. LOCATION: Where does it hurt?       Upper abdomen 2. RADIATION: Does the pain shoot anywhere else? (e.g., chest, back)     denies 3. ONSET: When did the pain begin? (e.g., minutes, hours or days ago)      Years ago---worse in the past month or two 4. SUDDEN: Gradual or sudden onset?     ---- 5. PATTERN Does the pain come and go, or is it constant?     Comes and goes 6. SEVERITY: How bad is the pain?  (e.g., Scale 1-10; mild, moderate, or severe)     8 7. RECURRENT SYMPTOM: Have you ever had this type of stomach pain before? If Yes, ask: When was the last time? and What happened that time?      Yes--- IBS 8. CAUSE: What do you think is causing the stomach pain? (e.g., gallstones, recent abdominal surgery)     Not sure 9. RELIEVING/AGGRAVATING FACTORS: What makes it better or worse? (e.g., antacids, bending or twisting motion, bowel movement)     ------- 10. OTHER SYMPTOMS: Do you have any other symptoms? (e.g., back pain, diarrhea, fever, urination pain, vomiting)       Patient denies  Protocols used: Abdominal Pain - [facility] [place]

## 2024-01-21 ENCOUNTER — Ambulatory Visit: Payer: Self-pay

## 2024-01-21 DIAGNOSIS — M6281 Muscle weakness (generalized): Secondary | ICD-10-CM

## 2024-01-21 DIAGNOSIS — M5459 Other low back pain: Secondary | ICD-10-CM | POA: Diagnosis not present

## 2024-01-21 NOTE — Therapy (Signed)
 OUTPATIENT PHYSICAL THERAPY THORACOLUMBAR TREATMENT    Patient Name: Anne Mcdonald MRN: [id number] DOB:07-02-1960,63 y.o., female Today's Date: 01/21/2024   END OF SESSION:  PT End of Session - 01/21/24 1058     Visit Number 12    Number of Visits 17    Date for Recertification  02/28/24    Authorization Type [employer]    PT Start Time 1100    PT Stop Time 1145    PT Time Calculation (min) 45 min    Activity Tolerance Patient tolerated treatment well    Behavior During Therapy [facility] for tasks assessed/performed                     Past Medical History:  Diagnosis Date   Arthritis 2020   Fibroid uterus    GERD (gastroesophageal reflux disease)    PUD (peptic ulcer disease)    Past Surgical History:  Procedure Laterality Date   ABDOMINAL HYSTERECTOMY  2017   CESAREAN SECTION     CHOLECYSTECTOMY     TOTAL ABDOMINAL HYSTERECTOMY W/ BILATERAL SALPINGOOPHORECTOMY  08/2016   Patient Active Problem List   Diagnosis Date Noted   Right foot pain 10/31/2023   Lumbar degenerative disc disease 10/28/2023   Chest pain 05/12/2022   Left knee injury 06/15/2021   Metatarsalgia, left foot 08/02/2020   Memory impairment 02/14/2020   Hearing loss 12/10/2019   Irritable bowel syndrome with constipation 03/05/2019   RLS (restless legs syndrome) 07/15/2018   Seborrheic dermatitis 07/15/2018   Primary osteoarthritis of both hands 05/02/2017   Right carpal tunnel syndrome 05/02/2017   Dyshidrotic eczema 12/24/2016   Uterovaginal prolapse 08/27/2016   Bipolar 1 disorder (HCC) 08/02/2016   Moderate episode of recurrent major depressive disorder (HCC) 05/07/2016   Uterine fibroid 09/18/2015   Chronic migraine without aura without status migrainosus, not intractable 05/02/2015   Medication overuse headache 05/02/2015   Headache, menstrual migraine, intractable, with status migrainosus 12/09/2014   GERD (gastroesophageal reflux disease) 11/29/2014   Paroxysmal SVT  (supraventricular tachycardia) 06/10/2012   CIGARETTE SMOKER 08/24/2009   Migraine without aura 09/07/2008   HYPERTRIGLYCERIDEMIA 07/19/2008   Premenstrual tension syndrome 04/24/2006   HOT FLASHES 04/24/2006   Acute left-sided low back pain with left-sided sciatica 04/24/2006    PCP: Alvan Dorothyann BIRCH, MD   REFERRING PROVIDER: Alvan Dorothyann BIRCH, MD   REFERRING DIAG:  M54.50,G89.29 (ICD-10-CM) - Chronic midline low back pain without sciatica  R07.81 (ICD-10-CM) - Rib pain on left side  M54.6 (ICD-10-CM) - Acute midline thoracic back pain    Rationale for Evaluation and Treatment: Rehabilitation  THERAPY DIAG:  Muscle weakness (generalized)  Other low back pain  ONSET DATE: acute on chronic; recent exacerbation August 2025   SUBJECTIVE:  SUBJECTIVE STATEMENT: Patient reports her back is fine, but she is still having a lot of GI issues and has f/u with Dr. Alvan tomorrow. She continues to have cold symptoms. Reports difficulty concentrating. Has been feeling light-headed and brain fog today, but has not eaten anything yet today.   EVAL: Patient reports her back pain started around 20 years when she herniated her disc and thinks it was from working out. Every few years she would hurt it again. Her most recent exacerbation of pain began at the beginning of August. She was doing a lot of yard work. She thinks it was from sitting pulling weeds where she was having to twist and turn a lot. She underwent physical therapy when she initially hurt her back, which was helpful. The pain is now intermittent about the lower back. Sometimes the pain will radiate down the posterior thighs bilaterally. She also reports a meniscus tear in the Lt knee that was treated conservatively, which will occasionally  hurt after strenuous activity or twisting activity. No numbness/tingling. No red flag symptoms.   PERTINENT HISTORY:  Hearing loss Hysterectomy  Per patient Lt meniscal tear   PAIN:  Are you having pain? Yes: NPRS scale: 1 Pain location: low back Pain description: ache Aggravating factors: twisting maneuver Relieving factors: rest    PRECAUTIONS: None  WEIGHT BEARING RESTRICTIONS: No  FALLS:  Has patient fallen in last 6 months? No  LIVING ENVIRONMENT: Lives with: lives with their family Lives in: House/apartment Stairs: Yes: Internal: flight steps; on left going up and External: 4 steps; none Has following equipment at home: None  OCCUPATION: unemployed  PLOF: Independent  PATIENT GOALS: I want to be healed.    OBJECTIVE:  Note: Objective measures were completed at Evaluation unless otherwise noted.  DIAGNOSTIC FINDINGS:  Lumbar X-ray: IMPRESSION: 1. Similar mild L4-L5 and L5-S1 disc space narrowing. 2. Lower lumbar facet arthropathy.  PATIENT SURVEYS:  Modified Oswestry: 6% disability  COGNITION: Overall cognitive status: Within functional limits for tasks assessed     SENSATION: Not tested  MUSCLE LENGTH: Hamstrings: WNL bilaterally   POSTURE: decreased lumbar lordosis  PALPATION: [place]/TTP bilateral lumbar paraspinals   LUMBAR ROM:   AROM eval  Flexion Full (ache in low back)   Extension WNL   Right lateral flexion WNL LBP  Left lateral flexion WNL LBP  Right rotation WNL  Left rotation WNL   (Blank rows = not tested)  LOWER EXTREMITY ROM:     Active  Right eval Left eval  Hip flexion    Hip extension    Hip abduction    Hip adduction    Hip internal rotation    Hip external rotation    Knee flexion    Knee extension    Ankle dorsiflexion    Ankle plantarflexion    Ankle inversion    Ankle eversion     (Blank rows = not tested)  LOWER EXTREMITY MMT:    MMT Right eval Left eval 01/14/24  Hip flexion 4+ 4 5  bilateral   Hip extension 4- 4- 5 bilateral   Hip abduction 4 4- 4+ Lt; 4+ Rt   Hip adduction     Hip internal rotation     Hip external rotation     Knee flexion     Knee extension     Ankle dorsiflexion     Ankle plantarflexion     Ankle inversion     Ankle eversion      (Blank rows =  not tested)  LUMBAR SPECIAL TESTS:  (-) SLR  FUNCTIONAL TESTS:  Squatting: excessive anterior tibial translation, trunk flexion, LOB   GAIT: Distance walked: 15 ft  Assistive device utilized: None Level of assistance: Complete Independence Comments: no obvious gait abnormalities  [facility] Adult PT Treatment:                                                DATE: 01/21/24  Neuromuscular re-ed: Bent knee fallout 2 x 10  90/90 toe tap 2 x 8  Dead bug UE only 2 x 10  90/90 leg extension x 10   Self Care: Recommended to keep food log prior to MD visit Vitals assessed and discussed findings with patient BP: 108/74; HR 57 Nutritional considerations and recommendations to discuss potential nutritionist referral at upcoming MD visit   [facility] Adult PT Treatment:                                                DATE: 01/14/24 Therapeutic Exercise: HEP update   Therapeutic Activity: Re-assessment to determine overall progress, educating patient on progress towards goals  Hip hinge x 5 in standing Med ball toss from hinge positioning 6 lbs x 5 Kettlebell swing multiple reps working on form; 5 lbs   Self Care: Recommendation for gardening device to pick up acorns Reviewed seated positioning for gardening and proper core activation    [facility] Adult PT Treatment:                                                DATE: 01/12/24 Therapeutic Exercise: Stability ball walk outs for back stretch x10  Reviewed HEP  Manual Therapy: Suboccipital release  STM to bil upper traps, lev scap, suboccipitals  Therapeutic Activity: Dead lifting from stool with 5# KB x10- patient education on proper body mechanics at  knee/hip joint  Dead lifting from stool with 5# KB several times targetting hip hinging using mirror therapy  Re-assessed STGs educating patient on overall progress as it relates to her functional abilities.     [facility] Adult PT Treatment:                                                DATE: 01/07/24 Therapeutic Exercise: Low back walk outs with stability ball x10  Manual Therapy: STM to bil upper traps, Rhoms, Lev scap Neuromuscular re-ed: Supine posterior pelvic tilt 2x12 90/90 isometric hold 10 sec x5- explained how to keep arch of back flat on table  90/90 heel taps x10 each side alternating legs MELT Method using blue foam roller: Gliding superior/infer, Shearing side to side, Gliding to distal posterior gastroc to decrease neurofascial restriction  Self Care: Discussed stress effects on body health  [facility] Adult PT Treatment:  DATE: 12/25/23  Neuromuscular re-ed: Supine TA march 2 x 10  90/90 isometric hold 3 x 10 sec hold  Therapeutic Activity: Deadlift 10 lbs multiple reps focusing on form Lifting 10 lb kettlebell from stool and placing on chair turning whole body to perform multiple reps Farmer's carry attempted d/c due to Lt knee pain   Self Care: Reviewed bending and lifting mechanics, educated on avoiding weighted twisting  Discussed limiting deep squatting and twisting/pivoting due to known meniscal injury     PATIENT EDUCATION:  Education details: see treatment  Person educated: Patient Education method: Explanation Education comprehension: verbalized understanding  HOME EXERCISE PROGRAM: Access Code: [email or web address] URL: [email or web address] Date: 01/14/2024 Prepared by: Lucie Meeter  Exercises - Supine Posterior Pelvic Tilt  - 1 x daily - 7 x weekly - 3 sets - 10 reps - 5 sec  hold - Hooklying Clamshell with Resistance  - 1 x daily - 7 x weekly - 3 sets - 10 reps - Supine March with Posterior Pelvic  Tilt  - 1 x daily - 7 x weekly - 3 sets - 10 reps - Standing Anti-Rotation Press with Anchored Resistance  - 1 x daily - 7 x weekly - 2 sets - 10 reps - Standing Hip Hinge  - 1 x daily - 7 x weekly - 2 sets - 10 reps  ASSESSMENT:  CLINICAL IMPRESSION: Patient reports ongoing GI and cold symptoms and has f/u with Dr. Alvan for these ongoing issues tomorrow. Vitals were assessed and WNL. We completed mat core based strengthening given current symptoms related to GI and her cold. She tolerated core stabilization demonstrating improved activation with 90/90 dynamic core activity compared to previous sessions. Reported mild back discomfort in 90/90 positioning, but this was relieved with use of towel under her back. Overall tolerated session well.   EVAL: Patient is a 63 y.o. female who was seen today for physical therapy evaluation and treatment for acute on chronic low back pain with recent exacerbation earlier this month attributed to gardening activity. Upon assessment she is noted to have good lumbar AROM with pain elicited with trunk flexion and lateral flexion. She has bilateral hip weakness and core weakness, aberrant lifting mechanics, and postural abnormalities. She will benefit from skilled PT to address the above stated deficits in order to optimize her function and assist in overall pain reduction.      GOALS: Goals reviewed with patient? Yes  SHORT TERM GOALS: Target date: 12/17/2023    Patient will be independent and compliant with initial HEP.   Baseline: see above Goal status: MET  2.  Patient will demonstrate proper lifting mechanics to reduce stress on her back.  Baseline: see above  12/17/23: cues required for proper hip/knee engagement 12/25/23: cues required for hip hinge  01/12/24: proper lift with cues for hip hinge  01/14/24: cues required for hip hinge, foot positioning  Goal status: ongoing    3.  Patient will demonstrate proper posture with seated activity on  the floor to reduce stress on her back.  Baseline: demonstrates: long sit with trunk flexed, or criss crossed with trunk flexed  12/17/23: educated on neutral spine and core activation with seated positioning  Goal status: MET on 01/12/24    LONG TERM GOALS: Target date: 02/28/24  Patient will demonstrate proper core activation with standing activity to reduce stress on her back with household tasks.  Baseline: poor core activation 01/14/24: remains challenged with dynamic supine core stabilization  Goal status: progressing  2.  Patient will demonstrate 5/5 bilateral hip strength to improve stability about the chain with prolonged walking/standing activity.  Baseline: see above Goal status: partially met    3.  Patient will report pain at worst rated as </= 3/10 to reduce current functional limitations.  Baseline: 5 01/14/24: 1 soreness at worst  Goal status: MET  4.  Patient will be independent with advanced home program to assist in management of her chronic condition.  Baseline: see above Goal status: progressing    PLAN:  PT FREQUENCY: 1x/week  PT DURATION: 6 weeks  PLANNED INTERVENTIONS: 97164- PT Re-evaluation, 97750- Physical Performance Testing, 97110-Therapeutic exercises, 97530- Therapeutic activity, [id number]- Neuromuscular re-education, 97535- Self Care, 02859- Manual therapy, [id number]- Gait training, [phone]- Aquatic Therapy, [phone] (1-2 muscles), 20561 (3+ muscles)- Dry Needling, Cryotherapy, and Moist heat.  PLAN FOR NEXT SESSION: review and progress HEP prn.core stabilization. Progress hip hinge and kettlebell swings. Standing core strengthening (rotation)   Lucie Meeter, PT, DPT, ATC 01/21/24 11:53 AM

## 2024-01-22 ENCOUNTER — Ambulatory Visit: Admitting: Family Medicine

## 2024-01-22 ENCOUNTER — Ambulatory Visit

## 2024-01-22 ENCOUNTER — Encounter: Payer: Self-pay | Admitting: Family Medicine

## 2024-01-22 VITALS — BP 126/69 | HR 56 | Ht 64.0 in | Wt 134.1 lb

## 2024-01-22 DIAGNOSIS — G43709 Chronic migraine without aura, not intractable, without status migrainosus: Secondary | ICD-10-CM

## 2024-01-22 DIAGNOSIS — R052 Subacute cough: Secondary | ICD-10-CM

## 2024-01-22 DIAGNOSIS — R1013 Epigastric pain: Secondary | ICD-10-CM

## 2024-01-22 DIAGNOSIS — R059 Cough, unspecified: Secondary | ICD-10-CM | POA: Diagnosis not present

## 2024-01-22 NOTE — Progress Notes (Signed)
 Acute Office Visit  Patient ID: Anne Mcdonald, female    DOB: 08-Oct-1960, 63 y.o.   MRN: [id number]  PCP: Clayton Quale, MD  Chief Complaint  Patient presents with   Abdominal Pain    Subjective:     HPI  Hungry but stomach hurts, belching.   Discussed the use of AI scribe software for clinical note transcription with the patient, who gave verbal consent to proceed.  History of Present Illness Anne Mcdonald is a 63 year old female who presents with persistent cough and gastrointestinal issues.  Upper respiratory symptoms - Persistent sinus congestion and head pain, particularly at the occiput - Symptoms initially thought to be due to a cold - No history of allergies, but allergy medication provided symptomatic relief for congestion - Antibiotics (augmentin) did not improve symptoms - Ongoing use of inhaler since January for breathing difficulties associated with congestion, with improvement in cough and breathing  Cough and lower respiratory symptoms - Persistent cough for approximately one month, possibly post-viral in origin - Cough lingers after initial cold or viral illness - Inhaler use for breathing difficulties, especially when supine - Intermittent wheezing, esp worse at night when lays down.   Gastrointestinal symptoms - History of irritable bowel syndrome - Ongoing gastrointestinal issues with near-daily bowel movements, uncertain if this is temporary - Stomach pain and migraines after eating certain foods, such as fruit - Lightheadedness and confusion, particularly after eating or during activities such as shopping - Nausea in the morning, unable to eat immediately after waking - Tried Linzess  without relief - Amitiza  caused chest pain and breathing difficulties - Daily use of omeprazole  for stomach issues - Limited dietary variety; recently tried a green vegetable smoothie but found it unpalatable  Neurological symptoms - Lightheadedness  and confusion, especially during a recent shopping trip - Migraines after eating certain foods  Cardiovascular and chest symptoms - Blood pressure higher than usual but remains within normal range - Chest pain, particularly in the left breast region - History of a cyst in the left breast - Due for mammogram and bone density test   ROS     Objective:    BP 126/69   Pulse (!) 56   Ht 5' 4 (1.626 m)   Wt 134 lb 1.6 oz (60.8 kg)   LMP 07/17/2016   SpO2 95%   BMI 23.02 kg/m    Physical Exam Vitals and nursing note reviewed.  Constitutional:      Appearance: Normal appearance.  HENT:     Head: Normocephalic and atraumatic.  Eyes:     Conjunctiva/sclera: Conjunctivae normal.  Cardiovascular:     Rate and Rhythm: Normal rate and regular rhythm.  Pulmonary:     Effort: Pulmonary effort is normal.     Breath sounds: Normal breath sounds.  Skin:    General: Skin is warm and dry.  Neurological:     Mental Status: She is alert and oriented to person, place, and time.  Psychiatric:        Mood and Affect: Mood normal.        Behavior: Behavior normal.       No results found for any visits on 01/22/24.     Assessment & Plan:   Problem List Items Addressed This Visit       Cardiovascular and Mediastinum   Chronic migraine without aura without status migrainosus, not intractable   Other Visit Diagnoses       Subacute cough    -  Primary   Relevant Orders   DG Chest 2 View   Food Allergy Profile   Allergen Grape f259   CBC with Differential/Platelet     Epigastric pain       Relevant Orders   Food Allergy Profile   Allergen Grape f259   CBC with Differential/Platelet   CMP14+EGFR   Lipase   CT ABDOMEN PELVIS W CONTRAST   Ambulatory referral to Gastroenterology       Assessment and Plan Assessment & Plan Chronic cough and upper respiratory symptoms Persistent cough with congestion and head pain, likely allergic or viral. - Recommend daily allergy  medication for three weeks. - Order chest x-ray to rule out other causes. Consider CT for further workup.   - Advise inhaler use as needed for wheezing. Has one at home.   Chest pain and shortness of breath Intermittent symptoms possibly related to respiratory issues. - Order chest x-ray. - Consider CT scan if x-ray indicates further investigation.  Abdominal pain and possible gastritis Abdominal pain with possible gastritis, exacerbated by certain foods. - Discontinue omeprazole  for two weeks for H. pylori test. - Advise dietary modifications to avoid acidic, spicy, and greasy foods. - Order H. pylori breath test after omeprazole  discontinuation.  Irritable bowel syndrome with constipation IBS with constipation, recent improvement in bowel movements but persistent discomfort. - Refer to gastroenterology. - Consider food sensitivity panel. - Advise dietary modifications to avoid known irritants.  Lightheadedness and dizziness Episodes possibly related to sinus congestion or other issues, with normal blood pressure readings. - Consider further evaluation if symptoms persist.  General Health Maintenance Mammogram scheduling issues discussed. - Schedule mammogram at alternative location if needed. - Proceed with scheduled bone density test.  Headaches. Hx of migraine but having frequent post headache.  - could be related to sinuses but consider Brain MRI for further workup.     No orders of the defined types were placed in this encounter.   Return if symptoms worsen or fail to improve.  Dorothyann Byars, MD [facility] at [facility]

## 2024-01-24 LAB — CBC WITH DIFFERENTIAL/PLATELET
Basophils Absolute: 0 x10E3/uL (ref 0.0–0.2)
Basos: 0 %
EOS (ABSOLUTE): 0.1 x10E3/uL (ref 0.0–0.4)
Eos: 1 %
Hematocrit: 40.2 % (ref 34.0–46.6)
Hemoglobin: 13.3 g/dL (ref 11.1–15.9)
Immature Grans (Abs): 0 x10E3/uL (ref 0.0–0.1)
Immature Granulocytes: 0 %
Lymphocytes Absolute: 2.1 x10E3/uL (ref 0.7–3.1)
Lymphs: 25 %
MCH: 32.5 pg (ref 26.6–33.0)
MCHC: 33.1 g/dL (ref 31.5–35.7)
MCV: 98 fL — ABNORMAL HIGH (ref 79–97)
Monocytes Absolute: 0.4 x10E3/uL (ref 0.1–0.9)
Monocytes: 5 %
Neutrophils Absolute: 5.8 x10E3/uL (ref 1.4–7.0)
Neutrophils: 69 %
Platelets: 300 x10E3/uL (ref 150–450)
RBC: 4.09 x10E6/uL (ref 3.77–5.28)
RDW: 12.6 % (ref 11.7–15.4)
WBC: 8.5 x10E3/uL (ref 3.4–10.8)

## 2024-01-24 LAB — CMP14+EGFR
ALT: 14 IU/L (ref 0–32)
AST: 19 IU/L (ref 0–40)
Albumin: 4.6 g/dL (ref 3.9–4.9)
Alkaline Phosphatase: 79 IU/L (ref 49–135)
BUN/Creatinine Ratio: 15 (ref 12–28)
BUN: 10 mg/dL (ref 8–27)
Bilirubin Total: 0.6 mg/dL (ref 0.0–1.2)
CO2: 25 mmol/L (ref 20–29)
Calcium: 9.8 mg/dL (ref 8.7–10.3)
Chloride: 101 mmol/L (ref 96–106)
Creatinine, Ser: 0.65 mg/dL (ref 0.57–1.00)
Globulin, Total: 1.7 g/dL (ref 1.5–4.5)
Glucose: 77 mg/dL (ref 70–99)
Potassium: 4.4 mmol/L (ref 3.5–5.2)
Sodium: 140 mmol/L (ref 134–144)
Total Protein: 6.3 g/dL (ref 6.0–8.5)
eGFR: 98 mL/min/1.73 (ref >59)

## 2024-01-24 LAB — IGG ALLERGENS(10)
Casein IgG: 3.1 ug/mL — ABNORMAL HIGH (ref 0.0–1.9)
Corn IgG: <2 ug/mL (ref 0.0–1.9)
F001-IgG Egg White: <2 ug/mL (ref 0.0–1.9)
F027-IgG Beef: 4.6 ug/mL — ABNORMAL HIGH (ref 0.0–1.9)
F033-IgG Orange: <2 ug/mL (ref 0.0–1.9)
F040-IgG Tuna: <2 ug/mL (ref 0.0–1.9)
Oat IgG: 2.9 ug/mL — ABNORMAL HIGH (ref 0.0–1.9)
Pork IgG: <2 ug/mL (ref 0.0–1.9)
Tomato IgG: <2 ug/mL (ref 0.0–1.9)
Wheat IgG: <2 ug/mL (ref 0.0–1.9)

## 2024-01-24 LAB — LIPASE: Lipase: 46 U/L (ref 14–72)

## 2024-01-24 LAB — ALLERGEN GRAPE F259: Allergen Grape IgE: <0.1 kU/L

## 2024-01-26 ENCOUNTER — Ambulatory Visit: Payer: Self-pay | Admitting: Family Medicine

## 2024-01-26 MED ORDER — BUDESONIDE-FORMOTEROL FUMARATE 80-4.5 MCG/ACT IN AERO: 2.0000 | INHALATION_SPRAY | Freq: Two times a day (BID) | RESPIRATORY_TRACT | 3 refills | Status: AC | PRN

## 2024-01-26 NOTE — Telephone Encounter (Signed)
 Meds ordered this encounter  Medications   budesonide-formoterol (SYMBICORT) 80-4.5 MCG/ACT inhaler    Sig: Inhale 2 puffs into the lungs 2 (two) times daily as needed.    Dispense:  1 each    Refill:  3

## 2024-01-26 NOTE — Progress Notes (Signed)
 Hi Anne Mcdonald, chest x-ray looks good no sign of pneumonia or excess fluid.  Cough is probably being caused by just some increased inflammation in the bronchial tubes.  Would you be okay with doing an inhaler for a couple of weeks to see if that would help?

## 2024-01-27 ENCOUNTER — Ambulatory Visit: Payer: Self-pay | Attending: Family Medicine

## 2024-01-27 DIAGNOSIS — M6281 Muscle weakness (generalized): Secondary | ICD-10-CM | POA: Diagnosis present

## 2024-01-27 DIAGNOSIS — M5459 Other low back pain: Secondary | ICD-10-CM | POA: Diagnosis present

## 2024-01-27 NOTE — Therapy (Addendum)
 OUTPATIENT PHYSICAL THERAPY THORACOLUMBAR TREATMENT    Patient Name: Anne Mcdonald MRN: [id number] DOB:1960-04-23,63 y.o., female Today's Date: 01/27/2024   END OF SESSION:  PT End of Session - 01/27/24 1148     Visit Number 13    Number of Visits 17    Date for Recertification  02/28/24    Authorization Type [employer]    PT Start Time 1148    PT Stop Time 1232    PT Time Calculation (min) 44 min    Activity Tolerance Patient tolerated treatment well    Behavior During Therapy [facility] for tasks assessed/performed                      Past Medical History:  Diagnosis Date   Arthritis 2020   Fibroid uterus    GERD (gastroesophageal reflux disease)    PUD (peptic ulcer disease)    Past Surgical History:  Procedure Laterality Date   ABDOMINAL HYSTERECTOMY  2017   CESAREAN SECTION     CHOLECYSTECTOMY     TOTAL ABDOMINAL HYSTERECTOMY W/ BILATERAL SALPINGOOPHORECTOMY  08/2016   Patient Active Problem List   Diagnosis Date Noted   Right foot pain 10/31/2023   Lumbar degenerative disc disease 10/28/2023   Chest pain 05/12/2022   Left knee injury 06/15/2021   Metatarsalgia, left foot 08/02/2020   Memory impairment 02/14/2020   Hearing loss 12/10/2019   Irritable bowel syndrome with constipation 03/05/2019   RLS (restless legs syndrome) 07/15/2018   Seborrheic dermatitis 07/15/2018   Primary osteoarthritis of both hands 05/02/2017   Right carpal tunnel syndrome 05/02/2017   Dyshidrotic eczema 12/24/2016   Uterovaginal prolapse 08/27/2016   Bipolar 1 disorder (HCC) 08/02/2016   Moderate episode of recurrent major depressive disorder (HCC) 05/07/2016   Uterine fibroid 09/18/2015   Chronic migraine without aura without status migrainosus, not intractable 05/02/2015   Medication overuse headache 05/02/2015   Headache, menstrual migraine, intractable, with status migrainosus 12/09/2014   GERD (gastroesophageal reflux disease) 11/29/2014   Paroxysmal SVT  (supraventricular tachycardia) 06/10/2012   CIGARETTE SMOKER 08/24/2009   Migraine without aura 09/07/2008   HYPERTRIGLYCERIDEMIA 07/19/2008   Premenstrual tension syndrome 04/24/2006   HOT FLASHES 04/24/2006   Acute left-sided low back pain with left-sided sciatica 04/24/2006    PCP: Alvan Dorothyann BIRCH, MD   REFERRING PROVIDER: Alvan Dorothyann BIRCH, MD   REFERRING DIAG:  M54.50,G89.29 (ICD-10-CM) - Chronic midline low back pain without sciatica  R07.81 (ICD-10-CM) - Rib pain on left side  M54.6 (ICD-10-CM) - Acute midline thoracic back pain    Rationale for Evaluation and Treatment: Rehabilitation  THERAPY DIAG:  Muscle weakness (generalized)  Other low back pain  ONSET DATE: acute on chronic; recent exacerbation August 2025   SUBJECTIVE:  SUBJECTIVE STATEMENT: Pt reports going ot PCP regarding ongoing GI issues and cold symptoms; chest x ray was unremarkable. PCP referred her to digestive health doctor that she sees next week. Pt reports she thinks it could be gastritis. She states her back has been great, no pain, she was able to mow her lawn past week.   EVAL: Patient reports her back pain started around 20 years when she herniated her disc and thinks it was from working out. Every few years she would hurt it again. Her most recent exacerbation of pain began at the beginning of August. She was doing a lot of yard work. She thinks it was from sitting pulling weeds where she was having to twist and turn a lot. She underwent physical therapy when she initially hurt her back, which was helpful. The pain is now intermittent about the lower back. Sometimes the pain will radiate down the posterior thighs bilaterally. She also reports a meniscus tear in the Lt knee that was treated conservatively,  which will occasionally hurt after strenuous activity or twisting activity. No numbness/tingling. No red flag symptoms.   PERTINENT HISTORY:  Hearing loss Hysterectomy  Per patient Lt meniscal tear   PAIN:  Are you having pain? Yes: NPRS scale: 0/10 Pain location: low back Pain description: ache Aggravating factors: twisting maneuver Relieving factors: rest    PRECAUTIONS: None  WEIGHT BEARING RESTRICTIONS: No  FALLS:  Has patient fallen in last 6 months? No  LIVING ENVIRONMENT: Lives with: lives with their family Lives in: House/apartment Stairs: Yes: Internal: flight steps; on left going up and External: 4 steps; none Has following equipment at home: None  OCCUPATION: unemployed  PLOF: Independent  PATIENT GOALS: I want to be healed.    OBJECTIVE:  Note: Objective measures were completed at Evaluation unless otherwise noted.  DIAGNOSTIC FINDINGS:  Lumbar X-ray: IMPRESSION: 1. Similar mild L4-L5 and L5-S1 disc space narrowing. 2. Lower lumbar facet arthropathy.  PATIENT SURVEYS:  Modified Oswestry: 6% disability  COGNITION: Overall cognitive status: Within functional limits for tasks assessed     SENSATION: Not tested  MUSCLE LENGTH: Hamstrings: WNL bilaterally   POSTURE: decreased lumbar lordosis  PALPATION: [place]/TTP bilateral lumbar paraspinals   LUMBAR ROM:   AROM eval  Flexion Full (ache in low back)   Extension WNL   Right lateral flexion WNL LBP  Left lateral flexion WNL LBP  Right rotation WNL  Left rotation WNL   (Blank rows = not tested)  LOWER EXTREMITY ROM:     Active  Right eval Left eval  Hip flexion    Hip extension    Hip abduction    Hip adduction    Hip internal rotation    Hip external rotation    Knee flexion    Knee extension    Ankle dorsiflexion    Ankle plantarflexion    Ankle inversion    Ankle eversion     (Blank rows = not tested)  LOWER EXTREMITY MMT:    MMT Right eval Left eval 01/14/24   Hip flexion 4+ 4 5 bilateral   Hip extension 4- 4- 5 bilateral   Hip abduction 4 4- 4+ Lt; 4+ Rt   Hip adduction     Hip internal rotation     Hip external rotation     Knee flexion     Knee extension     Ankle dorsiflexion     Ankle plantarflexion     Ankle inversion     Ankle eversion      (  Blank rows = not tested)  LUMBAR SPECIAL TESTS:  (-) SLR  FUNCTIONAL TESTS:  Squatting: excessive anterior tibial translation, trunk flexion, LOB   GAIT: Distance walked: 15 ft  Assistive device utilized: None Level of assistance: Complete Independence Comments: no obvious gait abnormalities   [facility] Adult PT Treatment:                                                DATE: 01/27/24   Manual Therapy: STM to left QL muscle -increased muscle tightness Neuromuscular re-ed: Bent knee fallout 2 x 10  90/90 toe tap 2 x 8  Single limb balance- Rt leg lasted 12 sec before losing balance, left unable  Heel raises in seated x20 for improved push off during gait  Therapeutic Activity: Ambulation around ortho gym  Self Care: Discussed pt follow up plan with PCP regarding gut health and GI issues  [facility] Adult PT Treatment:                                                DATE: 01/21/24  Neuromuscular re-ed: Bent knee fallout 2 x 10  90/90 toe tap 2 x 8  Dead bug UE only 2 x 10  90/90 leg extension x 10   Self Care: Recommended to keep food log prior to MD visit Vitals assessed and discussed findings with patient BP: 108/74; HR 57 Nutritional considerations and recommendations to discuss potential nutritionist referral at upcoming MD visit   [facility] Adult PT Treatment:                                                DATE: 01/14/24 Therapeutic Exercise: HEP update   Therapeutic Activity: Re-assessment to determine overall progress, educating patient on progress towards goals  Hip hinge x 5 in standing Med ball toss from hinge positioning 6 lbs x 5 Kettlebell swing multiple reps working on  form; 5 lbs   Self Care: Recommendation for gardening device to pick up acorns Reviewed seated positioning for gardening and proper core activation    [facility] Adult PT Treatment:                                                DATE: 01/12/24 Therapeutic Exercise: Stability ball walk outs for back stretch x10  Reviewed HEP  Manual Therapy: Suboccipital release  STM to bil upper traps, lev scap, suboccipitals  Therapeutic Activity: Dead lifting from stool with 5# KB x10- patient education on proper body mechanics at knee/hip joint  Dead lifting from stool with 5# KB several times targetting hip hinging using mirror therapy  Re-assessed STGs educating patient on overall progress as it relates to her functional abilities.     [facility] Adult PT Treatment:  DATE: 01/07/24 Therapeutic Exercise: Low back walk outs with stability ball x10  Manual Therapy: STM to bil upper traps, Rhoms, Lev scap Neuromuscular re-ed: Supine posterior pelvic tilt 2x12 90/90 isometric hold 10 sec x5- explained how to keep arch of back flat on table  90/90 heel taps x10 each side alternating legs MELT Method using blue foam roller: Gliding superior/infer, Shearing side to side, Gliding to distal posterior gastroc to decrease neurofascial restriction  Self Care: Discussed stress effects on body health  [facility] Adult PT Treatment:                                                DATE: 12/25/23  Neuromuscular re-ed: Supine TA march 2 x 10  90/90 isometric hold 3 x 10 sec hold  Therapeutic Activity: Deadlift 10 lbs multiple reps focusing on form Lifting 10 lb kettlebell from stool and placing on chair turning whole body to perform multiple reps Farmer's carry attempted d/c due to Lt knee pain   Self Care: Reviewed bending and lifting mechanics, educated on avoiding weighted twisting  Discussed limiting deep squatting and twisting/pivoting due to known meniscal  injury     PATIENT EDUCATION:  Education details: see treatment  Person educated: Patient Education method: Explanation Education comprehension: verbalized understanding  HOME EXERCISE PROGRAM: Access Code: [email or web address] URL: [email or web address] Date: 01/14/2024 Prepared by: Lucie Meeter  Exercises - Supine Posterior Pelvic Tilt  - 1 x daily - 7 x weekly - 3 sets - 10 reps - 5 sec  hold - Hooklying Clamshell with Resistance  - 1 x daily - 7 x weekly - 3 sets - 10 reps - Supine March with Posterior Pelvic Tilt  - 1 x daily - 7 x weekly - 3 sets - 10 reps - Standing Anti-Rotation Press with Anchored Resistance  - 1 x daily - 7 x weekly - 2 sets - 10 reps - Standing Hip Hinge  - 1 x daily - 7 x weekly - 2 sets - 10 reps  ASSESSMENT:  CLINICAL IMPRESSION: Patient reports ongoing GI issues had f/u with PCP who referred her to digestive health doctor. Pt's back pain has improved, reporting no back pain today, however left knee pain during ambulation. Tolerated core stabilization until end of last set, reporting discomfort in low back/sacral area even after towel was applied to provide cushion, stating 'it feels like it's my bone'. Discontinued back exercises for standing balance in single limb stance. Pt unable to complete SLS on left leg, however held right leg SLS for 12 sec before losing balance. Practiced heel raises to improve push off during gait cycle, that caused mild right foot discomfort, thus discontinued and finished with STM to left QL to decrease muscle tightness.     Vitals were assessed and WNL. We completed mat core based strengthening given current symptoms related to GI and her cold. She tolerated core stabilization demonstrating improved activation with 90/90 dynamic core activity compared to previous sessions. Reported mild back discomfort in 90/90 positioning, but this was relieved with use of towel under her back. Overall tolerated session well.   EVAL:  Patient is a 63 y.o. female who was seen today for physical therapy evaluation and treatment for acute on chronic low back pain with recent exacerbation earlier this month attributed to gardening activity. Upon assessment she is noted to have good lumbar  AROM with pain elicited with trunk flexion and lateral flexion. She has bilateral hip weakness and core weakness, aberrant lifting mechanics, and postural abnormalities. She will benefit from skilled PT to address the above stated deficits in order to optimize her function and assist in overall pain reduction.      GOALS: Goals reviewed with patient? Yes  SHORT TERM GOALS: Target date: 12/17/2023    Patient will be independent and compliant with initial HEP.   Baseline: see above Goal status: MET  2.  Patient will demonstrate proper lifting mechanics to reduce stress on her back.  Baseline: see above  12/17/23: cues required for proper hip/knee engagement 12/25/23: cues required for hip hinge  01/12/24: proper lift with cues for hip hinge  01/14/24: cues required for hip hinge, foot positioning  Goal status: ongoing    3.  Patient will demonstrate proper posture with seated activity on the floor to reduce stress on her back.  Baseline: demonstrates: long sit with trunk flexed, or criss crossed with trunk flexed  12/17/23: educated on neutral spine and core activation with seated positioning  Goal status: MET on 01/12/24    LONG TERM GOALS: Target date: 02/28/24  Patient will demonstrate proper core activation with standing activity to reduce stress on her back with household tasks.  Baseline: poor core activation 01/14/24: remains challenged with dynamic supine core stabilization  Goal status: progressing   2.  Patient will demonstrate 5/5 bilateral hip strength to improve stability about the chain with prolonged walking/standing activity.  Baseline: see above Goal status: partially met    3.  Patient will report pain at worst  rated as </= 3/10 to reduce current functional limitations.  Baseline: 5 01/14/24: 1 soreness at worst  Goal status: MET  4.  Patient will be independent with advanced home program to assist in management of her chronic condition.  Baseline: see above Goal status: progressing    PLAN:  PT FREQUENCY: 1x/week  PT DURATION: 6 weeks  PLANNED INTERVENTIONS: 97164- PT Re-evaluation, 97750- Physical Performance Testing, 97110-Therapeutic exercises, 97530- Therapeutic activity, [id number]- Neuromuscular re-education, 97535- Self Care, [id number]- Manual therapy, [id number]- Gait training, [phone]- Aquatic Therapy, [phone] (1-2 muscles), [id number] (3+ muscles)- Dry Needling, Cryotherapy, and Moist heat.  PLAN FOR NEXT SESSION: review and progress HEP prn.core stabilization. Progress hip hinge and kettlebell swings. Standing core strengthening (rotation)   Lucie Meeter, PT, DPT, ATC 01/27/24 2:55 PM Anne Mcdonald, SPT 01/27/24 2:55 PM

## 2024-02-03 ENCOUNTER — Ambulatory Visit: Payer: Self-pay

## 2024-02-03 DIAGNOSIS — M6281 Muscle weakness (generalized): Secondary | ICD-10-CM | POA: Diagnosis not present

## 2024-02-03 DIAGNOSIS — M5459 Other low back pain: Secondary | ICD-10-CM

## 2024-02-03 NOTE — Therapy (Signed)
 OUTPATIENT PHYSICAL THERAPY THORACOLUMBAR TREATMENT    Patient Name: Anne Mcdonald MRN: [id number] DOB:27-Oct-1960,63 y.o., female Today's Date: 02/03/2024   END OF SESSION:  PT End of Session - 02/03/24 1146     Visit Number 14    Number of Visits 17    Date for Recertification  02/28/24    Authorization Type [employer]    PT Start Time 1147    PT Stop Time 1228    PT Time Calculation (min) 41 min    Activity Tolerance Patient tolerated treatment well    Behavior During Therapy [facility] for tasks assessed/performed                Past Medical History:  Diagnosis Date   Arthritis 2020   Fibroid uterus    GERD (gastroesophageal reflux disease)    PUD (peptic ulcer disease)    Past Surgical History:  Procedure Laterality Date   ABDOMINAL HYSTERECTOMY  2017   CESAREAN SECTION     CHOLECYSTECTOMY     TOTAL ABDOMINAL HYSTERECTOMY W/ BILATERAL SALPINGOOPHORECTOMY  08/2016   Patient Active Problem List   Diagnosis Date Noted   Right foot pain 10/31/2023   Lumbar degenerative disc disease 10/28/2023   Chest pain 05/12/2022   Left knee injury 06/15/2021   Metatarsalgia, left foot 08/02/2020   Memory impairment 02/14/2020   Hearing loss 12/10/2019   Irritable bowel syndrome with constipation 03/05/2019   RLS (restless legs syndrome) 07/15/2018   Seborrheic dermatitis 07/15/2018   Primary osteoarthritis of both hands 05/02/2017   Right carpal tunnel syndrome 05/02/2017   Dyshidrotic eczema 12/24/2016   Uterovaginal prolapse 08/27/2016   Bipolar 1 disorder (HCC) 08/02/2016   Moderate episode of recurrent major depressive disorder (HCC) 05/07/2016   Uterine fibroid 09/18/2015   Chronic migraine without aura without status migrainosus, not intractable 05/02/2015   Medication overuse headache 05/02/2015   Headache, menstrual migraine, intractable, with status migrainosus 12/09/2014   GERD (gastroesophageal reflux disease) 11/29/2014   Paroxysmal SVT  (supraventricular tachycardia) 06/10/2012   CIGARETTE SMOKER 08/24/2009   Migraine without aura 09/07/2008   HYPERTRIGLYCERIDEMIA 07/19/2008   Premenstrual tension syndrome 04/24/2006   HOT FLASHES 04/24/2006   Acute left-sided low back pain with left-sided sciatica 04/24/2006    PCP: Alvan Dorothyann BIRCH, MD   REFERRING PROVIDER: Alvan Dorothyann BIRCH, MD   REFERRING DIAG:  M54.50,G89.29 (ICD-10-CM) - Chronic midline low back pain without sciatica  R07.81 (ICD-10-CM) - Rib pain on left side  M54.6 (ICD-10-CM) - Acute midline thoracic back pain    Rationale for Evaluation and Treatment: Rehabilitation  THERAPY DIAG:  Other low back pain  Muscle weakness (generalized)  ONSET DATE: acute on chronic; recent exacerbation August 2025   SUBJECTIVE:  SUBJECTIVE STATEMENT: Pt reports her knee left is bothering her, 3/10 with brace on, 4-5/10 without brace. States pain has lasted for a week which is unusual. Pt went to [facility] yesterday regarding digestive health, they've scheduled her to have endoscopy tomorrow. Pt states her back feels great, no aches and pains.   EVAL: Patient reports her back pain started around 20 years when she herniated her disc and thinks it was from working out. Every few years she would hurt it again. Her most recent exacerbation of pain began at the beginning of August. She was doing a lot of yard work. She thinks it was from sitting pulling weeds where she was having to twist and turn a lot. She underwent physical therapy when she initially hurt her back, which was helpful. The pain is now intermittent about the lower back. Sometimes the pain will radiate down the posterior thighs bilaterally. She also reports a meniscus tear in the Lt knee that was treated conservatively, which will  occasionally hurt after strenuous activity or twisting activity. No numbness/tingling. No red flag symptoms.   PERTINENT HISTORY:  Hearing loss Hysterectomy  Per patient Lt meniscal tear   PAIN:  Are you having pain? Yes: NPRS scale: 0/10 Pain location: low back Pain description: ache Aggravating factors: twisting maneuver Relieving factors: rest    PRECAUTIONS: None  WEIGHT BEARING RESTRICTIONS: No  FALLS:  Has patient fallen in last 6 months? No  LIVING ENVIRONMENT: Lives with: lives with their family Lives in: House/apartment Stairs: Yes: Internal: flight steps; on left going up and External: 4 steps; none Has following equipment at home: None  OCCUPATION: unemployed  PLOF: Independent  PATIENT GOALS: I want to be healed.    OBJECTIVE:  Note: Objective measures were completed at Evaluation unless otherwise noted.  DIAGNOSTIC FINDINGS:  Lumbar X-ray: IMPRESSION: 1. Similar mild L4-L5 and L5-S1 disc space narrowing. 2. Lower lumbar facet arthropathy.  PATIENT SURVEYS:  Modified Oswestry: 6% disability  COGNITION: Overall cognitive status: Within functional limits for tasks assessed     SENSATION: Not tested  MUSCLE LENGTH: Hamstrings: WNL bilaterally   POSTURE: decreased lumbar lordosis  PALPATION: [place]/TTP bilateral lumbar paraspinals   LUMBAR ROM:   AROM eval  Flexion Full (ache in low back)   Extension WNL   Right lateral flexion WNL LBP  Left lateral flexion WNL LBP  Right rotation WNL  Left rotation WNL   (Blank rows = not tested)  LOWER EXTREMITY ROM:     Active  Right eval Left eval  Hip flexion    Hip extension    Hip abduction    Hip adduction    Hip internal rotation    Hip external rotation    Knee flexion    Knee extension    Ankle dorsiflexion    Ankle plantarflexion    Ankle inversion    Ankle eversion     (Blank rows = not tested)  LOWER EXTREMITY MMT:    MMT Right eval Left eval 01/14/24  Hip  flexion 4+ 4 5 bilateral   Hip extension 4- 4- 5 bilateral   Hip abduction 4 4- 4+ Lt; 4+ Rt   Hip adduction     Hip internal rotation     Hip external rotation     Knee flexion     Knee extension     Ankle dorsiflexion     Ankle plantarflexion     Ankle inversion     Ankle eversion      (  Blank rows = not tested)  LUMBAR SPECIAL TESTS:  (-) SLR  FUNCTIONAL TESTS:  Squatting: excessive anterior tibial translation, trunk flexion, LOB   GAIT: Distance walked: 15 ft  Assistive device utilized: None Level of assistance: Complete Independence Comments: no obvious gait abnormalities  [facility] Adult PT Treatment:                                                DATE: 02/03/24  Neuromuscular re-ed: Abdominal bracing in seated x10   AROM shoulder retraction x10 for postural strengthening  Therapeutic Activity: Hip hinging in seated x8 with dowel-targetting straight back/decreased thoracic flexion Hip hinging in standing x8 with dowel- target carryover from seated hip hinges  Dead lift x10/picking up block from floor- emphasis on hinging at hips not knees, engaging core and glutes Self Care: Discussed left knee pain/issue, recommend she follow up with her orthopedic doctor  [facility] Adult PT Treatment:                                                DATE: 01/27/24   Manual Therapy: STM to left QL muscle -increased muscle tightness Neuromuscular re-ed: Bent knee fallout 2 x 10  90/90 toe tap 2 x 8  Single limb balance- Rt leg lasted 12 sec before losing balance, left unable  Heel raises in seated x20 for improved push off during gait  Therapeutic Activity: Ambulation around ortho gym  Self Care: Discussed pt follow up plan with PCP regarding gut health and GI issues  [facility] Adult PT Treatment:                                                DATE: 01/21/24  Neuromuscular re-ed: Bent knee fallout 2 x 10  90/90 toe tap 2 x 8  Dead bug UE only 2 x 10  90/90 leg extension x 10   Self  Care: Recommended to keep food log prior to MD visit Vitals assessed and discussed findings with patient BP: 108/74; HR 57 Nutritional considerations and recommendations to discuss potential nutritionist referral at upcoming MD visit   [facility] Adult PT Treatment:                                                DATE: 01/14/24 Therapeutic Exercise: HEP update   Therapeutic Activity: Re-assessment to determine overall progress, educating patient on progress towards goals  Hip hinge x 5 in standing Med ball toss from hinge positioning 6 lbs x 5 Kettlebell swing multiple reps working on form; 5 lbs   Self Care: Recommendation for gardening device to pick up acorns Reviewed seated positioning for gardening and proper core activation    PATIENT EDUCATION:  Education details: Psychiatrist during dead lifts(Hip hinging with back straight ) Person educated: Patient Education method: Explanation, actor and verbal cues Education comprehension: verbalized understanding, demonstrated,   HOME EXERCISE PROGRAM: Access Code: [email or web address] URL: [email or web address] Date: 01/14/2024 Prepared by: Samantha Pexa  Exercises - Supine Posterior Pelvic Tilt  - 1 x daily - 7 x weekly - 3 sets - 10 reps - 5 sec  hold - Hooklying Clamshell with Resistance  - 1 x daily - 7 x weekly - 3 sets - 10 reps - Supine March with Posterior Pelvic Tilt  - 1 x daily - 7 x weekly - 3 sets - 10 reps - Standing Anti-Rotation Press with Anchored Resistance  - 1 x daily - 7 x weekly - 2 sets - 10 reps - Standing Hip Hinge  - 1 x daily - 7 x weekly - 2 sets - 10 reps  ASSESSMENT:  CLINICAL IMPRESSION:  Pt presents to skilled therapy with significantly improved functional capacity and decreased pain in low back however currently presents with left knee pain, causing her to wear knee brace. Her left knee pain negatively impacted body mechanics during dead lift transfer practicing during today session,  initially demonstrated genu varum appearance in left knee secondary to pain, forward flexion in thoracic spine and decreased motion in bil hips. Verbal cues to decrease knee joint ROM and increase hip ROM instead, pt able to make adjustment appropriately. Incorporated the use of the dowel alongside pt's back for tactile cues keeping back straight first in seated then in standing; she performed good carryover. Recommended pt follow up with orthopedic doctor regarding left knee pain. Pt plans to get endoscopy tomorrow to address ongoing gut health.      EVAL: Patient is a 63 y.o. female who was seen today for physical therapy evaluation and treatment for acute on chronic low back pain with recent exacerbation earlier this month attributed to gardening activity. Upon assessment she is noted to have good lumbar AROM with pain elicited with trunk flexion and lateral flexion. She has bilateral hip weakness and core weakness, aberrant lifting mechanics, and postural abnormalities. She will benefit from skilled PT to address the above stated deficits in order to optimize her function and assist in overall pain reduction.      GOALS: Goals reviewed with patient? Yes  SHORT TERM GOALS: Target date: 12/17/2023    Patient will be independent and compliant with initial HEP.   Baseline: see above Goal status: MET  2.  Patient will demonstrate proper lifting mechanics to reduce stress on her back.  Baseline: see above  12/17/23: cues required for proper hip/knee engagement 12/25/23: cues required for hip hinge  01/12/24: proper lift with cues for hip hinge  01/14/24: cues required for hip hinge, foot positioning  Goal status: ongoing    3.  Patient will demonstrate proper posture with seated activity on the floor to reduce stress on her back.  Baseline: demonstrates: long sit with trunk flexed, or criss crossed with trunk flexed  12/17/23: educated on neutral spine and core activation with seated  positioning  Goal status: MET on 01/12/24    LONG TERM GOALS: Target date: 02/28/24  Patient will demonstrate proper core activation with standing activity to reduce stress on her back with household tasks.  Baseline: poor core activation 01/14/24: remains challenged with dynamic supine core stabilization  Goal status: progressing   2.  Patient will demonstrate 5/5 bilateral hip strength to improve stability about the chain with prolonged walking/standing activity.  Baseline: see above Goal status: partially met    3.  Patient will report pain at worst rated as </= 3/10 to reduce current functional limitations.  Baseline: 5 01/14/24: 1 soreness at worst  Goal status: MET  4.  Patient will be independent with advanced home program to assist in management of her chronic condition.  Baseline: see above Goal status: progressing    PLAN:  PT FREQUENCY: 1x/week  PT DURATION: 6 weeks  PLANNED INTERVENTIONS: 97164- PT Re-evaluation, 97750- Physical Performance Testing, 97110-Therapeutic exercises, 97530- Therapeutic activity, [id number]- Neuromuscular re-education, 97535- Self Care, [id number]- Manual therapy, [id number]- Gait training, [phone]- Aquatic Therapy, [phone] (1-2 muscles), [id number] (3+ muscles)- Dry Needling, Cryotherapy, and Moist heat.  PLAN FOR NEXT SESSION: review and progress HEP prn.core stabilization. Progress hip hinge and kettlebell swings. Standing core strengthening (rotation) , dead lifts   Lucie Meeter, PT, DPT, ATC 02/03/24 12:50 PM Lavanda Cleverly, SPT 02/03/24 12:50 PM

## 2024-02-04 ENCOUNTER — Encounter: Payer: Self-pay | Admitting: Family Medicine

## 2024-02-04 NOTE — Progress Notes (Signed)
 Hi Anne Mcdonald,  Your blood count looks okay.  I did get the 10 panel back again just has some things it is definitely not a comprehensive list but interestingly you did come back with elevated antibody levels in particular to beef, casein (in milk and cheese) and oats.  I remember this is not a true allergy like an IgE.  So these are not foods that she would eat and then break out in a rash or not be able to breathe but these are foods that could definitely upset your digestive track and make you more sensitive to those foods.  So for the short-term I would recommend avoiding beef [place] and oats and see if some of your GI symptoms actually improve.  Interestingly you did not have any intolerance to grapes.  Metabolic panel looks good and no sign of pancreatitis.

## 2024-02-10 ENCOUNTER — Ambulatory Visit: Payer: Self-pay

## 2024-02-10 NOTE — Therapy (Unsigned)
 OUTPATIENT PHYSICAL THERAPY THORACOLUMBAR TREATMENT    Patient Name: Anne Mcdonald MRN: [id number] DOB:1960-11-07,63 y.o., female Today's Date: 02/11/2024   END OF SESSION:  PT End of Session - 02/11/24 1149     Visit Number 15    Number of Visits 17    Date for Recertification  02/28/24    Authorization Type [employer]    PT Start Time 1149    PT Stop Time 1228    PT Time Calculation (min) 39 min    Activity Tolerance Patient tolerated treatment well    Behavior During Therapy [facility] for tasks assessed/performed                 Past Medical History:  Diagnosis Date   Arthritis 2020   Fibroid uterus    GERD (gastroesophageal reflux disease)    PUD (peptic ulcer disease)    Past Surgical History:  Procedure Laterality Date   ABDOMINAL HYSTERECTOMY  2017   CESAREAN SECTION     CHOLECYSTECTOMY     TOTAL ABDOMINAL HYSTERECTOMY W/ BILATERAL SALPINGOOPHORECTOMY  08/2016   Patient Active Problem List   Diagnosis Date Noted   Right foot pain 10/31/2023   Lumbar degenerative disc disease 10/28/2023   Chest pain 05/12/2022   Left knee injury 06/15/2021   Metatarsalgia, left foot 08/02/2020   Memory impairment 02/14/2020   Hearing loss 12/10/2019   Irritable bowel syndrome with constipation 03/05/2019   RLS (restless legs syndrome) 07/15/2018   Seborrheic dermatitis 07/15/2018   Primary osteoarthritis of both hands 05/02/2017   Right carpal tunnel syndrome 05/02/2017   Dyshidrotic eczema 12/24/2016   Uterovaginal prolapse 08/27/2016   Bipolar 1 disorder (HCC) 08/02/2016   Moderate episode of recurrent major depressive disorder (HCC) 05/07/2016   Uterine fibroid 09/18/2015   Chronic migraine without aura without status migrainosus, not intractable 05/02/2015   Medication overuse headache 05/02/2015   Headache, menstrual migraine, intractable, with status migrainosus 12/09/2014   GERD (gastroesophageal reflux disease) 11/29/2014   Paroxysmal SVT  (supraventricular tachycardia) 06/10/2012   CIGARETTE SMOKER 08/24/2009   Migraine without aura 09/07/2008   HYPERTRIGLYCERIDEMIA 07/19/2008   Premenstrual tension syndrome 04/24/2006   HOT FLASHES 04/24/2006   Acute left-sided low back pain with left-sided sciatica 04/24/2006    PCP: Alvan Dorothyann BIRCH, MD   REFERRING PROVIDER: Alvan Dorothyann BIRCH, MD   REFERRING DIAG:  M54.50,G89.29 (ICD-10-CM) - Chronic midline low back pain without sciatica  R07.81 (ICD-10-CM) - Rib pain on left side  M54.6 (ICD-10-CM) - Acute midline thoracic back pain    Rationale for Evaluation and Treatment: Rehabilitation  THERAPY DIAG:  Other low back pain  Muscle weakness (generalized)  ONSET DATE: acute on chronic; recent exacerbation August 2025   SUBJECTIVE:  SUBJECTIVE STATEMENT:  Pt reports she is very tired from putting up all her christmas decor this past week. She presents to therapy without wearing her left knee brace, stating it feels a little better.  She states her low back is feeling good.   EVAL: Patient reports her back pain started around 20 years when she herniated her disc and thinks it was from working out. Every few years she would hurt it again. Her most recent exacerbation of pain began at the beginning of August. She was doing a lot of yard work. She thinks it was from sitting pulling weeds where she was having to twist and turn a lot. She underwent physical therapy when she initially hurt her back, which was helpful. The pain is now intermittent about the lower back. Sometimes the pain will radiate down the posterior thighs bilaterally. She also reports a meniscus tear in the Lt knee that was treated conservatively, which will occasionally hurt after strenuous activity or twisting activity. No  numbness/tingling. No red flag symptoms.   PERTINENT HISTORY:  Hearing loss Hysterectomy  Per patient Lt meniscal tear   PAIN:  Are you having pain? Yes: NPRS scale: none currently Pain location: low back Pain description: ache Aggravating factors: twisting maneuver Relieving factors: rest    PRECAUTIONS: None  WEIGHT BEARING RESTRICTIONS: No  FALLS:  Has patient fallen in last 6 months? No  LIVING ENVIRONMENT: Lives with: lives with their family Lives in: House/apartment Stairs: Yes: Internal: flight steps; on left going up and External: 4 steps; none Has following equipment at home: None  OCCUPATION: unemployed  PLOF: Independent  PATIENT GOALS: I want to be healed.    OBJECTIVE:  Note: Objective measures were completed at Evaluation unless otherwise noted.  DIAGNOSTIC FINDINGS:  Lumbar X-ray: IMPRESSION: 1. Similar mild L4-L5 and L5-S1 disc space narrowing. 2. Lower lumbar facet arthropathy.  PATIENT SURVEYS:  Modified Oswestry: 6% disability  COGNITION: Overall cognitive status: Within functional limits for tasks assessed     SENSATION: Not tested  MUSCLE LENGTH: Hamstrings: WNL bilaterally   POSTURE: decreased lumbar lordosis  PALPATION: [place]/TTP bilateral lumbar paraspinals   LUMBAR ROM:   AROM eval  Flexion Full (ache in low back)   Extension WNL   Right lateral flexion WNL LBP  Left lateral flexion WNL LBP  Right rotation WNL  Left rotation WNL   (Blank rows = not tested)  LOWER EXTREMITY ROM:     Active  Right eval Left eval  Hip flexion    Hip extension    Hip abduction    Hip adduction    Hip internal rotation    Hip external rotation    Knee flexion    Knee extension    Ankle dorsiflexion    Ankle plantarflexion    Ankle inversion    Ankle eversion     (Blank rows = not tested)  LOWER EXTREMITY MMT:    MMT Right eval Left eval 01/14/24  Hip flexion 4+ 4 5 bilateral   Hip extension 4- 4- 5 bilateral    Hip abduction 4 4- 4+ Lt; 4+ Rt   Hip adduction     Hip internal rotation     Hip external rotation     Knee flexion     Knee extension     Ankle dorsiflexion     Ankle plantarflexion     Ankle inversion     Ankle eversion      (Blank rows = not tested)  LUMBAR  SPECIAL TESTS:  (-) SLR  FUNCTIONAL TESTS:  Squatting: excessive anterior tibial translation, trunk flexion, LOB   GAIT: Distance walked: 15 ft  Assistive device utilized: None Level of assistance: Complete Independence Comments: no obvious gait abnormalities   [facility] Adult PT Treatment:                                                DATE: 02/11/24  Neuromuscular re-ed: Transversus abdominal activation in hooklying x10, in sidelying  each side discontinued due to pain in greater trochanter  90/90 alternating toe taps x5 for core activation  Therapeutic Activity: Hip hinging in seated with dowel x10 Half dead lifts in seated picking up 1# DB from 6 in step 2x5- got fatigued during last rep felt tension in mid back, did another set 5x after leg press 1 full standing dead lift  Leg press 40# 2x10 verbal cues for hamstring activation Self Care: Pt education on not overdoing it, taking rest breaks throughout day  [facility] Adult PT Treatment:                                                DATE: 02/03/24  Neuromuscular re-ed: Abdominal bracing in seated x10   AROM shoulder retraction x10 for postural strengthening  Therapeutic Activity: Hip hinging in seated x8 with dowel-targetting straight back/decreased thoracic flexion Hip hinging in standing x8 with dowel- target carryover from seated hip hinges  Dead lift x10/picking up block from floor- emphasis on hinging at hips not knees, engaging core and glutes Self Care: Discussed left knee pain/issue, recommend she follow up with her orthopedic doctor  [facility] Adult PT Treatment:                                                DATE: 01/27/24   Manual Therapy: STM to left QL  muscle -increased muscle tightness Neuromuscular re-ed: Bent knee fallout 2 x 10  90/90 toe tap 2 x 8  Single limb balance- Rt leg lasted 12 sec before losing balance, left unable  Heel raises in seated x20 for improved push off during gait  Therapeutic Activity: Ambulation around ortho gym  Self Care: Discussed pt follow up plan with PCP regarding gut health and GI issues  [facility] Adult PT Treatment:                                                DATE: 01/21/24  Neuromuscular re-ed: Bent knee fallout 2 x 10  90/90 toe tap 2 x 8  Dead bug UE only 2 x 10  90/90 leg extension x 10   Self Care: Recommended to keep food log prior to MD visit Vitals assessed and discussed findings with patient BP: 108/74; HR 57 Nutritional considerations and recommendations to discuss potential nutritionist referral at upcoming MD visit    PATIENT EDUCATION:  Education details: see treatment  Person educated: Patient Education method: Explanation, tactile and verbal cues Education comprehension: verbalized  understanding, demonstrated,   HOME EXERCISE PROGRAM: Access Code: [email or web address] URL: [email or web address] Date: 01/14/2024 Prepared by: Lucie Meeter  Exercises - Supine Posterior Pelvic Tilt  - 1 x daily - 7 x weekly - 3 sets - 10 reps - 5 sec  hold - Hooklying Clamshell with Resistance  - 1 x daily - 7 x weekly - 3 sets - 10 reps - Supine March with Posterior Pelvic Tilt  - 1 x daily - 7 x weekly - 3 sets - 10 reps - Standing Anti-Rotation Press with Anchored Resistance  - 1 x daily - 7 x weekly - 2 sets - 10 reps - Standing Hip Hinge  - 1 x daily - 7 x weekly - 2 sets - 10 reps  ASSESSMENT:  CLINICAL IMPRESSION:  Pt presents to skilled therapy without wearing her left knee brace, stating it is feeling better, and reports no low back pain today. Today's session targeted activating core during transfers. Pt challenged in keeping back straight while hinging at hips during 1/2  dead lifts, requiring verbal and tactile cues. Trialed leg press machine first time today, tolerated well. Needed verbal cues for hamstring activation; added pillow at low back secondary to pt reporting pain with machine chair. Pt left session without adverse reactions and no reports of pain.      EVAL: Patient is a 63 y.o. female who was seen today for physical therapy evaluation and treatment for acute on chronic low back pain with recent exacerbation earlier this month attributed to gardening activity. Upon assessment she is noted to have good lumbar AROM with pain elicited with trunk flexion and lateral flexion. She has bilateral hip weakness and core weakness, aberrant lifting mechanics, and postural abnormalities. She will benefit from skilled PT to address the above stated deficits in order to optimize her function and assist in overall pain reduction.      GOALS: Goals reviewed with patient? Yes  SHORT TERM GOALS: Target date: 12/17/2023    Patient will be independent and compliant with initial HEP.   Baseline: see above Goal status: MET  2.  Patient will demonstrate proper lifting mechanics to reduce stress on her back.  Baseline: see above  12/17/23: cues required for proper hip/knee engagement 12/25/23: cues required for hip hinge  01/12/24: proper lift with cues for hip hinge  01/14/24: cues required for hip hinge, foot positioning  Goal status: ongoing    3.  Patient will demonstrate proper posture with seated activity on the floor to reduce stress on her back.  Baseline: demonstrates: long sit with trunk flexed, or criss crossed with trunk flexed  12/17/23: educated on neutral spine and core activation with seated positioning  Goal status: MET on 01/12/24    LONG TERM GOALS: Target date: 02/28/24  Patient will demonstrate proper core activation with standing activity to reduce stress on her back with household tasks.  Baseline: poor core activation 01/14/24: remains  challenged with dynamic supine core stabilization  Goal status: progressing   2.  Patient will demonstrate 5/5 bilateral hip strength to improve stability about the chain with prolonged walking/standing activity.  Baseline: see above Goal status: partially met    3.  Patient will report pain at worst rated as </= 3/10 to reduce current functional limitations.  Baseline: 5 01/14/24: 1 soreness at worst  Goal status: MET  4.  Patient will be independent with advanced home program to assist in management of her chronic condition.  Baseline: see above Goal status: progressing  PLAN:  PT FREQUENCY: 1x/week  PT DURATION: 6 weeks  PLANNED INTERVENTIONS: [id number]- PT Re-evaluation, 97750- Physical Performance Testing, 97110-Therapeutic exercises, 97530- Therapeutic activity, [id number]- Neuromuscular re-education, 97535- Self Care, [id number]- Manual therapy, [id number]- Gait training, [phone]- Aquatic Therapy, [phone] (1-2 muscles), [id number] (3+ muscles)- Dry Needling, Cryotherapy, and Moist heat.  PLAN FOR NEXT SESSION: review and progress HEP prn.core stabilization. Progress hip hinge and kettlebell swings. Standing core strengthening (rotation) , dead lifts, continue leg press; ANTICIPATE DISCHARGE  Lucie Meeter, PT, DPT, ATC 02/11/24 12:54 PM Lavanda Cleverly, SPT 02/11/24 12:54 PM

## 2024-02-11 ENCOUNTER — Ambulatory Visit

## 2024-02-11 DIAGNOSIS — M5459 Other low back pain: Secondary | ICD-10-CM

## 2024-02-11 DIAGNOSIS — M6281 Muscle weakness (generalized): Secondary | ICD-10-CM | POA: Diagnosis not present

## 2024-02-17 ENCOUNTER — Ambulatory Visit: Payer: Self-pay | Admitting: Physical Therapy

## 2024-02-17 ENCOUNTER — Encounter: Payer: Self-pay | Admitting: Physical Therapy

## 2024-02-17 DIAGNOSIS — M5459 Other low back pain: Secondary | ICD-10-CM

## 2024-02-17 DIAGNOSIS — M6281 Muscle weakness (generalized): Secondary | ICD-10-CM | POA: Diagnosis not present

## 2024-02-17 NOTE — Therapy (Signed)
 OUTPATIENT PHYSICAL THERAPY THORACOLUMBAR TREATMENT    Patient Name: Anne Mcdonald MRN: [id number] DOB:01-Dec-1960,63 y.o., female Today's Date: 02/17/2024   END OF SESSION:  PT End of Session - 02/17/24 1101     Visit Number 16    Number of Visits 17    Date for Recertification  02/28/24    Authorization Type [employer]    PT Start Time 1102    PT Stop Time 1143    PT Time Calculation (min) 41 min    Activity Tolerance Patient tolerated treatment well    Behavior During Therapy [facility] for tasks assessed/performed                  Past Medical History:  Diagnosis Date   Arthritis 2020   Fibroid uterus    GERD (gastroesophageal reflux disease)    PUD (peptic ulcer disease)    Past Surgical History:  Procedure Laterality Date   ABDOMINAL HYSTERECTOMY  2017   CESAREAN SECTION     CHOLECYSTECTOMY     TOTAL ABDOMINAL HYSTERECTOMY W/ BILATERAL SALPINGOOPHORECTOMY  08/2016   Patient Active Problem List   Diagnosis Date Noted   Right foot pain 10/31/2023   Lumbar degenerative disc disease 10/28/2023   Chest pain 05/12/2022   Left knee injury 06/15/2021   Metatarsalgia, left foot 08/02/2020   Memory impairment 02/14/2020   Hearing loss 12/10/2019   Irritable bowel syndrome with constipation 03/05/2019   RLS (restless legs syndrome) 07/15/2018   Seborrheic dermatitis 07/15/2018   Primary osteoarthritis of both hands 05/02/2017   Right carpal tunnel syndrome 05/02/2017   Dyshidrotic eczema 12/24/2016   Uterovaginal prolapse 08/27/2016   Bipolar 1 disorder (HCC) 08/02/2016   Moderate episode of recurrent major depressive disorder (HCC) 05/07/2016   Uterine fibroid 09/18/2015   Chronic migraine without aura without status migrainosus, not intractable 05/02/2015   Medication overuse headache 05/02/2015   Headache, menstrual migraine, intractable, with status migrainosus 12/09/2014   GERD (gastroesophageal reflux disease) 11/29/2014   Paroxysmal SVT  (supraventricular tachycardia) 06/10/2012   CIGARETTE SMOKER 08/24/2009   Migraine without aura 09/07/2008   HYPERTRIGLYCERIDEMIA 07/19/2008   Premenstrual tension syndrome 04/24/2006   HOT FLASHES 04/24/2006   Acute left-sided low back pain with left-sided sciatica 04/24/2006    PCP: Alvan Dorothyann BIRCH, MD   REFERRING PROVIDER: Alvan Dorothyann BIRCH, MD   REFERRING DIAG:  M54.50,G89.29 (ICD-10-CM) - Chronic midline low back pain without sciatica  R07.81 (ICD-10-CM) - Rib pain on left side  M54.6 (ICD-10-CM) - Acute midline thoracic back pain    Rationale for Evaluation and Treatment: Rehabilitation  THERAPY DIAG:  Other low back pain  Muscle weakness (generalized)  ONSET DATE: acute on chronic; recent exacerbation August 2025   SUBJECTIVE:  SUBJECTIVE STATEMENT:  Pt reports she smacked her left knee by accident on couch, bruising appeared.  Her daughter is doing ok after her knee surgery (pt is caregiving). Pt is done with all her christmas decor, just needing to clean up the main area, is feeling less stressed. Pt reports no back pain.   EVAL: Patient reports her back pain started around 20 years when she herniated her disc and thinks it was from working out. Every few years she would hurt it again. Her most recent exacerbation of pain began at the beginning of August. She was doing a lot of yard work. She thinks it was from sitting pulling weeds where she was having to twist and turn a lot. She underwent physical therapy when she initially hurt her back, which was helpful. The pain is now intermittent about the lower back. Sometimes the pain will radiate down the posterior thighs bilaterally. She also reports a meniscus tear in the Lt knee that was treated conservatively, which will occasionally  hurt after strenuous activity or twisting activity. No numbness/tingling. No red flag symptoms.   PERTINENT HISTORY:  Hearing loss Hysterectomy  Per patient Lt meniscal tear   PAIN:  Are you having pain? Yes: NPRS scale: none currently Pain location: low back Pain description: ache Aggravating factors: twisting maneuver Relieving factors: rest    PRECAUTIONS: None  WEIGHT BEARING RESTRICTIONS: No  FALLS:  Has patient fallen in last 6 months? No  LIVING ENVIRONMENT: Lives with: lives with their family Lives in: House/apartment Stairs: Yes: Internal: flight steps; on left going up and External: 4 steps; none Has following equipment at home: None  OCCUPATION: unemployed  PLOF: Independent  PATIENT GOALS: I want to be healed.    OBJECTIVE:  Note: Objective measures were completed at Evaluation unless otherwise noted.  DIAGNOSTIC FINDINGS:  Lumbar X-ray: IMPRESSION: 1. Similar mild L4-L5 and L5-S1 disc space narrowing. 2. Lower lumbar facet arthropathy.  PATIENT SURVEYS:  Modified Oswestry: 6% disability  COGNITION: Overall cognitive status: Within functional limits for tasks assessed     SENSATION: Not tested  MUSCLE LENGTH: Hamstrings: WNL bilaterally   POSTURE: decreased lumbar lordosis  PALPATION: [place]/TTP bilateral lumbar paraspinals   LUMBAR ROM:   AROM eval  Flexion Full (ache in low back)   Extension WNL   Right lateral flexion WNL LBP  Left lateral flexion WNL LBP  Right rotation WNL  Left rotation WNL   (Blank rows = not tested)  LOWER EXTREMITY ROM:     Active  Right eval Left eval  Hip flexion    Hip extension    Hip abduction    Hip adduction    Hip internal rotation    Hip external rotation    Knee flexion    Knee extension    Ankle dorsiflexion    Ankle plantarflexion    Ankle inversion    Ankle eversion     (Blank rows = not tested)  LOWER EXTREMITY MMT:    MMT Right eval Left eval 01/14/24 02/17/24  Hip  flexion 4+ 4 5 bilateral  Rt 4+ Lt 4+  Hip extension 4- 4- 5 bilateral  Rt 4+ Lt 4+  Hip abduction 4 4- 4+ Lt; 4+ Rt  Rt 5 Lt 5        Hip adduction      Hip internal rotation      Hip external rotation      Knee flexion      Knee extension  Ankle dorsiflexion      Ankle plantarflexion      Ankle inversion      Ankle eversion       (Blank rows = not tested)  LUMBAR SPECIAL TESTS:  (-) SLR  FUNCTIONAL TESTS:  Squatting: excessive anterior tibial translation, trunk flexion, LOB   GAIT: Distance walked: 15 ft  Assistive device utilized: None Level of assistance: Complete Independence Comments: no obvious gait abnormalities  [facility] Adult PT Treatment:                                                DATE: 02/17/24 Therapeutic Exercise: Back extension stretch x5 MMT reassessment  HEP review  Latt side stretch in standing x10  Neuromuscular re-ed: Posterior pelvic tilt x10 90/90 x8 core engagement  90/90/alternating legs x8 BIL toe taps Therapeutic Activity: Leg press 40# 2x10 verbal cues for hamstring activation with pillow at back side for cushioning Hip hinging in seated with dowel x10  Standing side kicks x10 each side   [facility] Adult PT Treatment:                                                DATE: 02/11/24  Neuromuscular re-ed: Transversus abdominal activation in hooklying x10, in sidelying  each side discontinued due to pain in greater trochanter  90/90 alternating toe taps x5 for core activation  Therapeutic Activity: Hip hinging in seated with dowel x10 Half dead lifts in seated picking up 1# DB from 6 in step 2x5- got fatigued during last rep felt tension in mid back, did another set 5x after leg press 1 full standing dead lift  Leg press 40# 2x10 verbal cues for hamstring activation Self Care: Pt education on not overdoing it, taking rest breaks throughout day  [facility] Adult PT Treatment:                                                DATE:  02/03/24  Neuromuscular re-ed: Abdominal bracing in seated x10   AROM shoulder retraction x10 for postural strengthening  Therapeutic Activity: Hip hinging in seated x8 with dowel-targetting straight back/decreased thoracic flexion Hip hinging in standing x8 with dowel- target carryover from seated hip hinges  Dead lift x10/picking up block from floor- emphasis on hinging at hips not knees, engaging core and glutes Self Care: Discussed left knee pain/issue, recommend she follow up with her orthopedic doctor  [facility] Adult PT Treatment:                                                DATE: 01/27/24   Manual Therapy: STM to left QL muscle -increased muscle tightness Neuromuscular re-ed: Bent knee fallout 2 x 10  90/90 toe tap 2 x 8  Single limb balance- Rt leg lasted 12 sec before losing balance, left unable  Heel raises in seated x20 for improved push off during gait  Therapeutic Activity: Ambulation around ortho gym  Self Care:  Discussed pt follow up plan with PCP regarding gut health and GI issues  [facility] Adult PT Treatment:                                                DATE: 01/21/24  Neuromuscular re-ed: Bent knee fallout 2 x 10  90/90 toe tap 2 x 8  Dead bug UE only 2 x 10  90/90 leg extension x 10   Self Care: Recommended to keep food log prior to MD visit Vitals assessed and discussed findings with patient BP: 108/74; HR 57 Nutritional considerations and recommendations to discuss potential nutritionist referral at upcoming MD visit    PATIENT EDUCATION:  Education details: see treatment  Person educated: Patient Education method: Explanation, tactile and verbal cues Education comprehension: verbalized understanding, demonstrated,   HOME EXERCISE PROGRAM: Access Code: [email or web address] URL: [email or web address] Date: 01/14/2024 Prepared by: Lucie Meeter  Exercises - Supine Posterior Pelvic Tilt  - 1 x daily - 7 x weekly - 3 sets - 10 reps - 5 sec  hold -  Hooklying Clamshell with Resistance  - 1 x daily - 7 x weekly - 3 sets - 10 reps - Supine March with Posterior Pelvic Tilt  - 1 x daily - 7 x weekly - 3 sets - 10 reps - Standing Anti-Rotation Press with Anchored Resistance  - 1 x daily - 7 x weekly - 2 sets - 10 reps - Standing Hip Hinge  - 1 x daily - 7 x weekly - 2 sets - 10 reps  ASSESSMENT:  CLINICAL IMPRESSION:  Continued progressing core activating exercises in supine for carryover into functional activities in both seated and standing. Tolerated core exercises well, however pt stated mild low back pain after hip hinging in seated, thus did not move into performing standing transfers, and incorporated leg press machine for quad/hamstring muscle strengthening instead; tolerated well. Ended with standing side kicks, combined with verbal cues for core activation. Initially needed verbal cues to keep upright trunk during leg lift. Pt left with less low back pain than she reported after the hip hinging.      EVAL: Patient is a 63 y.o. female who was seen today for physical therapy evaluation and treatment for acute on chronic low back pain with recent exacerbation earlier this month attributed to gardening activity. Upon assessment she is noted to have good lumbar AROM with pain elicited with trunk flexion and lateral flexion. She has bilateral hip weakness and core weakness, aberrant lifting mechanics, and postural abnormalities. She will benefit from skilled PT to address the above stated deficits in order to optimize her function and assist in overall pain reduction.      GOALS: Goals reviewed with patient? Yes  SHORT TERM GOALS: Target date: 12/17/2023    Patient will be independent and compliant with initial HEP.   Baseline: see above Goal status: MET  2.  Patient will demonstrate proper lifting mechanics to reduce stress on her back.  Baseline: see above  12/17/23: cues required for proper hip/knee engagement 12/25/23: cues  required for hip hinge  01/12/24: proper lift with cues for hip hinge  01/14/24: cues required for hip hinge, foot positioning  Goal status: MET 02/17/24/  3.  Patient will demonstrate proper posture with seated activity on the floor to reduce stress on her back.  Baseline: demonstrates: long sit with  trunk flexed, or criss crossed with trunk flexed  12/17/23: educated on neutral spine and core activation with seated positioning  Goal status: MET on 01/12/24    LONG TERM GOALS: Target date: 02/28/24  Patient will demonstrate proper core activation with standing activity to reduce stress on her back with household tasks.  Baseline: poor core activation 01/14/24: remains challenged with dynamic supine core stabilization  Goal status: MET 02/17/24  2.  Patient will demonstrate 5/5 bilateral hip strength to improve stability about the chain with prolonged walking/standing activity.  Baseline: see above Goal status: partially met    3.  Patient will report pain at worst rated as </= 3/10 to reduce current functional limitations.  Baseline: 5 01/14/24: 1 soreness at worst  Goal status: MET  4.  Patient will be independent with advanced home program to assist in management of her chronic condition.  Baseline: see above Goal status: progressing    PLAN:  PT FREQUENCY: 1x/week  PT DURATION: 6 weeks  PLANNED INTERVENTIONS: 97164- PT Re-evaluation, 97750- Physical Performance Testing, 97110-Therapeutic exercises, 97530- Therapeutic activity, [id number]- Neuromuscular re-education, 97535- Self Care, [id number]- Manual therapy, [id number]- Gait training, [phone]- Aquatic Therapy, [phone] (1-2 muscles), [id number] (3+ muscles)- Dry Needling, Cryotherapy, and Moist heat.  PLAN FOR NEXT SESSION: review and progress HEP prn.core stabilization. Progress hip hinge and kettlebell swings. Standing core strengthening (rotation) , dead lifts, continue leg press; ANTICIPATE DISCHARGE  Lavanda Cleverly, SPT 02/17/24 5:44  PM

## 2024-02-25 ENCOUNTER — Ambulatory Visit

## 2024-02-25 DIAGNOSIS — M5459 Other low back pain: Secondary | ICD-10-CM | POA: Insufficient documentation

## 2024-02-25 DIAGNOSIS — M6281 Muscle weakness (generalized): Secondary | ICD-10-CM | POA: Insufficient documentation

## 2024-02-25 NOTE — Therapy (Signed)
 OUTPATIENT PHYSICAL THERAPY THORACOLUMBAR TREATMENT  PHYSICAL THERAPY DISCHARGE SUMMARY  Visits from Start of Care: 17  Current functional level related to goals / functional outcomes: All goals met   Remaining deficits: N/A   Education / Equipment: See education below    Patient agrees to discharge. Patient goals were met. Patient is being discharged due to meeting the stated rehab goals.   Patient Name: Anne Mcdonald MRN: [id number] DOB:06/14/1960,63 y.o., female Today's Date: 02/25/2024   END OF SESSION:  PT End of Session - 02/25/24 1101     Visit Number 17    Number of Visits 17    Date for Recertification  02/28/24    Authorization Type [employer]    PT Start Time 1101    PT Stop Time 1145    PT Time Calculation (min) 44 min    Activity Tolerance Patient tolerated treatment well    Behavior During Therapy [facility] for tasks assessed/performed                   Past Medical History:  Diagnosis Date   Arthritis 2020   Fibroid uterus    GERD (gastroesophageal reflux disease)    PUD (peptic ulcer disease)    Past Surgical History:  Procedure Laterality Date   ABDOMINAL HYSTERECTOMY  2017   CESAREAN SECTION     CHOLECYSTECTOMY     TOTAL ABDOMINAL HYSTERECTOMY W/ BILATERAL SALPINGOOPHORECTOMY  08/2016   Patient Active Problem List   Diagnosis Date Noted   Right foot pain 10/31/2023   Lumbar degenerative disc disease 10/28/2023   Chest pain 05/12/2022   Left knee injury 06/15/2021   Metatarsalgia, left foot 08/02/2020   Memory impairment 02/14/2020   Hearing loss 12/10/2019   Irritable bowel syndrome with constipation 03/05/2019   RLS (restless legs syndrome) 07/15/2018   Seborrheic dermatitis 07/15/2018   Primary osteoarthritis of both hands 05/02/2017   Right carpal tunnel syndrome 05/02/2017   Dyshidrotic eczema 12/24/2016   Uterovaginal prolapse 08/27/2016   Bipolar 1 disorder (HCC) 08/02/2016   Moderate episode of recurrent major  depressive disorder (HCC) 05/07/2016   Uterine fibroid 09/18/2015   Chronic migraine without aura without status migrainosus, not intractable 05/02/2015   Medication overuse headache 05/02/2015   Headache, menstrual migraine, intractable, with status migrainosus 12/09/2014   GERD (gastroesophageal reflux disease) 11/29/2014   Paroxysmal SVT (supraventricular tachycardia) 06/10/2012   CIGARETTE SMOKER 08/24/2009   Migraine without aura 09/07/2008   HYPERTRIGLYCERIDEMIA 07/19/2008   Premenstrual tension syndrome 04/24/2006   HOT FLASHES 04/24/2006   Acute left-sided low back pain with left-sided sciatica 04/24/2006    PCP: Alvan Dorothyann BIRCH, MD   REFERRING PROVIDER: Alvan Dorothyann BIRCH, MD   REFERRING DIAG:  M54.50,G89.29 (ICD-10-CM) - Chronic midline low back pain without sciatica  R07.81 (ICD-10-CM) - Rib pain on left side  M54.6 (ICD-10-CM) - Acute midline thoracic back pain    Rationale for Evaluation and Treatment: Rehabilitation  THERAPY DIAG:  Other low back pain  Muscle weakness (generalized)  ONSET DATE: acute on chronic; recent exacerbation August 2025   SUBJECTIVE:  SUBJECTIVE STATEMENT:  Patient reports her back has been feeling good. She continues to do yardwork and has not flared up her back. The knee pain is now intermittent.   EVAL: Patient reports her back pain started around 20 years when she herniated her disc and thinks it was from working out. Every few years she would hurt it again. Her most recent exacerbation of pain began at the beginning of August. She was doing a lot of yard work. She thinks it was from sitting pulling weeds where she was having to twist and turn a lot. She underwent physical therapy when she initially hurt her back, which was helpful. The pain is  now intermittent about the lower back. Sometimes the pain will radiate down the posterior thighs bilaterally. She also reports a meniscus tear in the Lt knee that was treated conservatively, which will occasionally hurt after strenuous activity or twisting activity. No numbness/tingling. No red flag symptoms.   PERTINENT HISTORY:  Hearing loss Hysterectomy  Per patient Lt meniscal tear   PAIN:  Are you having pain? No    PRECAUTIONS: None  WEIGHT BEARING RESTRICTIONS: No  FALLS:  Has patient fallen in last 6 months? No  LIVING ENVIRONMENT: Lives with: lives with their family Lives in: House/apartment Stairs: Yes: Internal: flight steps; on left going up and External: 4 steps; none Has following equipment at home: None  OCCUPATION: unemployed  PLOF: Independent  PATIENT GOALS: I want to be healed.    OBJECTIVE:  Note: Objective measures were completed at Evaluation unless otherwise noted.  DIAGNOSTIC FINDINGS:  Lumbar X-ray: IMPRESSION: 1. Similar mild L4-L5 and L5-S1 disc space narrowing. 2. Lower lumbar facet arthropathy.  PATIENT SURVEYS:  Modified Oswestry: 6% disability  COGNITION: Overall cognitive status: Within functional limits for tasks assessed     SENSATION: Not tested  MUSCLE LENGTH: Hamstrings: WNL bilaterally   POSTURE: decreased lumbar lordosis  PALPATION: [place]/TTP bilateral lumbar paraspinals   LUMBAR ROM:   AROM eval  Flexion Full (ache in low back)   Extension WNL   Right lateral flexion WNL LBP  Left lateral flexion WNL LBP  Right rotation WNL  Left rotation WNL   (Blank rows = not tested)  LOWER EXTREMITY ROM:     Active  Right eval Left eval  Hip flexion    Hip extension    Hip abduction    Hip adduction    Hip internal rotation    Hip external rotation    Knee flexion    Knee extension    Ankle dorsiflexion    Ankle plantarflexion    Ankle inversion    Ankle eversion     (Blank rows = not  tested)  LOWER EXTREMITY MMT:    MMT Right eval Left eval 01/14/24 02/17/24 02/25/24  Hip flexion 4+ 4 5 bilateral  Rt 4+ Lt 4+ 5 bilateral   Hip extension 4- 4- 5 bilateral  Rt 4+ Lt 4+ 5 bilateral   Hip abduction 4 4- 4+ Lt; 4+ Rt  Rt 5 Lt 5          Hip adduction       Hip internal rotation       Hip external rotation       Knee flexion       Knee extension       Ankle dorsiflexion       Ankle plantarflexion       Ankle inversion       Ankle eversion        (  Blank rows = not tested)  LUMBAR SPECIAL TESTS:  (-) SLR  FUNCTIONAL TESTS:  Squatting: excessive anterior tibial translation, trunk flexion, LOB   GAIT: Distance walked: 15 ft  Assistive device utilized: None Level of assistance: Complete Independence Comments: no obvious gait abnormalities  [facility] Adult PT Treatment:                                                DATE: 02/25/24 Therapeutic Exercise: LE MMT Reviewed and updated HEP discussing frequency, sets, reps, and ways to progress independently  Self Care: Lengthy discussion reviewing proper mechanics with household and yardwork activity Discussed positions/motions to avoid with yardwork activity Discussed gym activity to avoid should she seek gym membership Discussed proper mechanics on row machine to utilize at home    [facility] Adult PT Treatment:                                                DATE: 02/17/24 Therapeutic Exercise: Back extension stretch x5 MMT reassessment  HEP review  Latt side stretch in standing x10  Neuromuscular re-ed: Posterior pelvic tilt x10 90/90 x8 core engagement  90/90/alternating legs x8 BIL toe taps Therapeutic Activity: Leg press 40# 2x10 verbal cues for hamstring activation with pillow at back side for cushioning Hip hinging in seated with dowel x10  Standing side kicks x10 each side   [facility] Adult PT Treatment:                                                DATE: 02/11/24  Neuromuscular re-ed: Transversus abdominal  activation in hooklying x10, in sidelying  each side discontinued due to pain in greater trochanter  90/90 alternating toe taps x5 for core activation  Therapeutic Activity: Hip hinging in seated with dowel x10 Half dead lifts in seated picking up 1# DB from 6 in step 2x5- got fatigued during last rep felt tension in mid back, did another set 5x after leg press 1 full standing dead lift  Leg press 40# 2x10 verbal cues for hamstring activation Self Care: Pt education on not overdoing it, taking rest breaks throughout day  [facility] Adult PT Treatment:                                                DATE: 02/03/24  Neuromuscular re-ed: Abdominal bracing in seated x10   AROM shoulder retraction x10 for postural strengthening  Therapeutic Activity: Hip hinging in seated x8 with dowel-targetting straight back/decreased thoracic flexion Hip hinging in standing x8 with dowel- target carryover from seated hip hinges  Dead lift x10/picking up block from floor- emphasis on hinging at hips not knees, engaging core and glutes Self Care: Discussed left knee pain/issue, recommend she follow up with her orthopedic doctor   PATIENT EDUCATION:  Education details: see treatment  Person educated: Patient Education method: Explanation, handout Education comprehension: verbalized understanding, demonstrated,   HOME EXERCISE PROGRAM: Access Code: [email or web address] URL: [email or web address] Date:  02/25/2024 Prepared by: Lucie Meeter  Exercises - Supine Posterior Pelvic Tilt  - 1 x daily - 2 x weekly - 3 sets - 10 reps - 5 sec  hold - Hooklying Clamshell with Resistance  - 1 x daily - 2 x weekly - 3 sets - 10 reps - Supine March with Posterior Pelvic Tilt  - 1 x daily - 2 x weekly - 3 sets - 10 reps - Standing Anti-Rotation Press with Anchored Resistance  - 1 x daily - 2 x weekly - 2 sets - 10 reps - Standing Hip Hinge  - 1 x daily - 2 x weekly - 2 sets - 10 reps - Seated Diagonal Chop with  Medicine Ball  - 1 x daily - 2 x weekly - 2 sets - 10 reps  ASSESSMENT:  CLINICAL IMPRESSION:  Patient has made excellent progress in PT reporting significant improvement in her low back pain with daily activity. She has met all established functional goals demonstrating improvements in strength, lifting mechanics, and posture. She is independent with advanced home program and is appropriate for discharge at this time with patient in agreement with this plan.      EVAL: Patient is a 63 y.o. female who was seen today for physical therapy evaluation and treatment for acute on chronic low back pain with recent exacerbation earlier this month attributed to gardening activity. Upon assessment she is noted to have good lumbar AROM with pain elicited with trunk flexion and lateral flexion. She has bilateral hip weakness and core weakness, aberrant lifting mechanics, and postural abnormalities. She will benefit from skilled PT to address the above stated deficits in order to optimize her function and assist in overall pain reduction.      GOALS: Goals reviewed with patient? Yes  SHORT TERM GOALS: Target date: 12/17/2023    Patient will be independent and compliant with initial HEP.   Baseline: see above Goal status: MET  2.  Patient will demonstrate proper lifting mechanics to reduce stress on her back.  Baseline: see above  12/17/23: cues required for proper hip/knee engagement 12/25/23: cues required for hip hinge  01/12/24: proper lift with cues for hip hinge  01/14/24: cues required for hip hinge, foot positioning  Goal status: MET 02/17/24/  3.  Patient will demonstrate proper posture with seated activity on the floor to reduce stress on her back.  Baseline: demonstrates: long sit with trunk flexed, or criss crossed with trunk flexed  12/17/23: educated on neutral spine and core activation with seated positioning  Goal status: MET on 01/12/24    LONG TERM GOALS: Target date:  02/28/24  Patient will demonstrate proper core activation with standing activity to reduce stress on her back with household tasks.  Baseline: poor core activation 01/14/24: remains challenged with dynamic supine core stabilization  Goal status: MET 02/17/24  2.  Patient will demonstrate 5/5 bilateral hip strength to improve stability about the chain with prolonged walking/standing activity.  Baseline: see above Goal status: MET  3.  Patient will report pain at worst rated as </= 3/10 to reduce current functional limitations.  Baseline: 5 01/14/24: 1 soreness at worst  Goal status: MET  4.  Patient will be independent with advanced home program to assist in management of her chronic condition.  Baseline: see above Goal status: MET    PLAN:  PT FREQUENCY: 1x/week  PT DURATION: 6 weeks  PLANNED INTERVENTIONS: 97164- PT Re-evaluation, 97750- Physical Performance Testing, 97110-Therapeutic exercises, 97530- Therapeutic activity, [id number]-  Neuromuscular re-education, [id number]- Self Care, 02859- Manual therapy, [id number]- Gait training, [id number]- Aquatic Therapy, [phone] (1-2 muscles), 20561 (3+ muscles)- Dry Needling, Cryotherapy, and Moist heat.    Lithzy Bernard, PT, DPT, ATC 02/25/24 11:57 AM

## 2024-03-19 ENCOUNTER — Ambulatory Visit: Payer: Self-pay

## 2024-03-19 NOTE — Telephone Encounter (Signed)
 FYI Only or Action Required?: FYI only for provider: appointment scheduled on 04/05/24.  Patient was last seen in primary care on 01/22/2024 by Alvan Dorothyann BIRCH, MD.  Called Nurse Triage reporting Fatigue and Headache.  Symptoms began a week ago.  Interventions attempted: OTC medications: OTC pain medicine.  Symptoms are: gradually worsening.  Triage Disposition: See PCP Within 2 Weeks  Patient/caregiver understands and will follow disposition?: Yes  Reason for Disposition  Headache is a chronic symptom (recurrent or ongoing AND present > 4 weeks)  Answer Assessment - Initial Assessment Questions Patient states that she has a pressure at the back of her head with a spot that's tender to touch. She reports fatigue and increased problems with balance. She states this has been an ongoing issue for her, but this has worsened in the last week. Denies numbness/tingling, nausea/vomiting, fevers. Office visit advised. Scheduled 04/05/24, no sooner appts available and prefers to see her PCP.   1. LOCATION: Where does it hurt?      Back of head  2. ONSET: When did the headache start? (e.g., minutes, hours, days)      Worsened over the last week  3. PATTERN: Does the pain come and go, or has it been constant since it started?     Constant  4. SEVERITY: How bad is the pain? and What does it keep you from doing?  (e.g., Scale 1-10; mild, moderate, or severe)     Currently moderate, but 8/10 at its worst  5. RECURRENT SYMPTOM: Have you ever had headaches before? If Yes, ask: When was the last time? and What happened that time?      Yes, patient states this has been ongoing  6. CAUSE: What do you think is causing the headache?     Unsure  7. MIGRAINE: Have you been diagnosed with migraine headaches? If Yes, ask: Is this headache similar?      No  8. HEAD INJURY: Has there been any recent injury to your head?      No  9. OTHER SYMPTOMS: Do you have any other  symptoms? (e.g., fever, stiff neck, eye pain, sore throat, cold symptoms)     Fatigue, feels her balance has worsened  10. PREGNANCY: Is there any chance you are pregnant? When was your last menstrual period?       NA  Protocols used: [facility]  Copied from CRM [id number]. Topic: Clinical - Red Word Triage >> Mar 19, 2024 11:51 AM Charlet HERO wrote: Red Word that prompted transfer to Nurse Triage: Patient is stating that the back of her head hurts and she is feeling fatigued. Dr Geraline

## 2024-03-22 ENCOUNTER — Ambulatory Visit: Admitting: Family Medicine

## 2024-03-22 ENCOUNTER — Encounter: Payer: Self-pay | Admitting: Family Medicine

## 2024-03-22 VITALS — BP 100/61 | HR 73 | Ht 64.0 in | Wt 138.0 lb

## 2024-03-22 DIAGNOSIS — M778 Other enthesopathies, not elsewhere classified: Secondary | ICD-10-CM | POA: Diagnosis not present

## 2024-03-22 DIAGNOSIS — H6121 Impacted cerumen, right ear: Secondary | ICD-10-CM

## 2024-03-22 DIAGNOSIS — K581 Irritable bowel syndrome with constipation: Secondary | ICD-10-CM

## 2024-03-22 MED ORDER — LINACLOTIDE 290 MCG PO CAPS: 290.0000 ug | ORAL_CAPSULE | Freq: Every day | ORAL | 1 refills | Status: AC

## 2024-03-22 MED ORDER — CYCLOBENZAPRINE HCL 10 MG PO TABS: 10.0000 mg | ORAL_TABLET | Freq: Two times a day (BID) | ORAL | 1 refills | Status: AC | PRN

## 2024-03-22 NOTE — Progress Notes (Signed)
 Pt reports that she has been experiencing pressure/pain at the back of her head. She stated that years ago she was getting injections for migraines and questions if this is what is causing this pain.

## 2024-03-22 NOTE — Assessment & Plan Note (Signed)
 She would like to restart Linzess .  New prescription sent to pharmacy.

## 2024-03-22 NOTE — Progress Notes (Signed)
 "  Established Patient Office Visit  Patient ID: Anne Mcdonald, female    DOB: 23-May-1960  Age: 63 y.o. MRN: [id number] PCP: Clayton Quale, MD  No chief complaint on file.   Subjective:     HPI  Discussed the use of AI scribe software for clinical note transcription with the patient, who gave verbal consent to proceed.  History of Present Illness Anne Mcdonald is a 63 year old female who presents with chronic pain at the back of her head and neck.  Occipital and neck pain - Chronic pain localized to the back of the head, particularly on the left side, present for years - Pain described as a focal spot that is tender to palpation on the back of the head on the left side - Persistent pain with increased intensity over the past week - Associated sensation of pressure - Pain sometimes radiates upwards and is triggered by head movements, causing shooting pain through the head - Pain is distinct from migraine headaches - History of childhood trauma to the back of the head, possibly contributing to sensitivity in the area - History of neck issues  Migraine history and management - History of migraines, previously treated with multiple head injections years ago - Currently manages pain with over-the-counter pain relievers and migraine medication, which causes significant fatigue - Currently taking Tylenol  for pain relief due to multiple ulcers preventing use of other medications - Previous use of cyclobenzaprine  for back pain  Ear symptoms - Rattling sensation in the right ear, which is the side that is bothersome - No recent trauma to the head  Back pain and physical therapy - Recent completion of physical therapy for back pain with improvement  Associated symptoms - No fever or other systemic symptoms     ROS    Objective:     BP 100/61   Pulse 73   Ht 5' 4 (1.626 m)   Wt 138 lb (62.6 kg)   LMP 07/17/2016   SpO2 100%   BMI 23.69 kg/m     Physical Exam   No results found for any visits on 03/22/24.    The 10-year ASCVD risk score (Arnett DK, et al., 2019) is: 5.5%    Assessment & Plan:   Problem List Items Addressed This Visit       Digestive   Irritable bowel syndrome with constipation   She would like to restart Linzess .  New prescription sent to pharmacy.      Relevant Medications   RABEprazole (ACIPHEX) 20 MG tablet   linaclotide  (LINZESS ) 290 MCG CAPS capsule   Other Visit Diagnoses       Occipital tendonitis    -  Primary   Relevant Medications   cyclobenzaprine  (FLEXERIL ) 10 MG tablet     Hearing loss of right ear due to cerumen impaction           Assessment and Plan Assessment & Plan Cervicalgia with muscle spasm Chronic cervicalgia with occipital muscle spasm, likely due to muscle attachment and possible occipital nerve irritation. Differential includes occipital neuralgia. - Prescribed cyclobenzaprine  for muscle relaxation. - Provided neck stretches for home exercises. - Consider physical therapy if symptoms persist. - Okay to continue Tylenol  for pain relief as well  Impacted cerumen, right ear Impacted cerumen obstructing view of the eardrum. - Performed ear irrigation to remove impacted cerumen from the right ear.  IBS with constipation Chronic constipation exacerbated by discontinuation of Lissess. No gallbladder may affect bowel movements.  Indication: Cerumen impaction of the ear(s) Medical necessity statement: On physical examination, cerumen impairs clinically significant portions of the external auditory canal, and tympanic membrane. Noted obstructive, copious cerumen that cannot be removed without magnification and instrumentations Consent: Discussed benefits and risks of procedure and verbal consent obtained Procedure: Patient was prepped for the procedure. Utilized an otoscope to assess and take note of the ear canal, the tympanic membrane, and the presence, amount,  and placement of the cerumen. Gentle water irrigation and soft plastic curette was utilized to remove cerumen.  Post procedure examination: shows cerumen was completely removed. Patient tolerated procedure well. The patient is made aware that they may experience temporary vertigo, temporary hearing loss, and temporary discomfort. If these symptom last for more than 24 hours to call the clinic or proceed to the ED.     No follow-ups on file.    Dorothyann Byars, MD [facility] at [facility]   "

## 2024-03-26 ENCOUNTER — Other Ambulatory Visit: Payer: Self-pay | Admitting: Family Medicine

## 2024-03-26 DIAGNOSIS — I471 Supraventricular tachycardia, unspecified: Secondary | ICD-10-CM

## 2024-03-26 DIAGNOSIS — E781 Pure hyperglyceridemia: Secondary | ICD-10-CM

## 2024-04-05 ENCOUNTER — Ambulatory Visit: Admitting: Family Medicine
